# Patient Record
Sex: Female | Born: 1955 | Hispanic: Yes | Marital: Married | Smoking: Never smoker
Health system: Southern US, Community
[De-identification: ages and names within clinical notes are randomized; demographics above are authoritative.]

## PROBLEM LIST (undated history)

## (undated) DIAGNOSIS — E119 Type 2 diabetes mellitus without complications: Secondary | ICD-10-CM

## (undated) DIAGNOSIS — E7800 Pure hypercholesterolemia, unspecified: Secondary | ICD-10-CM

## (undated) DIAGNOSIS — G510 Bell's palsy: Secondary | ICD-10-CM

## (undated) DIAGNOSIS — F419 Anxiety disorder, unspecified: Secondary | ICD-10-CM

## (undated) HISTORY — PX: DILATION AND CURETTAGE OF UTERUS: SHX78

## (undated) HISTORY — PX: APPENDECTOMY: SHX54

## (undated) HISTORY — PX: CHOLECYSTECTOMY: SHX55

---

## 2006-07-09 ENCOUNTER — Emergency Department: Payer: Self-pay | Admitting: Emergency Medicine

## 2006-07-12 ENCOUNTER — Ambulatory Visit: Payer: Self-pay | Admitting: Emergency Medicine

## 2007-01-05 ENCOUNTER — Emergency Department: Payer: Self-pay | Admitting: Emergency Medicine

## 2007-06-06 ENCOUNTER — Emergency Department: Payer: Self-pay | Admitting: Emergency Medicine

## 2011-11-12 ENCOUNTER — Encounter: Payer: Self-pay | Admitting: Nurse Practitioner

## 2011-11-23 ENCOUNTER — Encounter: Payer: Self-pay | Admitting: Nurse Practitioner

## 2011-12-23 ENCOUNTER — Encounter: Payer: Self-pay | Admitting: Nurse Practitioner

## 2012-01-23 ENCOUNTER — Encounter: Payer: Self-pay | Admitting: Nurse Practitioner

## 2013-11-09 DIAGNOSIS — E785 Hyperlipidemia, unspecified: Secondary | ICD-10-CM | POA: Insufficient documentation

## 2013-11-09 DIAGNOSIS — E1169 Type 2 diabetes mellitus with other specified complication: Secondary | ICD-10-CM | POA: Insufficient documentation

## 2013-11-09 DIAGNOSIS — IMO0002 Reserved for concepts with insufficient information to code with codable children: Secondary | ICD-10-CM | POA: Insufficient documentation

## 2013-11-09 DIAGNOSIS — E1140 Type 2 diabetes mellitus with diabetic neuropathy, unspecified: Secondary | ICD-10-CM | POA: Insufficient documentation

## 2013-11-12 ENCOUNTER — Emergency Department: Payer: Self-pay | Admitting: Emergency Medicine

## 2014-10-22 DIAGNOSIS — F325 Major depressive disorder, single episode, in full remission: Secondary | ICD-10-CM | POA: Insufficient documentation

## 2015-05-20 ENCOUNTER — Encounter: Payer: Commercial Indemnity | Attending: Surgery | Admitting: Surgery

## 2015-05-20 DIAGNOSIS — E1140 Type 2 diabetes mellitus with diabetic neuropathy, unspecified: Secondary | ICD-10-CM | POA: Insufficient documentation

## 2015-05-20 DIAGNOSIS — L97312 Non-pressure chronic ulcer of right ankle with fat layer exposed: Secondary | ICD-10-CM | POA: Insufficient documentation

## 2015-05-20 DIAGNOSIS — E11622 Type 2 diabetes mellitus with other skin ulcer: Secondary | ICD-10-CM | POA: Diagnosis not present

## 2015-05-20 DIAGNOSIS — F329 Major depressive disorder, single episode, unspecified: Secondary | ICD-10-CM | POA: Diagnosis not present

## 2015-05-20 DIAGNOSIS — M1990 Unspecified osteoarthritis, unspecified site: Secondary | ICD-10-CM | POA: Diagnosis not present

## 2015-05-20 DIAGNOSIS — I83013 Varicose veins of right lower extremity with ulcer of ankle: Secondary | ICD-10-CM | POA: Insufficient documentation

## 2015-05-20 DIAGNOSIS — E780 Pure hypercholesterolemia: Secondary | ICD-10-CM | POA: Diagnosis not present

## 2015-05-20 NOTE — Progress Notes (Addendum)
JNAI, SNELLGROVE ([id number]) Visit Report for 05/20/2015 Chief Complaint Document Details Patient Name: Tammy Brewer, Tammy T. Date of Service: 05/20/2015 8:15 AM Medical Record Number: [id number] Patient Account Number: [id number] Date of Birth/Sex: 12-14-1955 (59 y.o. Female) Treating RN: Curtis Sites Primary Care Physician: Einar Crow Other Clinician: Referring Physician: Einar Crow Treating Physician/Extender: Rudene Re in Treatment: 0 Information Obtained from: Patient Chief Complaint Patient presents for treatment of an open ulcer due to venous insufficiency. The patient who has diabetes mellitus has had an ulceration on the right ankle for about 3 months Electronic Signature(Tammy) Signed: 05/20/2015 9:15:39 AM By: Evlyn Kanner MD, FACS Entered By: Evlyn Kanner on 05/20/2015 09:15:39 Brull, Esmae T. ([id number]) -------------------------------------------------------------------------------- Debridement Details Patient Name: Tammy Broom T. Date of Service: 05/20/2015 8:15 AM Medical Record Number: [id number] Patient Account Number: [id number] Date of Birth/Sex: 13-Dec-1955 (59 y.o. Female) Treating RN: Curtis Sites Primary Care Physician: Einar Crow Other Clinician: Referring Physician: Einar Crow Treating Physician/Extender: Rudene Re in Treatment: 0 Debridement Performed for Wound #1 Right,Medial Malleolus Assessment: Performed By: Physician Tristan Schroeder., MD Debridement: Debridement Pre-procedure Yes Verification/Time Out Taken: Start Time: 09:05 Pain Control: Other : lidiocaine 4% Level: Skin/Subcutaneous Tissue Total Area Debrided (L x 2.5 (cm) x 1.2 (cm) = 3 (cm) W): Tissue and other Viable, Non-Viable, Exudate, Fibrin/Slough, Subcutaneous material debrided: Instrument: Curette Bleeding: Minimum Hemostasis Achieved: Pressure End Time: 09:10 Procedural Pain: 0 Post Procedural Pain: 0 Response to Treatment:  Procedure was tolerated well Post Debridement Measurements of Total Wound Length: (cm) 2.5 Width: (cm) 1.2 Depth: (cm) 0.2 Volume: (cm) 0.471 Post Procedure Diagnosis Same as Pre-procedure Electronic Signature(Tammy) Signed: 05/20/2015 9:15:09 AM By: Evlyn Kanner MD, FACS Signed: 05/20/2015 5:00:42 PM By: Curtis Sites Entered By: Evlyn Kanner on 05/20/2015 09:15:09 Tammy Brewer, Tammy T. ([id number]) -------------------------------------------------------------------------------- HPI Details Patient Name: Tammy Broom T. Date of Service: 05/20/2015 8:15 AM Medical Record Number: [id number] Patient Account Number: [id number] Date of Birth/Sex: 1956-05-28 (59 y.o. Female) Treating RN: Curtis Sites Primary Care Physician: Einar Crow Other Clinician: Referring Physician: Einar Crow Treating Physician/Extender: Rudene Re in Treatment: 0 History of Present Illness Location: ulcer on the right ankle Quality: Patient reports experiencing burning to affected area(Tammy). Severity: Patient states wound are getting worse. Duration: Patient has had the wound for > 3 months prior to seeking treatment at the wound center Timing: Pain in wound is Intermittent (comes and goes Context: The wound appeared gradually over time Modifying Factors: Patient'Tammy employment requires standing for long periods of time Associated Signs and Symptoms: Patient reports presence of swelling HPI Description: 59 year old patient who comes from Dr. Alva Garnet office with the chief complaints of having a foot ulcer with redness for about 2 weeks. The patient has a past medical history of diabetes mellitus type 2 which is uncontrolled with neuropathy, hypercholesterolemia, osteoarthritis, major depression and has had a past history of appendectomy, cholecystectomy and a DandC. She has never been a smoker and her last hemoglobin A1c done in February 2016 was 8.2. As per our office note she  has recently seen podiatry and also has some vascular workup done recently and saw Dr. Gilda Crease on 04/18/2015. clinically they found varicose veins of the lower extremity with both ulcers of the ankle and inflammation. There was an active ulcer on the right lower extremity. Lower extremity venous duplex exam was recommended and the patient was asked to wear compression stockings. Venous duplex studies are still pending. Electronic Signature(Tammy) Signed: 05/20/2015 9:16:27 AM By: Evlyn Kanner MD, FACS Previous  Signature: 05/20/2015 8:53:36 AM Version By: Evlyn Kanner MD, FACS Previous Signature: 05/20/2015 8:26:56 AM Version By: Evlyn Kanner MD, FACS Entered By: Evlyn Kanner on 05/20/2015 09:16:27 Tammy Brewer, Tammy Brewer ([id number]) -------------------------------------------------------------------------------- Physical Exam Details Patient Name: Tammy Brewer, Tammy T. Date of Service: 05/20/2015 8:15 AM Medical Record Number: [id number] Patient Account Number: [id number] Date of Birth/Sex: 1955/10/31 (59 y.o. Female) Treating RN: Curtis Sites Primary Care Physician: Einar Crow Other Clinician: Referring Physician: Einar Crow Treating Physician/Extender: Rudene Re in Treatment: 0 Constitutional . Pulse regular. Respirations normal and unlabored. Afebrile. . Eyes Nonicteric. Reactive to light. Ears, Nose, Mouth, and Throat Lips, teeth, and gums WNL.Marland Kitchen Moist mucosa without lesions . Neck supple and nontender. No palpable supraclavicular or cervical adenopathy. Normal sized without goiter. Respiratory WNL. No retractions.. Breath sounds WNL, No rubs, rales, rhonchi, or wheeze.. Chest Breasts symmetical and no nipple discharge.. Breast tissue WNL, no masses, lumps, or tenderness.. Gastrointestinal (GI) Abdomen without masses or tenderness.. No liver or spleen enlargement or tenderness.. Lymphatic No adneopathy. No adenopathy. No adenopathy. Musculoskeletal Adexa without  tenderness or enlargement.. Digits and nails w/o clubbing, cyanosis, infection, petechiae, ischemia, or inflammatory conditions.. Integumentary (Hair, Skin) No suspicious lesions. No crepitus or fluctuance. No peri-wound warmth or erythema. No masses.Marland Kitchen Psychiatric Judgement and insight Intact.. No evidence of depression, anxiety, or agitation.. Notes She has got varicose veins which are visible on the right lower extremity and on the right ankle medially she has got an area of ulceration which has some satellite lesions and there is subcutaneous tissue at the depth with slough which nature up to begin with a curette. Electronic Signature(Tammy) Signed: 05/20/2015 9:17:25 AM By: Evlyn Kanner MD, FACS Entered By: Evlyn Kanner on 05/20/2015 09:17:25 Tammy Brewer, Tammy Brewer ([id number]) -------------------------------------------------------------------------------- Physician Orders Details Patient Name: Tammy Broom T. Date of Service: 05/20/2015 8:15 AM Medical Record Number: [id number] Patient Account Number: [id number] Date of Birth/Sex: [birth year] (59 y.o. Female) Treating RN: Huel Coventry Primary Care Physician: Einar Crow Other Clinician: Referring Physician: Einar Crow Treating Physician/Extender: Rudene Re in Treatment: 0 Verbal / Phone Orders: Yes Clinician: Huel Coventry Read Back and Verified: Yes Diagnosis Coding Wound Cleansing Wound #1 Right,Medial Malleolus o Clean wound with Normal Saline. Anesthetic Wound #1 Right,Medial Malleolus o Topical Lidocaine 4% cream applied to wound bed prior to debridement Primary Wound Dressing Wound #1 Right,Medial Malleolus o Aquacel Ag Secondary Dressing Wound #1 Right,Medial Malleolus o ABD pad Dressing Change Frequency Wound #1 Right,Medial Malleolus o Change dressing every week o Other: - Thursday Follow-up Appointments Wound #1 Right,Medial Malleolus o Return Appointment in 1 week. o Nurse Visit  as needed Edema Control Wound #1 Right,Medial Malleolus o Unna Boot to Right Lower Extremity o Support Garment 20-30 mm/Hg pressure to: - Patient to acquire compression garment Additional Orders / Instructions Wound #1 Right,Medial Malleolus o Increase protein intake. EMIRETH, COCKERHAM ([id number]) Services and Therapies o Venous Duplex Doppler - Patient to follow-up with [facility] oooo Electronic Signature(Tammy) Signed: 05/20/2015 4:29:43 PM By: Evlyn Kanner MD, FACS Signed: 05/21/2015 5:26:34 PM By: Elliot Gurney RN, [name], [name], [name] Entered By: Elliot Gurney, RN, [name], Kim on 05/20/2015 09:16:27 Tammy Brewer, Tammy Brewer ([id number]) -------------------------------------------------------------------------------- Problem List Details Patient Name: Tammy Brewer, Tammy T. Date of Service: 05/20/2015 8:15 AM Medical Record Number: [id number] Patient Account Number: [id number] Date of Birth/Sex: Jul 11, 1956 (59 y.o. 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 ([id number]) -------------------------------------------------------------------------------- Progress Note Details Patient Name: Tammy Brewer, Tammy T. Date of Service: 05/20/2015 8:15 AM Medical Record Number: [id number] Patient Account Number:  [id number] Date of Birth/Sex: [birth year] (59 y.o. Female) Treating RN: Curtis Sites Primary Care Physician: Einar Crow Other Clinician: Referring Physician: Einar Crow Treating Physician/Extender: Rudene Re in Treatment: 0 Subjective Chief Complaint Information obtained from Patient Patient presents for treatment of an open ulcer due to venous insufficiency. The patient who has diabetes mellitus has had an ulceration on the right ankle for about 3 months History of Present Illness (HPI) The following HPI elements were documented for the patient'Tammy wound: Location: ulcer on the right ankle Quality: Patient reports experiencing burning to affected area(Tammy). Severity: Patient states wound are getting worse. Duration: Patient has had the wound for > 3 months prior to seeking treatment at the wound center Timing: Pain in wound is Intermittent (comes and goes Context: The wound appeared gradually over time Modifying Factors: Patient'Tammy employment requires standing for long periods of time Associated Signs and Symptoms: Patient reports presence of swelling 59 year old patient who comes from Dr. Gaynell Face Anderson'Tammy office with the chief complaints of having a foot ulcer with redness for about 2 weeks. The patient has a past medical history of diabetes mellitus type 2 which is uncontrolled with neuropathy, hypercholesterolemia, osteoarthritis, major depression and has had a past history of appendectomy, cholecystectomy and a DandC. She has never been a smoker and her last hemoglobin A1c done in February 2016 was 8.2. As per our office note she has recently seen podiatry and also has some vascular workup done recently and saw Dr. Gilda Crease on 04/18/2015. clinically they found varicose veins of the lower extremity with both ulcers of the ankle and inflammation. There was an active ulcer on the right lower extremity. Lower extremity venous duplex exam was recommended and the  patient was asked to wear compression stockings. Venous duplex studies are still pending. Wound History Patient presents with 1 open wound that has been present for approximately 3 months. Patient has been treating wound in the following manner: dry dressing. Laboratory tests have been performed in the last month. Patient reportedly has not tested positive for an antibiotic resistant organism. Patient reportedly has not tested positive for osteomyelitis. Patient reportedly has not had testing performed to evaluate circulation in the legs. Patient History Information obtained from Patient. KELBY, LOTSPEICH ([id number]) Allergies No Known Allergies Family History No family history of Cancer, Diabetes, Heart Disease, Hereditary Spherocytosis, Hypertension, Kidney Disease, Lung Disease, Seizures, Stroke, Thyroid Problems, Tuberculosis. Social History Never smoker, Marital Status - Married, Alcohol Use - Never, Drug Use - No History, Caffeine Use - Never. Medical History Endocrine Patient has history of Type II Diabetes Musculoskeletal Patient has history of Osteoarthritis Neurologic Patient has history of Neuropathy Oncologic Denies history of Received Chemotherapy, Received Radiation Patient is treated with Oral Agents. Medical And Surgical History Notes Cardiovascular hypercholesterolemia Review of Systems (ROS) Constitutional Symptoms (General Health) The patient has no complaints or symptoms. Eyes The patient has no complaints or symptoms. Ear/Nose/Mouth/Throat The patient has no  complaints or symptoms. Hematologic/Lymphatic The patient has no complaints or symptoms. Respiratory The patient has no complaints or symptoms. Cardiovascular The patient has no complaints or symptoms. Gastrointestinal The patient has no complaints or symptoms. Genitourinary The patient has no complaints or symptoms. Immunological The patient has no complaints or symptoms. Integumentary  (Skin) The patient has no complaints or symptoms. Neurologic The patient has no complaints or symptoms. Oncologic Tammy Brewer, BALON T. ([id number]) The patient has no complaints or symptoms. Psychiatric The patient has no complaints or symptoms. Medications tramadol 50 mg tablet oral 1 1 tablet oral every six hours as needed for pain metformin 500 mg tablet oral 1 1 tablet oral twice daily Objective Constitutional Pulse regular. Respirations normal and unlabored. Afebrile. Vitals Time Taken: 8:20 AM, Height: 61 in, Source: Stated, Weight: 160 lbs, Source: Stated, BMI: 30.2, Temperature: 98.2 F, Pulse: 74 bpm, Respiratory Rate: 18 breaths/min, Blood Pressure: 128/75 mmHg. Eyes Nonicteric. Reactive to light. Ears, Nose, Mouth, and Throat Lips, teeth, and gums WNL.Marland Kitchen Moist mucosa without lesions . Neck supple and nontender. No palpable supraclavicular or cervical adenopathy. Normal sized without goiter. Respiratory WNL. No retractions.. Breath sounds WNL, No rubs, rales, rhonchi, or wheeze.. Chest Breasts symmetical and no nipple discharge.. Breast tissue WNL, no masses, lumps, or tenderness.. Gastrointestinal (GI) Abdomen without masses or tenderness.. No liver or spleen enlargement or tenderness.. Lymphatic No adneopathy. No adenopathy. No adenopathy. Musculoskeletal Adexa without tenderness or enlargement.. Digits and nails w/o clubbing, cyanosis, infection, petechiae, ischemia, or inflammatory conditions.Marland Kitchen SHENIAH, SUPAK ([id number]) Psychiatric Judgement and insight Intact.. No evidence of depression, anxiety, or agitation.. General Notes: She has got varicose veins which are visible on the right lower extremity and on the right ankle medially she has got an area of ulceration which has some satellite lesions and there is subcutaneous tissue at the depth with slough which nature up to begin with a curette. Integumentary (Hair, Skin) No suspicious lesions. No crepitus or  fluctuance. No peri-wound warmth or erythema. No masses.. Wound #1 status is Open. Original cause of wound was Pimple. The wound is located on the Right,Medial Malleolus. The wound measures 2.5cm length x 1.2cm width x 0.2cm depth; 2.356cm^2 area and 0.471cm^3 volume. The wound is limited to skin breakdown. There is no tunneling or undermining noted. There is a medium amount of serous drainage noted. The wound margin is flat and intact. There is no granulation within the wound bed. There is a large (67-100%) amount of necrotic tissue within the wound bed including Eschar and Adherent Slough. The periwound skin appearance exhibited: Moist. The periwound skin appearance did not exhibit: Callus, Crepitus, Excoriation, Fluctuance, Friable, Induration, Localized Edema, Rash, Scarring, Dry/Scaly, Maceration, Atrophie Blanche, Cyanosis, Ecchymosis, Hemosiderin Staining, Mottled, Pallor, Rubor, Erythema. Periwound temperature was noted as No Abnormality. 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 She has had a vascular opinion but not caught her venous duplex done yet. I have recommended elevation of the limb, getting a vascular test done, proper control of her diabetes mellitus, local wound care and compression. She will have an wound was wrapped with primary dressing of silver alginate and all these details have been  discussed with her via an interpreter. She will come to see me in regular basis and will be back here next week. Procedures LIANDRA, MENDIA T. ([id number]) Wound #1 Wound #1 is a Diabetic Wound/Ulcer of the  Lower Extremity located on the Right,Medial Malleolus . There was a Skin/Subcutaneous Tissue Debridement (40981-19147) debridement with total area of 3 sq cm performed by Shaquasha Gerstel, Ignacia Felling., MD. with the following instrument(Tammy): Curette to remove Viable and Non-Viable tissue/material including Exudate, Fibrin/Slough, and Subcutaneous after achieving pain control using Other (lidiocaine 4%). A time out was conducted prior to the start of the procedure. A Minimum amount of bleeding was controlled with Pressure. The procedure was tolerated well with a pain level of 0 throughout and a pain level of 0 following the procedure. Post Debridement Measurements: 2.5cm length x 1.2cm width x 0.2cm depth; 0.471cm^3 volume. Post procedure Diagnosis Wound #1: Same as Pre-Procedure Plan Wound Cleansing: Wound #1 Right,Medial Malleolus: Clean wound with Normal Saline. Anesthetic: Wound #1 Right,Medial Malleolus: Topical Lidocaine 4% cream applied to wound bed prior to debridement Primary Wound Dressing: Wound #1 Right,Medial Malleolus: Aquacel Ag Secondary Dressing: Wound #1 Right,Medial Malleolus: ABD pad Dressing Change Frequency: Wound #1 Right,Medial Malleolus: Change dressing every week Other: - Thursday Follow-up Appointments: Wound #1 Right,Medial Malleolus: Return Appointment in 1 week. Nurse Visit as needed Edema Control: Wound #1 Right,Medial Malleolus: Unna Boot to Right Lower Extremity Support Garment 20-30 mm/Hg pressure to: - Patient to acquire compression garment Additional Orders / Instructions: Wound #1 Right,Medial Malleolus: Increase protein intake. Services and Therapies ordered were: Venous Duplex Doppler - Patient to follow-up with [facility] Tammy Brewer, Tammy Brewer ([id number]) This 59 year old patient has had varicose veins for the last 35 years and has signs and stigmata of venous hypertension with eczema and ulceration especially on the right lower extremity. She has had a  vascular opinion but not caught her venous duplex done yet. I have recommended elevation of the limb, getting a vascular test done, proper control of her diabetes mellitus, local wound care and compression. She will have an wound was wrapped with primary dressing of silver alginate and all these details have been discussed with her via an interpreter. She will come to see me in regular basis and will be back here next week. Electronic Signature(Tammy) Signed: 05/20/2015 9:19:47 AM By: Evlyn Kanner MD, FACS Entered By: Evlyn Kanner on 05/20/2015 09:19:47 Fujikawa, Tammy Brewer ([id number]) -------------------------------------------------------------------------------- ROS/PFSH Details Patient Name: Tammy Broom T. Date of Service: 05/20/2015 8:15 AM Medical Record Number: [id number] Patient Account Number: [id number] Date of Birth/Sex: [birth year] (59 y.o. Female) Treating RN: Curtis Sites Primary Care Physician: Einar Crow Other Clinician: Referring Physician: Einar Crow Treating Physician/Extender: Rudene Re in Treatment: 0 Information Obtained From Patient Wound History Do you currently have one or more open woundso Yes How many open wounds do you currently haveo 1 Approximately how long have you had your woundso 3 months How have you been treating your wound(Tammy) until nowo dry dressing Has your wound(Tammy) ever healed and then re-openedo No Have you had any lab work done in the past montho Yes Who ordered the lab work doneo PCP Have you tested positive for an antibiotic resistant organism (MRSA, VRE)o No Have you tested positive for osteomyelitis (bone infection)o No Have you had any tests for circulation on your legso No Constitutional Symptoms (General Health) Complaints and Symptoms: No Complaints or Symptoms Eyes Complaints and Symptoms: No Complaints or Symptoms Ear/Nose/Mouth/Throat Complaints and Symptoms: No Complaints or  Symptoms Hematologic/Lymphatic Complaints  and Symptoms: No Complaints or Symptoms Respiratory Complaints and Symptoms: No Complaints or Symptoms Cardiovascular Complaints and Symptoms: No Complaints or Symptoms DYANARA, COZZA. ([id number]) Medical History: Past Medical History Notes: hypercholesterolemia Gastrointestinal Complaints and Symptoms: No Complaints or Symptoms Endocrine Medical History: Positive for: Type II Diabetes Time with diabetes: 10 years Treated with: Oral agents Genitourinary Complaints and Symptoms: No Complaints or Symptoms Immunological Complaints and Symptoms: No Complaints or Symptoms Integumentary (Skin) Complaints and Symptoms: No Complaints or Symptoms Musculoskeletal Medical History: Positive for: Osteoarthritis Neurologic Complaints and Symptoms: No Complaints or Symptoms Medical History: Positive for: Neuropathy Oncologic Complaints and Symptoms: No Complaints or Symptoms Medical HistoryTAILA, BASINSKI ([id number]) Negative for: Received Chemotherapy; Received Radiation Psychiatric Complaints and Symptoms: No Complaints or Symptoms Family and Social History Cancer: No; Diabetes: No; Heart Disease: No; Hereditary Spherocytosis: No; Hypertension: No; Kidney Disease: No; Lung Disease: No; Seizures: No; Stroke: No; Thyroid Problems: No; Tuberculosis: No; Never smoker; Marital Status - Married; Alcohol Use: Never; Drug Use: No History; Caffeine Use: Never; Financial Concerns: No; Food, Clothing or Shelter Needs: No; Support System Lacking: No; Transportation Concerns: No; Advanced Directives: No; Patient does not want information on Advanced Directives Physician Affirmation I have reviewed and agree with the above information. Electronic Signature(Tammy) Signed: 05/20/2015 8:53:50 AM By: Evlyn Kanner MD, FACS Signed: 05/20/2015 5:00:42 PM By: Curtis Sites Entered By: Evlyn Kanner on 05/20/2015 08:53:50 Laskin, Arnesia T.  ([id number]) -------------------------------------------------------------------------------- SuperBill Details Patient Name: Tammy Broom T. Date of Service: 05/20/2015 Medical Record Number: [id number] Patient Account Number: [id number] Date of Birth/Sex: Jan 10, 1956 (59 y.o. 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 Facility Procedures CPT4 Code Description: [id number] - WOUND CARE VISIT-LEV 3 EST PT Modifier: Quantity: 1 CPT4 Code Description: [id number] 11042 - DEB SUBQ TISSUE 20 SQ CM/< ICD-10 Description Diagnosis E11.622 Type 2 diabetes mellitus with other skin ulcer I83.013 Varicose veins of right lower extremity with ulcer L97.312 Non-pressure chronic ulcer of right  ankle with fat Modifier: of ankle layer expose Quantity: 1 d Physician Procedures CPT4 Code Description: 7253664 99204 - WC PHYS LEVEL 4 - NEW PT ICD-10 Description Diagnosis E11.622 Type 2 diabetes mellitus with other skin ulcer 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 ([id number]) Modifier: of ankle layer expose Quantity: 1 d Electronic Signature(Tammy) Signed: 05/20/2015 9:20:05 AM By: Evlyn Kanner MD,  FACS Entered By: Evlyn Kanner on 05/20/2015 09:20:05

## 2015-05-21 NOTE — Progress Notes (Signed)
CELE, MOTE ([id number]) Visit Report for 05/20/2015 Abuse/Suicide Risk Screen Details Patient Name: [name] Date of Service: 05/20/2015 8:15 AM Medical Record Number: [id number] Patient Account Number: [id number] Date of Birth/Sex: 1956-06-19 (59 y.o. Female) Treating RN: Tammy Brewer Primary Care Physician: Tammy Brewer Other Clinician: Referring Physician: Einar Brewer Treating Physician/Extender: Tammy Brewer: 0 Abuse/Suicide Risk Screen Items Answer ABUSE/SUICIDE RISK SCREEN: Has anyone close to you tried to hurt or harm you recentlyo No Do you feel uncomfortable with anyone in your familyo No Has anyone forced you do things that you didnot want to doo No Do you have any thoughts of harming yourselfo No Patient displays signs or symptoms of abuse and/or neglect. No Electronic Signature(s) Signed: 05/20/2015 5:00:42 PM By: Tammy Brewer Entered By: Tammy Brewer on 05/20/2015 08:30:25 [name] ([id number]) -------------------------------------------------------------------------------- Activities of Daily Living Details Patient Name: [name] Date of Service: 05/20/2015 8:15 AM Medical Record Number: [id number] Patient Account Number: [id number] Date of Birth/Sex: 1956-03-09 (59 y.o. Female) Treating RN: Tammy Brewer Primary Care Physician: Tammy Brewer Other Clinician: Referring Physician: Einar Brewer Treating Physician/Extender: Tammy Brewer: 0 Activities of Daily Living Items Answer Activities of Daily Living (Please select one for each item) Drive Automobile Completely Able Take Medications Completely Able Use Telephone Completely Able Care for Appearance Completely Able Use Toilet Completely Able Bath / Shower Completely Able Dress Self Completely Able Feed Self Completely Able Walk Completely Able Get In / Out Bed Completely Able Housework Completely Able Prepare  Meals Completely Able Handle Money Completely Able Shop for Self Completely Able Electronic Signature(s) Signed: 05/20/2015 5:00:42 PM By: Tammy Brewer Entered By: Tammy Brewer on 05/20/2015 08:30:47 [name] ([id number]) -------------------------------------------------------------------------------- Education Assessment Details Patient Name: [name] Date of Service: 05/20/2015 8:15 AM Medical Record Number: [id number] Patient Account Number: [id number] Date of Birth/Sex: 1956-07-16 (59 y.o. Female) Treating RN: Tammy Brewer Primary Care Physician: Tammy Brewer Other Clinician: Referring Physician: Einar Brewer Treating Physician/Extender: Tammy Brewer: 0 Primary Learner Assessed: Patient Learning Preferences/Education Level/Primary Language Learning Preference: Explanation, Demonstration Highest Education Level: High School Preferred Language: Spanish Cognitive Barrier Assessment/Beliefs Language Barrier: Yes Translator Needed: Yes Contract Interpreting Service Memory Deficit: No Emotional Barrier: No Cultural/Religious Beliefs Affecting Medical No Care: Physical Barrier Assessment Impaired Vision: No Impaired Hearing: No Decreased Hand dexterity: No Knowledge/Comprehension Assessment Knowledge Level: Medium Comprehension Level: Medium Ability to understand written Medium instructions: Ability to understand verbal Medium instructions: Motivation Assessment Anxiety Level: Calm Cooperation: Cooperative Education Importance: Acknowledges Need Interest in Health Problems: Asks Questions Perception: Coherent Willingness to Engage in Self- Medium Management Activities: Readiness to Engage in Self- Medium Management Activities: Electronic Signature(s) Tammy Brewer ([id number]) Signed: 05/20/2015 5:00:42 PM By: Tammy Brewer Entered By: Tammy Brewer on 05/20/2015 08:31:30 Tammy Brewer  ([id number]) -------------------------------------------------------------------------------- Fall Risk Assessment Details Patient Name: [name] Date of Service: 05/20/2015 8:15 AM Medical Record Number: [id number] Patient Account Number: [id number] Date of Birth/Sex: 02-05-1956 (59 y.o. Female) Treating RN: Tammy Brewer Primary Care Physician: Tammy Brewer Other Clinician: Referring Physician: Einar Brewer Treating Physician/Extender: Tammy Brewer: 0 Fall Risk Assessment Items FALL RISK ASSESSMENT: History of falling - immediate or within 3 months 0 No Secondary diagnosis 0 No Ambulatory aid None/bed rest/wheelchair/nurse 0 Yes Crutches/cane/walker 0 No Furniture 0 No IV Access/Saline Lock 0 No Gait/Training Normal/bed rest/immobile 0 Yes Weak 0 No Impaired 0 No Mental Status Oriented to own ability 0 Yes Electronic Signature(s)  Signed: 05/20/2015 5:00:42 PM By: Tammy Brewer Entered By: Tammy Brewer on 05/20/2015 08:31:40 Tammy Brewer, Tammy Brewer Kitchen ([id number]) -------------------------------------------------------------------------------- Foot Assessment Details Patient Name: [name] Date of Service: 05/20/2015 8:15 AM Medical Record Number: [id number] Patient Account Number: [id number] Date of Birth/Sex: 31-Aug-1955 (59 y.o. Female) Treating RN: Tammy Brewer Primary Care Physician: Tammy Brewer Other Clinician: Referring Physician: Einar Brewer Treating Physician/Extender: Tammy Brewer: 0 Foot Assessment Items Site Locations + = Sensation present, - = Sensation absent, C = Callus, U = Ulcer R = Redness, W = Warmth, M = Maceration, PU = Pre-ulcerative lesion F = Fissure, S = Swelling, D = Dryness Assessment Right: Left: Other Deformity: No No Prior Foot Ulcer: No No Prior Amputation: No No Charcot Joint: No No Ambulatory Status: Ambulatory Without Help Gait: Steady Electronic  Signature(s) Signed: 05/20/2015 5:00:42 PM By: Tammy Brewer Entered By: Tammy Brewer on 05/20/2015 08:54:36 [name] ([id number]) -------------------------------------------------------------------------------- Nutrition Risk Assessment Details Patient Name: [name] Date of Service: 05/20/2015 8:15 AM Medical Record Number: [id number] Patient Account Number: [id number] Date of Birth/Sex: [birth year] (59 y.o. Female) Treating RN: Tammy Brewer Primary Care Physician: Tammy Brewer Other Clinician: Referring Physician: Einar Brewer Treating Physician/Extender: Tammy Brewer: 0 Height (in): 61 Weight (lbs): 160 Body Mass Index (BMI): 30.2 Nutrition Risk Assessment Items NUTRITION RISK SCREEN: I have an illness or condition that made me change the kind and/or 0 No amount of food I eat I eat fewer than two meals per day 0 No I eat few fruits and vegetables, or milk products 0 No I have three or more drinks of beer, liquor or wine almost every day 0 No I have tooth or mouth problems that make it hard for me to eat 0 No I don'Brewer always have enough money to buy the food I need 0 No I eat alone most of the time 0 No I take three or more different prescribed or over-the-counter drugs a 1 Yes day Without wanting to, I have lost or gained 10 pounds in the last six 0 No months I am not always physically able to shop, cook and/or feed myself 0 No Nutrition Protocols Good Risk Protocol 0 No interventions needed Moderate Risk Protocol Electronic Signature(s) Signed: 05/20/2015 5:00:42 PM By: Tammy Brewer Entered By: Tammy Brewer on 05/20/2015 08:31:49

## 2015-05-22 NOTE — Progress Notes (Signed)
Tammy, Brewer ([id number]) Visit Report for 05/20/2015 Allergy List Details Patient Name: Tammy Brewer, Tammy T. Date of Service: 05/20/2015 8:15 AM Medical Record Number: [id number] Patient Account Number: [id number] Date of Birth/Sex: 1955-09-02 (59 y.o. Female) Treating RN: Curtis Sites Primary Care Physician: Einar Crow Other Clinician: Referring Physician: Einar Crow Treating Physician/Extender: Rudene Re in Treatment: 0 Allergies Active Allergies No Known Allergies Allergy Notes Electronic Signature(s) Signed: 05/20/2015 5:00:42 PM By: Curtis Sites Entered By: Curtis Sites on 05/20/2015 08:38:33 Ngo, Neeley TMarland Kitchen ([id number]) -------------------------------------------------------------------------------- Arrival Information Details Patient Name: Tammy Broom T. Date of Service: 05/20/2015 8:15 AM Medical Record Number: [id number] Patient Account Number: [id number] Date of Birth/Sex: [birth year] (59 y.o. Female) Treating RN: Curtis Sites Primary Care Physician: Einar Crow Other Clinician: Referring Physician: Einar Crow Treating Physician/Extender: Rudene Re in Treatment: 0 Visit Information Patient Arrived: Ambulatory Arrival Time: 08:19 Accompanied By: self Transfer Assistance: None Patient Identification Verified: Yes Secondary Verification Process Yes Completed: Patient Has Alerts: Yes Patient Alerts: DMII Electronic Signature(s) Signed: 05/20/2015 5:00:42 PM By: Curtis Sites Entered By: Curtis Sites on 05/20/2015 08:20:31 Ruggles, Ianna T. ([id number]) -------------------------------------------------------------------------------- Clinic Level of Care Assessment Details Patient Name: Tammy Broom T. Date of Service: 05/20/2015 8:15 AM Medical Record Number: [id number] Patient Account Number: [id number] Date of Birth/Sex: [birth year] (59 y.o. Female) Treating RN: Huel Coventry Primary Care Physician: Einar Crow Other Clinician: Referring Physician: Einar Crow Treating Physician/Extender: Rudene Re in Treatment: 0 Clinic Level of Care Assessment Items TOOL 1 Quantity Score []  - Use when EandM and Procedure is performed on INITIAL visit 0 ASSESSMENTS - Nursing Assessment / Reassessment X - General Physical Exam (combine w/ comprehensive assessment (listed just 1 20 below) when performed on new pt. evals) X - Comprehensive Assessment (HX, ROS, Risk Assessments, Wounds Hx, etc.) 1 25 ASSESSMENTS - Wound and Skin Assessment / Reassessment []  - Dermatologic / Skin Assessment (not related to wound area) 0 ASSESSMENTS - Ostomy and/or Continence Assessment and Care []  - Incontinence Assessment and Management 0 []  - Ostomy Care Assessment and Management (repouching, etc.) 0 PROCESS - Coordination of Care X - Simple Patient / Family Education for ongoing care 1 15 []  - Complex (extensive) Patient / Family Education for ongoing care 0 X - Staff obtains Chiropractor, Records, Test Results / Process Orders 1 10 []  - Staff telephones HHA, Nursing Homes / Clarify orders / etc 0 []  - Routine Transfer to another Facility (non-emergent condition) 0 []  - Routine Hospital Admission (non-emergent condition) 0 X - New Admissions / Manufacturing engineer / Ordering NPWT, Apligraf, etc. 1 15 []  - Emergency Hospital Admission (emergent condition) 0 PROCESS - Special Needs []  - Pediatric / Minor Patient Management 0 []  - Isolation Patient Management 0 Eatherly, Bailey T. ([id number]) []  - Hearing / Language / Visual special needs 0 []  - Assessment of Community assistance (transportation, D/C planning, etc.) 0 []  - Additional assistance / Altered mentation 0 []  - Support Surface(s) Assessment (bed, cushion, seat, etc.) 0 INTERVENTIONS - Miscellaneous []  - External ear exam 0 []  - Patient Transfer (multiple staff / Nurse, adult / Similar devices) 0 []  - Simple Staple / Suture removal (25 or  less) 0 []  - Complex Staple / Suture removal (26 or more) 0 []  - Hypo/Hyperglycemic Management (do not check if billed separately) 0 X - Ankle / Brachial Index (ABI) - do not check if billed separately 1 15 Has the patient been seen at the hospital within the last three years: Yes Total Score: 100  Level Of Care: New/Established - Level 3 Electronic Signature(s) Signed: 05/21/2015 5:26:34 PM By: Elliot Gurney, RN, [name], [name], [name] Entered By: Elliot Gurney, RN, [name], Kim on 05/20/2015 09:17:38 Utt, Grandville Silos ([id number]) -------------------------------------------------------------------------------- Encounter Discharge Information Details Patient Name: Tammy, Brewer T. Date of Service: 05/20/2015 8:15 AM Medical Record Number: [id number] Patient Account Number: [id number] Date of Birth/Sex: 09-13-1955 (59 y.o. Female) Treating RN: Curtis Sites Primary Care Physician: Einar Crow Other Clinician: Referring Physician: Einar Crow Treating Physician/Extender: Rudene Re in Treatment: 0 Encounter Discharge Information Items Discharge Pain Level: 0 Discharge Condition: Stable Ambulatory Status: Ambulatory Discharge Destination: Home Transportation: Private Auto Accompanied By: self Schedule Follow-up Appointment: Yes Medication Reconciliation completed and provided to Patient/Care No Aritzel Krusemark: Provided on Clinical Summary of Care: 05/20/2015 Form Type Recipient Paper Patient MG Electronic Signature(s) Signed: 05/20/2015 9:28:40 AM By: Gwenlyn Perking Entered By: Gwenlyn Perking on 05/20/2015 09:28:40 Gaw, Ashleynicole T. ([id number]) -------------------------------------------------------------------------------- Lower Extremity Assessment Details Patient Name: [name], [name] Date of Service: 05/20/2015 8:15 AM Medical Record Number: [id number] Patient Account Number: [id number] Date of Birth/Sex: 20-Mar-1956 (59 y.o. Female) Treating RN: Curtis Sites Primary Care Physician:  Einar Crow Other Clinician: Referring Physician: Einar Crow Treating Physician/Extender: Rudene Re in Treatment: 0 Edema Assessment Assessed: [Left: No] [Right: No] E[Left: dema] [Right: :] Calf Left: Right: Point of Measurement: 30 cm From Medial Instep 36.2 cm 35.7 cm Ankle Left: Right: Point of Measurement: 10 cm From Medial Instep 20.1 cm 19.6 cm Vascular Assessment Pulses: Posterior Tibial Palpable: [Left:No] [Right:No] Doppler: [Left:Monophasic] [Right:Monophasic] Dorsalis Pedis Palpable: [Left:Yes] [Right:Yes] Doppler: [Left:Multiphasic] [Right:Multiphasic] Extremity colors, hair growth, and conditions: Extremity Color: [Left:Normal] [Right:Hyperpigmented] Hair Growth on Extremity: [Left:Yes] [Right:Yes] Temperature of Extremity: [Left:Warm] [Right:Warm] Capillary Refill: [Left:< 3 seconds] [Right:< 3 seconds] Blood Pressure: Brachial: [Right:122] Dorsalis Pedis: 106 [Left:Dorsalis Pedis: 132] Ankle: Posterior Tibial: 118 [Left:Posterior Tibial: 128 0.97] [Right:1.08] Toe Nail Assessment Left: Right: Thick: Yes Yes Discolored: Yes Yes Deformed: No No Improper Length and Hygiene: No No BRITTNYE, JOSEPHS ([id number]) Electronic Signature(s) Signed: 05/20/2015 5:00:42 PM By: Curtis Sites Entered By: Curtis Sites on 05/20/2015 08:53:01 Albrecht, Tahjae T. ([id number]) -------------------------------------------------------------------------------- Multi Wound Chart Details Patient Name: Tammy Broom T. Date of Service: 05/20/2015 8:15 AM Medical Record Number: [id number] Patient Account Number: [id number] Date of Birth/Sex: Dec 28, 1955 (59 y.o. Female) Treating RN: Huel Coventry Primary Care Physician: Einar Crow Other Clinician: Referring Physician: Einar Crow Treating Physician/Extender: Rudene Re in Treatment: 0 Vital Signs Height(in): 61 Pulse(bpm): 74 Weight(lbs): 160 Blood Pressure 128/75 (mmHg): Body  Mass Index(BMI): 30 Temperature(F): 98.2 Respiratory Rate 18 (breaths/min): Photos: [1:No Photos] [N/A:N/A] Wound Location: [1:Right Malleolus - Medial N/A] Wounding Event: [1:Pimple] [N/A:N/A] Primary Etiology: [1:Diabetic Wound/Ulcer of N/A the Lower Extremity] Comorbid History: [1:Type II Diabetes, Osteoarthritis, Neuropathy] [N/A:N/A] Date Acquired: [1:02/18/2015] [N/A:N/A] Weeks of Treatment: [1:0] [N/A:N/A] Wound Status: [1:Open] [N/A:N/A] Measurements L x W x D 0.7x0.6x0.2 [N/A:N/A] (cm) Area (cm) : [1:0.33] [N/A:N/A] Volume (cm) : [1:0.066] [N/A:N/A] % Reduction in Area: [1:0.00%] [N/A:N/A] % Reduction in Volume: 0.00% [N/A:N/A] Classification: [1:Grade 1] [N/A:N/A] Exudate Amount: [1:Medium] [N/A:N/A] Exudate Type: [1:Serous] [N/A:N/A] Exudate Color: [1:amber] [N/A:N/A] Wound Margin: [1:Flat and Intact] [N/A:N/A] Granulation Amount: [1:None Present (0%)] [N/A:N/A] Necrotic Amount: [1:Large (67-100%)] [N/A:N/A] Necrotic Tissue: [1:Eschar, Adherent Slough N/A] Exposed Structures: [1:Fascia: No Fat: No Tendon: No Muscle: No Joint: No Bone: No] [N/A:N/A] Limited to Skin Breakdown Epithelialization: None N/A N/A Periwound Skin Texture: Edema: No N/A N/A Excoriation: No Induration: No Callus: No Crepitus: No Fluctuance: No Friable: No Rash: No Scarring:  No Periwound Skin Moist: Yes N/A N/A Moisture: Maceration: No Dry/Scaly: No Periwound Skin Color: Atrophie Blanche: No N/A N/A Cyanosis: No Ecchymosis: No Erythema: No Hemosiderin Staining: No Mottled: No Pallor: No Rubor: No Temperature: No Abnormality N/A N/A Tenderness on No N/A N/A Palpation: Wound Preparation: Ulcer Cleansing: N/A N/A Rinsed/Irrigated with Saline Topical Anesthetic Applied: Other: lidocaine 4% Treatment Notes Electronic Signature(s) Signed: 05/21/2015 5:26:34 PM By: Elliot Gurney, RN, [name], [name], [name] Entered By: Elliot Gurney, RN, [name], Kim on 05/20/2015 09:06:08 Dot Been  ([id number]) -------------------------------------------------------------------------------- Multi-Disciplinary Care Plan Details Patient Name: INDEA, DEARMAN T. Date of Service: 05/20/2015 8:15 AM Medical Record Number: [id number] Patient Account Number: [id number] Date of Birth/Sex: [birth year] (59 y.o. Female) Treating RN: Huel Coventry Primary Care Physician: Einar Crow Other Clinician: Referring Physician: Einar Crow Treating Physician/Extender: Rudene Re in Treatment: 0 Active Inactive Orientation to the Wound Care Program Nursing Diagnoses: Knowledge deficit related to the wound healing center program Goals: Patient/caregiver will verbalize understanding of the Wound Healing Center Program Date Initiated: 05/20/2015 Goal Status: Active Interventions: Provide education on orientation to the wound center Notes: Wound/Skin Impairment Nursing Diagnoses: Impaired tissue integrity Goals: Patient/caregiver will verbalize understanding of skin care regimen Date Initiated: 05/20/2015 Goal Status: Active Ulcer/skin breakdown will heal within 14 weeks Date Initiated: 05/20/2015 Goal Status: Active Interventions: Assess patient/caregiver ability to perform ulcer/skin care regimen upon admission and as needed Provide education on ulcer and skin care Treatment Activities: Topical wound management initiated : 05/20/2015 Notes: CHERISE, FEDDER ([id number]) Electronic Signature(s) Signed: 05/21/2015 5:26:34 PM By: Elliot Gurney, RN, [name], [name], [name] Entered By: Elliot Gurney, RN, [name], Kim on 05/20/2015 09:05:54 Mcquinn, Grandville Silos ([id number]) -------------------------------------------------------------------------------- Patient/Caregiver Education Details Patient Name: Tammy Broom T. Date of Service: 05/20/2015 8:15 AM Medical Record Number: [id number] Patient Account Number: [id number] Date of Birth/Gender: 04/15/1956 (58 y.o. Female) Treating RN: Curtis Sites Primary Care  Physician: Einar Crow Other Clinician: Referring Physician: Einar Crow Treating Physician/Extender: Rudene Re in Treatment: 0 Education Assessment Education Provided To: Patient Education Topics Provided Venous: Handouts: Controlling Swelling with Multilayered Compression Wraps Methods: Printed Responses: State content correctly Wound/Skin Impairment: Handouts: Other: wound healing and debridement Methods: Demonstration, Explain/Verbal Responses: State content correctly Electronic Signature(s) Signed: 05/20/2015 5:00:42 PM By: Curtis Sites Entered By: Curtis Sites on 05/20/2015 09:28:44 Lodge, Tondalaya T. ([id number]) -------------------------------------------------------------------------------- Wound Assessment Details Patient Name: Mcmurphy, Charnell T. Date of Service: 05/20/2015 8:15 AM Medical Record Number: [id number] Patient Account Number: [id number] Date of Birth/Sex: [birth year] (59 y.o. Female) Treating RN: Huel Coventry Primary Care Physician: Einar Crow Other Clinician: Referring Physician: Einar Crow Treating Physician/Extender: Rudene Re in Treatment: 0 Wound Status Wound Number: 1 Primary Diabetic Wound/Ulcer of the Lower Etiology: Extremity Wound Location: Right, Medial Malleolus Wound Status: Open Wounding Event: Pimple Comorbid Type II Diabetes, Osteoarthritis, Date Acquired: 02/18/2015 History: Neuropathy Weeks Of Treatment: 0 Clustered Wound: No Photos Photo Uploaded By: Curtis Sites on 05/20/2015 09:42:49 Wound Measurements Length: (cm) 2.5 Width: (cm) 1.2 Depth: (cm) 0.2 Area: (cm) 2.356 Volume: (cm) 0.471 % Reduction in Area: -613.9% % Reduction in Volume: -613.6% Epithelialization: None Tunneling: No Undermining: No Wound Description Classification: Grade 1 Wound Margin: Flat and Intact Exudate Amount: Medium Exudate Type: Serous Exudate Color: amber Foul Odor After Cleansing:  No Wound Bed Granulation Amount: None Present (0%) Exposed Structure Necrotic Amount: Large (67-100%) Fascia Exposed: No Necrotic Quality: Eschar, Adherent Slough Fat Layer Exposed: No Tendon Exposed: No Macera, Karalina T. ([id number]) Muscle Exposed: No Joint Exposed: No Bone Exposed: No Limited  to Skin Breakdown Periwound Skin Texture Texture Color No Abnormalities Noted: No No Abnormalities Noted: No Callus: No Atrophie Blanche: No Crepitus: No Cyanosis: No Excoriation: No Ecchymosis: No Fluctuance: No Erythema: No Friable: No Hemosiderin Staining: No Induration: No Mottled: No Localized Edema: No Pallor: No Rash: No Rubor: No Scarring: No Temperature / Pain Moisture Temperature: No Abnormality No Abnormalities Noted: No Dry / Scaly: No Maceration: No Moist: Yes Wound Preparation Ulcer Cleansing: Rinsed/Irrigated with Saline Topical Anesthetic Applied: Other: lidocaine 4%, Electronic Signature(s) Signed: 05/21/2015 5:26:34 PM By: Elliot Gurney, RN, [name], [name], [name] Entered By: Elliot Gurney, RN, [name], Kim on 05/20/2015 16:10:96 Dot Been ([id number]) -------------------------------------------------------------------------------- Vitals Details Patient Name: Tammy Broom T. Date of Service: 05/20/2015 8:15 AM Medical Record Number: [id number] Patient Account Number: [id number] Date of Birth/Sex: 01-27-1956 (59 y.o. Female) Treating RN: Curtis Sites Primary Care Physician: Einar Crow Other Clinician: Referring Physician: Einar Crow Treating Physician/Extender: Rudene Re in Treatment: 0 Vital Signs Time Taken: 08:20 Temperature (F): 98.2 Height (in): 61 Pulse (bpm): 74 Source: Stated Respiratory Rate (breaths/min): 18 Weight (lbs): 160 Blood Pressure (mmHg): 128/75 Source: Stated Reference Range: 80 - 120 mg / dl Body Mass Index (BMI): 30.2 Electronic Signature(s) Signed: 05/20/2015 5:00:42 PM By: Curtis Sites Entered By: Curtis Sites on 05/20/2015 08:23:17

## 2015-05-23 DIAGNOSIS — L97312 Non-pressure chronic ulcer of right ankle with fat layer exposed: Secondary | ICD-10-CM | POA: Diagnosis not present

## 2015-05-23 NOTE — Progress Notes (Signed)
SEANA, UNDERWOOD ([id number]) Visit Report for 05/23/2015 Arrival Information Details Patient Name: BARBERA, PERRITT. Date of Service: 05/23/2015 9:30 AM Medical Record Patient Account Number: [id number] Number: Afful, RN, [name], Treating RN: 12/07/1955 (58 y.o. [place] Sink Date of Birth/Sex: Female) Other Clinician: Primary Care Physician: Einar Crow Treating Evlyn Kanner Referring Physician: Einar Crow Physician/Extender: Tania Ade in Treatment: 0 Visit Information History Since Last Visit Any new allergies or adverse reactions: No Patient Arrived: Ambulatory Had a fall or experienced change in No Arrival Time: 09:33 activities of daily living that may affect Accompanied By: interpreter risk of falls: Transfer Assistance: None Signs or symptoms of abuse/neglect since last No Patient Identification Verified: Yes visito Secondary Verification Process Yes Has Dressing in Place as Prescribed: Yes Completed: Has Compression in Place as Prescribed: Yes Patient Has Alerts: Yes Pain Present Now: No Patient Alerts: DMII Electronic Signature(s) Signed: 05/23/2015 9:34:18 AM By: Elpidio Eric [name], RN Entered By: Elpidio Eric on 05/23/2015 09:34:18 Flinn, Clare TMarland Kitchen ([id number]) -------------------------------------------------------------------------------- Encounter Discharge Information Details Patient Name: [name] Date of Service: 05/23/2015 9:30 AM Medical Record Patient Account Number: [id number] Number: Afful, RN, [name], Treating RN: [birth year] (59 y.o. [place] Sink Date of Birth/Sex: Female) Other Clinician: Primary Care Physician: Einar Crow Treating Evlyn Kanner Referring Physician: Einar Crow Physician/Extender: Tania Ade in Treatment: 0 Encounter Discharge Information Items Discharge Pain Level: 0 Discharge Condition: Stable Ambulatory Status: Ambulatory Discharge Destination: Home Private Transportation: Auto Accompanied By:  interpreter Schedule Follow-up Appointment: No Medication Reconciliation completed and No provided to Patient/Care Rotha Cassels: Clinical Summary of Care: Electronic Signature(s) Signed: 05/23/2015 9:44:16 AM By: Elpidio Eric [name], RN Entered By: Elpidio Eric on 05/23/2015 09:44:16 Glassco, Grandville Silos ([id number]) -------------------------------------------------------------------------------- Patient/Caregiver Education Details Patient Name: [name] Date of Service: 05/23/2015 9:30 AM Medical Record Patient Account Number: [id number] Number: Afful, RN, [name], Treating RN: [birth year] (59 y.o. [place] Sink Date of Birth/Gender: Female) Other Clinician: Primary Care Physician: Einar Crow Treating Evlyn Kanner Referring Physician: Einar Crow Physician/Extender: Tania Ade in Treatment: 0 Education Assessment Education Provided To: Patient Education Topics Provided [facility]: Methods: Explain/Verbal Responses: State content correctly Wound/Skin Impairment: Methods: Explain/Verbal Responses: State content correctly Electronic Signature(s) Signed: 05/23/2015 9:43:51 AM By: Elpidio Eric [name], RN Entered By: Elpidio Eric on 05/23/2015 09:43:51

## 2015-05-27 ENCOUNTER — Encounter: Payer: Commercial Indemnity | Attending: Surgery | Admitting: Surgery

## 2015-05-27 DIAGNOSIS — L97312 Non-pressure chronic ulcer of right ankle with fat layer exposed: Secondary | ICD-10-CM | POA: Insufficient documentation

## 2015-05-27 DIAGNOSIS — E11622 Type 2 diabetes mellitus with other skin ulcer: Secondary | ICD-10-CM | POA: Diagnosis not present

## 2015-05-27 DIAGNOSIS — F329 Major depressive disorder, single episode, unspecified: Secondary | ICD-10-CM | POA: Diagnosis not present

## 2015-05-27 DIAGNOSIS — I83013 Varicose veins of right lower extremity with ulcer of ankle: Secondary | ICD-10-CM | POA: Insufficient documentation

## 2015-05-27 DIAGNOSIS — E1140 Type 2 diabetes mellitus with diabetic neuropathy, unspecified: Secondary | ICD-10-CM | POA: Diagnosis not present

## 2015-05-27 DIAGNOSIS — M1990 Unspecified osteoarthritis, unspecified site: Secondary | ICD-10-CM | POA: Insufficient documentation

## 2015-05-29 NOTE — Progress Notes (Signed)
SAVVY, PEETERS ([id number]) Visit Report for 05/27/2015 Chief Complaint Document Details Patient Name: Tammy Brewer, Tammy Brewer 05/27/2015 10:00 Date of Service: AM Medical Record [id number] Number: Patient Account Number: [id number] May 15, 1956 (59 y.o. Treating RN: Huel Coventry Date of Birth/Sex: Female) Other Clinician: Primary Care Physician: Einar Crow Treating Evlyn Kanner Referring Physician: Einar Crow Physician/Extender: Tania Ade in Treatment: 1 Information Obtained from: Patient Chief Complaint Patient presents for treatment of an open ulcer due to venous insufficiency. The patient who has diabetes mellitus has had an ulceration on the right ankle for about 3 months Electronic Signature(s) Signed: 05/27/2015 10:41:03 AM By: Evlyn Kanner MD, FACS Entered By: Evlyn Kanner on 05/27/2015 10:41:03 Dot Been ([id number]) -------------------------------------------------------------------------------- Debridement Details Patient Name: [name], [name] 05/27/2015 10:00 Date of Service: AM Medical Record [id number] Number: Patient Account Number: [id number] 1955-12-30 (59 y.o. Treating RN: Huel Coventry Date of Birth/Sex: Female) Other Clinician: Primary Care Physician: Einar Crow Treating Evlyn Kanner Referring Physician: Einar Crow Physician/Extender: Tania Ade in Treatment: 1 Debridement Performed for Wound #1 Right,Medial Malleolus Assessment: Performed By: Physician Tristan Schroeder., MD Debridement: Debridement Pre-procedure Yes Verification/Time Out Taken: Start Time: 10:29 Pain Control: Other : lidocaine 4% Level: Skin/Subcutaneous Tissue Total Area Debrided (L x 2.4 (cm) x 1.5 (cm) = 3.6 (cm) W): Tissue and other Viable, Non-Viable, Exudate, Fibrin/Slough, Subcutaneous material debrided: Instrument: Forceps Bleeding: Minimum Hemostasis Achieved: Pressure End Time: 10:30 Procedural Pain: 1 Post Procedural Pain: 1 Response to  Treatment: Procedure was tolerated well Post Debridement Measurements of Total Wound Length: (cm) 2.4 Width: (cm) 1.5 Depth: (cm) 0.1 Volume: (cm) 0.283 Post Procedure Diagnosis Same as Pre-procedure Electronic Signature(s) Signed: 05/27/2015 10:40:56 AM By: Evlyn Kanner MD, FACS Signed: 05/28/2015 5:33:23 PM By: Elliot Gurney RN, [name], [name], [name] Entered By: Evlyn Kanner on 05/27/2015 10:40:55 SYRIANA, CROSLIN ([id number]) -------------------------------------------------------------------------------- HPI Details Patient Name: Tammy Brewer 05/27/2015 10:00 Date of Service: AM Medical Record [id number] Number: Patient Account Number: [id number] September 24, 1955 (59 y.o. Treating RN: Huel Coventry Date of Birth/Sex: Female) Other Clinician: Primary Care Physician: Einar Crow Treating Evlyn Kanner Referring Physician: Einar Crow Physician/Extender: Tania Ade in Treatment: 1 History of Present Illness Location: ulcer on the right ankle Quality: Patient reports experiencing burning to affected area(s). Severity: Patient states wound are getting worse. Duration: Patient has had the wound for > 3 months prior to seeking treatment at the wound center Timing: Pain in wound is Intermittent (comes and goes Context: The wound appeared gradually over time Modifying Factors: Patient's employment requires standing for long periods of time Associated Signs and Symptoms: Patient reports presence of swelling HPI Description: 59 year old patient who comes from Dr. Alva Garnet office with the chief complaints of having a foot ulcer with redness for about 2 weeks. The patient has a past medical history of diabetes mellitus type 2 which is uncontrolled with neuropathy, hypercholesterolemia, osteoarthritis, major depression and has had a past history of appendectomy, cholecystectomy and a DandC. She has never been a smoker and her last hemoglobin A1c done in February 2016 was 8.2. As per  our office note she has recently seen podiatry and also has some vascular workup done recently and saw Dr. Gilda Crease on 04/18/2015. Clinically they found varicose veins of the lower extremity with both ulcers of the ankle and inflammation. There was an active ulcer on the right lower extremity. Lower extremity venous duplex exam was recommended and the patient was asked to wear compression stockings. Venous duplex studies are still pending. 05/27/2015 -- she has still not done her  venous duplex studies and understand she will be traveling out of town this week and does not want to wear a compression wrap and will get herself some compression stockings. Electronic Signature(s) Signed: 05/27/2015 10:41:56 AM By: Evlyn Kanner MD, FACS Entered By: Evlyn Kanner on 05/27/2015 10:41:55 ZIZA, HASTINGS ([id number]) -------------------------------------------------------------------------------- Physical Exam Details Patient Name: Tammy Brewer 05/27/2015 10:00 Date of Service: AM Medical Record [id number] Number: Patient Account Number: [id number] 06-05-1956 (59 y.o. Treating RN: Huel Coventry Date of Birth/Sex: Female) Other Clinician: Primary Care Physician: Einar Crow Treating Evlyn Kanner Referring Physician: Einar Crow Physician/Extender: Tania Ade in Treatment: 1 Constitutional . Pulse regular. Respirations normal and unlabored. Afebrile. . Eyes Nonicteric. Reactive to light. Ears, Nose, Mouth, and Throat Lips, teeth, and gums WNL.Marland Kitchen Moist mucosa without lesions . Neck supple and nontender. No palpable supraclavicular or cervical adenopathy. Normal sized without goiter. Respiratory WNL. No retractions.. Cardiovascular Pedal Pulses WNL. No clubbing, cyanosis or edema. Genitourinary (GU) No hydrocele, spermatocele, tenderness of the cord, or testicular mass.Marland Kitchen Penis without lesions.Renetta Chalk without lesions. No cystocele, or rectocele. Pelvic support intact, no  discharge. Marland Kitchen Urethra without masses, tenderness or scarring.Marland Kitchen Lymphatic No adneopathy. No adenopathy. No adenopathy. Musculoskeletal Adexa without tenderness or enlargement.. Digits and nails w/o clubbing, cyanosis, infection, petechiae, ischemia, or inflammatory conditions.. Integumentary (Hair, Skin) No suspicious lesions. No crepitus or fluctuance. No peri-wound warmth or erythema. No masses.Marland Kitchen Psychiatric Judgement and insight Intact.. No evidence of depression, anxiety, or agitation.. Notes the edema is well controlled and the medial ankle wounds are still open and covered with eschar and slough and was sharply debrided with saline gauze and forceps. Electronic Signature(s) Signed: 05/27/2015 10:42:30 AM By: Evlyn Kanner MD, FACS Entered By: Evlyn Kanner on 05/27/2015 10:42:29 BERIT, RACZKOWSKI ([id number], HUETHER ([id number]) -------------------------------------------------------------------------------- Physician Orders Details Patient Name: DAPHINE, LOCH 05/27/2015 10:00 Date of Service: AM Medical Record [id number] Number: Patient Account Number: [id number] 1956/01/23 (59 y.o. Treating RN: Huel Coventry Date of Birth/Sex: Female) Other Clinician: Primary Care Physician: Einar Crow Treating Evlyn Kanner Referring Physician: Einar Crow Physician/Extender: Tania Ade in Treatment: 1 Verbal / Phone Orders: Yes Clinician: Huel Coventry Read Back and Verified: Yes Diagnosis Coding Wound Cleansing Wound #1 Right,Medial Malleolus o Clean wound with Normal Saline. o Cleanse wound with mild soap and water Anesthetic Wound #1 Right,Medial Malleolus o Topical Lidocaine 4% cream applied to wound bed prior to debridement Primary Wound Dressing Wound #1 Right,Medial Malleolus o Aquacel Ag Secondary Dressing Wound #1 Right,Medial Malleolus o Boardered Foam Dressing Dressing Change Frequency Wound #1 Right,Medial Malleolus o Change dressing  every other day. Follow-up Appointments Wound #1 Right,Medial Malleolus o Return Appointment in 1 week. o Nurse Visit as needed Edema Control o Patient to wear own compression stockings o Support Garment 20-30 mm/Hg pressure to: - Patient to acquire compression garment Additional Orders / Instructions Wound #1 Right,Medial Malleolus o Increase protein intake. KHAYLA, KOPPENHAVER ([id number]) Electronic Signature(s) Signed: 05/27/2015 4:36:30 PM By: Evlyn Kanner MD, FACS Signed: 05/28/2015 5:33:23 PM By: Elliot Gurney RN, [name], [name], [name] Entered By: Elliot Gurney, RN, [name], Kim on 05/27/2015 10:34:20 TASHANNA, DOLIN ([id number]) -------------------------------------------------------------------------------- Problem List Details Patient Name: YANCY, HASCALL 05/27/2015 10:00 Date of Service: AM Medical Record [id number] Number: Patient Account Number: [id number] 1955/12/14 (59 y.o. 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 05/27/2015 10:00 Date of Service: AM Medical Record [id number] Number: Patient Account Number: [id number] [birth year] (59 y.o. Treating RN: Huel Coventry Date of  Birth/Sex: Female) Other Clinician: Primary Care Physician: Einar Crow Treating Evlyn Kanner Referring Physician: Einar Crow Physician/Extender: Tania Ade in Treatment: 1 Subjective Chief Complaint Information obtained from Patient Patient presents for treatment of an open ulcer due to venous insufficiency. The patient who has diabetes mellitus has had an ulceration on the right ankle for about 3 months History of Present Illness (HPI) The following HPI elements were documented for the patient's wound: Location: ulcer on the right ankle Quality: Patient reports experiencing burning to affected area(s). Severity: Patient states wound are getting worse. Duration: Patient has had the wound for > 3 months prior to seeking treatment at the wound center Timing: Pain in wound is Intermittent (comes and goes Context: The wound appeared gradually over time Modifying Factors: Patient's employment requires standing for long periods of time Associated Signs and Symptoms: Patient reports presence of swelling 59 year old patient who comes from Dr. Gaynell Face Anderson's office with the chief complaints of having a foot ulcer with redness for about 2 weeks. The patient has a past medical history of diabetes mellitus type 2 which is uncontrolled with neuropathy, hypercholesterolemia, osteoarthritis, major depression and has had a past history of appendectomy, cholecystectomy and a DandC. She has never been a smoker and her last hemoglobin A1c done in February 2016 was 8.2. As per our office note she has recently seen podiatry and also has some vascular workup done recently and saw Dr. Gilda Crease on 04/18/2015. Clinically they found varicose veins of the lower extremity with both ulcers of the ankle and inflammation. There was an active ulcer on the right lower extremity. Lower extremity venous duplex exam was recommended and the patient was asked to wear compression stockings. Venous duplex  studies are still pending. 05/27/2015 -- she has still not done her venous duplex studies and understand she will be traveling out of town this week and does not want to wear a compression wrap and will get herself some compression stockings. Objective Borman, Shizue T. ([id number]) Constitutional Pulse regular. Respirations normal and unlabored. Afebrile. Vitals Time Taken: 10:05 AM, Height: 61 in, Weight: 160 lbs, BMI: 30.2, Temperature: 98.0 F, Pulse: 70 bpm, Respiratory Rate: 20 breaths/min, Blood Pressure: 111/70 mmHg. Eyes Nonicteric. Reactive to light. Ears, Nose, Mouth, and Throat Lips, teeth, and gums WNL.Marland Kitchen Moist mucosa without lesions . Neck supple and nontender. No palpable supraclavicular or cervical adenopathy. Normal sized without goiter. Respiratory WNL. No retractions.. Cardiovascular Pedal Pulses WNL. No clubbing, cyanosis or edema. Genitourinary (GU) No hydrocele, spermatocele, tenderness of the cord, or testicular mass.Marland Kitchen Penis without lesions.Renetta Chalk without lesions. No cystocele, or rectocele. Pelvic support intact, no discharge. Marland Kitchen Urethra without masses, tenderness or scarring.Marland Kitchen Lymphatic No adneopathy. No adenopathy. No adenopathy. Musculoskeletal Adexa without tenderness or enlargement.. Digits and nails w/o clubbing, cyanosis, infection, petechiae, ischemia, or inflammatory conditions.Marland Kitchen Psychiatric Judgement and insight Intact.. No evidence of depression, anxiety, or agitation.. General Notes: the edema is well controlled and the medial ankle wounds are still open  and covered with eschar and slough and was sharply debrided with saline gauze and forceps. Integumentary (Hair, Skin) No suspicious lesions. No crepitus or fluctuance. No peri-wound warmth or erythema. No masses.. Wound #1 status is Open. Original cause of wound was Pimple. The wound is located on the Right,Medial Malleolus. The wound measures 2.4cm length x 1.5cm width x 0.1cm depth;  2.827cm^2 area and 0.283cm^3 volume. The wound is limited to skin breakdown. There is no tunneling or undermining noted. There is a small amount of serous drainage noted. The wound margin is flat and intact. There is no granulation within the wound bed. There is a large (67-100%) amount of necrotic tissue within the wound bed including Eschar and Adherent Slough. The periwound skin appearance exhibited: Dry/Scaly, Moist. Hieronymus, Kelcee T. ([id number]) The periwound skin appearance did not exhibit: Callus, Crepitus, Excoriation, Fluctuance, Friable, Induration, Localized Edema, Rash, Scarring, Maceration, Atrophie Blanche, Cyanosis, Ecchymosis, Hemosiderin Staining, Mottled, Pallor, Rubor, Erythema. Periwound temperature was noted as No Abnormality. 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 I have discussed the risks and benefits of this via the interpreter and she fully understands that her decision will be honored, but she may prolong her treatment because of noncompliance. She will come back to visit as after her travel Procedures Wound #1 Wound #1 is a Diabetic Wound/Ulcer of the Lower Extremity located on the Right,Medial Malleolus . There was a Skin/Subcutaneous Tissue Debridement ([id number]) debridement with total area of 3.6 sq cm performed by Nasiya Pascual, Ignacia Felling., MD. with the following instrument(s): Forceps to remove Viable and Non- Viable tissue/material including Exudate, Fibrin/Slough, and Subcutaneous after achieving pain control using Other (lidocaine 4%). A time out was conducted prior to the start of the procedure. A  Minimum amount of bleeding was controlled with Pressure. The procedure was tolerated well with a pain level of 1 throughout and a pain level of 1 following the procedure. Post Debridement Measurements: 2.4cm length x 1.5cm width x 0.1cm depth; 0.283cm^3 volume. Post procedure Diagnosis Wound #1: Same as Pre-Procedure Plan AARTI, MANKOWSKI T. ([id number]) Wound Cleansing: Wound #1 Right,Medial Malleolus: Clean wound with Normal Saline. Cleanse wound with mild soap and water Anesthetic: Wound #1 Right,Medial Malleolus: Topical Lidocaine 4% cream applied to wound bed prior to debridement Primary Wound Dressing: Wound #1 Right,Medial Malleolus: Aquacel Ag Secondary Dressing: Wound #1 Right,Medial Malleolus: Boardered Foam Dressing Dressing Change Frequency: Wound #1 Right,Medial Malleolus: Change dressing every other day. Follow-up Appointments: Wound #1 Right,Medial Malleolus: Return Appointment in 1 week. Nurse Visit as needed Edema Control: Patient to wear own compression stockings Support Garment 20-30 mm/Hg pressure to: - Patient to acquire compression garment Additional Orders / Instructions: Wound #1 Right,Medial Malleolus: Increase protein intake. The patient has not been compliant about getting a venous duplex studies done and now she does not want to wear the compression wrap thi week because of her travel. I have discussed the risks and benefits of this via the interpreter and she fully understands that her decision will be honored, but she may prolong her treatment because of noncompliance. She will come back to visit as after her travel Electronic Signature(s) Signed: 05/27/2015 10:43:59 AM  By: Evlyn Kanner MD, FACS Entered By: Evlyn Kanner on 05/27/2015 10:43:59 Reeg, Dexter TMarland Kitchen ([id number]) -------------------------------------------------------------------------------- SuperBill Details Patient Name: [name] Date of Service: 05/27/2015 Medical Record  Number: [id number] Patient Account Number: [id number] Date of Birth/Sex: Aug 07, 1956 (59 y.o. 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 Facility Procedures CPT4 Code Description: [id number] 11042 - DEB SUBQ TISSUE 20 SQ CM/< ICD-10 Description Diagnosis E11.622 Type 2 diabetes mellitus with other skin ulcer I83.013 Varicose veins of right lower extremity with ulcer L97.312 Non-pressure chronic ulcer of right  ankle with fat Modifier: of ankle layer expose Quantity: 1 d Physician Procedures CPT4 Code Description: [id number] 11042 - WC PHYS SUBQ TISS 20 SQ CM ICD-10 Description Diagnosis E11.622 Type 2 diabetes mellitus with other skin ulcer I83.013 Varicose veins of right lower extremity with ulcer L97.312 Non-pressure chronic ulcer of right  ankle with fat Modifier: of ankle layer expose Quantity: 1 d Electronic Signature(s) Signed: 05/27/2015 10:44:12 AM By: Evlyn Kanner MD, FACS Entered By: Evlyn Kanner on 05/27/2015 10:44:12

## 2015-05-29 NOTE — Progress Notes (Signed)
Tammy Brewer, Tammy Brewer ([id number]) Visit Report for 05/27/2015 Arrival Information Details Patient Name: Tammy Brewer, [name] Date of Service: 05/27/2015 10:00 AM Medical Record Number: [id number] Patient Account Number: [id number] Date of Birth/Sex: [birth year] (59 y.o. Female) Treating RN: Renee Harder Primary Care Physician: Einar Crow Other Clinician: Referring Physician: Einar Crow Treating Physician/Extender: Rudene Re in Treatment: 1 Visit Information History Since Last Visit Added or deleted any medications: No Patient Arrived: Ambulatory Any new allergies or adverse reactions: No Arrival Time: 10:04 Had a fall or experienced change in No Accompanied By: interpreter activities of daily living that may affect Transfer Assistance: None risk of falls: Patient Has Alerts: Yes Signs or symptoms of abuse/neglect since last No Patient Alerts: DMII visito Hospitalized since last visit: No Has Dressing in Place as Prescribed: Yes Has Compression in Place as Prescribed: Yes Pain Present Now: No Electronic Signature(s) Signed: 05/27/2015 4:32:26 PM By: Renee Harder RN Entered By: Renee Harder on 05/27/2015 10:05:58 Tammy Brewer, Tammy T. ([id number]) -------------------------------------------------------------------------------- Encounter Discharge Information Details Patient Name: Tammy Broom T. Date of Service: 05/27/2015 10:00 AM Medical Record Number: [id number] Patient Account Number: [id number] Date of Birth/Sex: 1955-09-04 (59 y.o. Female) Treating RN: Huel Coventry Primary Care Physician: Einar Crow Other Clinician: Referring Physician: Einar Crow Treating Physician/Extender: Rudene Re in Treatment: 1 Encounter Discharge Information Items Discharge Condition: Stable Ambulatory Status: Ambulatory Discharge Destination: Home Transportation: Private Auto Accompanied By: interpreter Schedule Follow-up Appointment: Yes Medication  Reconciliation completed and provided to Patient/Care No Dmarius Reeder: Provided on Clinical Summary of Care: 05/27/2015 Form Type Recipient Paper Patient MG Electronic Signature(s) Signed: 05/27/2015 4:32:26 PM By: Renee Harder RN Previous Signature: 05/27/2015 10:42:26 AM Version By: Gwenlyn Perking Entered By: Renee Harder on 05/27/2015 10:48:24 Tammy Brewer, Tammy T. ([id number]) -------------------------------------------------------------------------------- Lower Extremity Assessment Details Patient Name: [name], [name] Date of Service: 05/27/2015 10:00 AM Medical Record Number: [id number] Patient Account Number: [id number] Date of Birth/Sex: 1956-03-30 (59 y.o. Female) Treating RN: Renee Harder Primary Care Physician: Einar Crow Other Clinician: Referring Physician: Einar Crow Treating Physician/Extender: Rudene Re in Treatment: 1 Edema Assessment Assessed: [Left: Yes] [Right: Yes] Edema: [Left: Yes] [Right: Yes] Calf Left: Right: Point of Measurement: 30 cm From Medial Instep 36.4 cm 35.6 cm Ankle Left: Right: Point of Measurement: 10 cm From Medial Instep 20.6 cm 19.4 cm Vascular Assessment Claudication: Claudication Assessment [Left:None] [Right:None] Pulses: Posterior Tibial Palpable: [Left:Yes] [Right:Yes] Dorsalis Pedis Palpable: [Left:Yes] [Right:Yes] Extremity colors, hair growth, and conditions: Extremity Color: [Left:Hyperpigmented] [Right:Normal] Hair Growth on Extremity: [Left:Yes] [Right:Yes] Temperature of Extremity: [Left:Warm] [Right:Warm] Capillary Refill: [Left:< 3 seconds] [Right:< 3 seconds] Dependent Rubor: [Left:No] [Right:No] Blanched when Elevated: [Left:No] [Right:No] Lipodermatosclerosis: [Left:No] [Right:No] Toe Nail Assessment Left: Right: Thick: Yes Yes Discolored: No No Deformed: No No Improper Length and Hygiene: No No Tammy Brewer ([id number]) Electronic Signature(s) Signed: 05/27/2015 4:32:26 PM By: Renee Harder RN Entered By: Renee Harder on 05/27/2015 10:16:16 Tammy Brewer, Tammy T. ([id number]) -------------------------------------------------------------------------------- Multi Wound Chart Details Patient Name: Tammy Broom T. Date of Service: 05/27/2015 10:00 AM Medical Record Number: [id number] Patient Account Number: [id number] Date of Birth/Sex: 07/22/1956 (59 y.o. Female) Treating RN: Huel Coventry Primary Care Physician: Einar Crow Other Clinician: Referring Physician: Einar Crow Treating Physician/Extender: Rudene Re in Treatment: 1 Vital Signs Height(in): 61 Pulse(bpm): 70 Weight(lbs): 160 Blood Pressure 111/70 (mmHg): Body Mass Index(BMI): 30 Temperature(F): 98.0 Respiratory Rate 20 (breaths/min): Photos: [1:No Photos] [N/A:N/A] Wound Location: [1:Right Malleolus - Medial N/A] Wounding Event: [1:Pimple] [N/A:N/A] Primary Etiology: [1:Diabetic Wound/Ulcer of N/A the Lower Extremity]  Secondary Etiology: [1:Venous Leg Ulcer] [N/A:N/A] Comorbid History: [1:Type II Diabetes, Osteoarthritis, Neuropathy] [N/A:N/A] Date Acquired: [1:02/18/2015] [N/A:N/A] Weeks of Treatment: [1:1] [N/A:N/A] Wound Status: [1:Open] [N/A:N/A] Measurements L x W x D 2.4x1.5x0.1 [N/A:N/A] (cm) Area (cm) : [1:2.827] [N/A:N/A] Volume (cm) : [1:0.283] [N/A:N/A] % Reduction in Area: [1:-20.00%] [N/A:N/A] % Reduction in Volume: 39.90% [N/A:N/A] Classification: [1:Grade 1] [N/A:N/A] Exudate Amount: [1:Small] [N/A:N/A] Exudate Type: [1:Serous] [N/A:N/A] Exudate Color: [1:amber] [N/A:N/A] Wound Margin: [1:Flat and Intact] [N/A:N/A] Granulation Amount: [1:None Present (0%)] [N/A:N/A] Necrotic Amount: [1:Large (67-100%)] [N/A:N/A] Necrotic Tissue: [1:Eschar, Adherent Slough N/A] Exposed Structures: [1:Fascia: No Fat: No Tendon: No Muscle: No Joint: No] [N/A:N/A] Bone: No Limited to Skin Breakdown Epithelialization: None N/A N/A Periwound Skin Texture: Edema: No N/A  N/A Excoriation: No Induration: No Callus: No Crepitus: No Fluctuance: No Friable: No Rash: No Scarring: No Periwound Skin Moist: Yes N/A N/A Moisture: Dry/Scaly: Yes Maceration: No Periwound Skin Color: Atrophie Blanche: No N/A N/A Cyanosis: No Ecchymosis: No Erythema: No Hemosiderin Staining: No Mottled: No Pallor: No Rubor: No Temperature: No Abnormality N/A N/A Tenderness on No N/A N/A Palpation: Wound Preparation: Ulcer Cleansing: N/A N/A Rinsed/Irrigated with Saline Topical Anesthetic Applied: Other: lidocaine 4% Treatment Notes Electronic Signature(s) Signed: 05/28/2015 5:33:23 PM By: Elliot Gurney, RN, [name], [name], [name] Entered By: Elliot Gurney, RN, [name], Kim on 05/27/2015 10:26:21 Tammy Brewer ([id number]) -------------------------------------------------------------------------------- Multi-Disciplinary Care Plan Details Patient Name: Tammy Brewer, Tammy T. Date of Service: 05/27/2015 10:00 AM Medical Record Number: [id number] Patient Account Number: [id number] Date of Birth/Sex: [birth year] (59 y.o. Female) Treating RN: Huel Coventry Primary Care Physician: Einar Crow Other Clinician: Referring Physician: Einar Crow Treating Physician/Extender: Rudene Re in Treatment: 1 Active Inactive Orientation to the Wound Care Program Nursing Diagnoses: Knowledge deficit related to the wound healing center program Goals: Patient/caregiver will verbalize understanding of the Wound Healing Center Program Date Initiated: 05/20/2015 Goal Status: Active Interventions: Provide education on orientation to the wound center Notes: Wound/Skin Impairment Nursing Diagnoses: Impaired tissue integrity Goals: Patient/caregiver will verbalize understanding of skin care regimen Date Initiated: 05/20/2015 Goal Status: Active Ulcer/skin breakdown will heal within 14 weeks Date Initiated: 05/20/2015 Goal Status: Active Interventions: Assess patient/caregiver ability to  perform ulcer/skin care regimen upon admission and as needed Provide education on ulcer and skin care Treatment Activities: Topical wound management initiated : 05/27/2015 Notes: ENISA, RUNYAN ([id number]) Electronic Signature(s) Signed: 05/28/2015 5:33:23 PM By: Elliot Gurney, RN, [name], [name], [name] Entered By: Elliot Gurney, RN, [name], Kim on 05/27/2015 10:26:12 Tammy Brewer, Tammy Brewer ([id number]) -------------------------------------------------------------------------------- Pain Assessment Details Patient Name: Tammy Broom T. Date of Service: 05/27/2015 10:00 AM Medical Record Number: [id number] Patient Account Number: [id number] Date of Birth/Sex: 05-02-1956 (59 y.o. Female) Treating RN: Renee Harder Primary Care Physician: Einar Crow Other Clinician: Referring Physician: Einar Crow Treating Physician/Extender: Rudene Re in Treatment: 1 Active Problems Location of Pain Severity and Description of Pain Patient Has Paino No Site Locations Pain Management and Medication Current Pain Management: Electronic Signature(s) Signed: 05/27/2015 4:32:26 PM By: Renee Harder RN Entered By: Renee Harder on 05/27/2015 10:06:06 Tammy Brewer, Tammy Brewer ([id number]) -------------------------------------------------------------------------------- Patient/Caregiver Education Details Patient Name: Tammy Broom T. Date of Service: 05/27/2015 10:00 AM Medical Record Number: [id number] Patient Account Number: [id number] Date of Birth/Gender: Jan 07, 1956 (59 y.o. Female) Treating RN: Renee Harder Primary Care Physician: Einar Crow Other Clinician: Referring Physician: Einar Crow Treating Physician/Extender: Rudene Re in Treatment: 1 Education Assessment Education Provided To: Patient Education Topics Provided Basic Hygiene: Methods: Explain/Verbal Responses: State content correctly Elevated Blood Sugar/ Impact on  Healing: Methods: Explain/Verbal Responses: State  content correctly Venous: Methods: Explain/Verbal Responses: State content correctly Wound Debridement: Methods: Explain/Verbal Responses: State content correctly Wound/Skin Impairment: Methods: Explain/Verbal Responses: State content correctly Electronic Signature(s) Signed: 05/27/2015 4:32:26 PM By: Renee Harder RN Entered By: Renee Harder on 05/27/2015 10:48:01 Tammy Brewer, Tammy T. ([id number]) -------------------------------------------------------------------------------- Wound Assessment Details Patient Name: Tammy Brewer, Tammy T. Date of Service: 05/27/2015 10:00 AM Medical Record Number: [id number] Patient Account Number: [id number] Date of Birth/Sex: 08-03-1956 (59 y.o. Female) Treating RN: Renee Harder Primary Care Physician: Einar Crow Other Clinician: Referring Physician: Einar Crow Treating Physician/Extender: Rudene Re in Treatment: 1 Wound Status Wound Number: 1 Primary Etiology: Diabetic Wound/Ulcer of the Lower Extremity Wound Location: Right Malleolus - Medial Secondary Venous Leg Ulcer Wounding Event: Pimple Etiology: Date Acquired: 02/18/2015 Wound Status: Open Weeks Of Treatment: 1 Comorbid Type II Diabetes, Osteoarthritis, Clustered Wound: No History: Neuropathy Photos Photo Uploaded By: Renee Harder on 05/27/2015 16:41:23 Wound Measurements Length: (cm) 2.4 Width: (cm) 1.5 Depth: (cm) 0.1 Area: (cm) 2.827 Volume: (cm) 0.283 % Reduction in Area: -20% % Reduction in Volume: 39.9% Epithelialization: None Tunneling: No Undermining: No Wound Description Classification: Grade 1 Wound Margin: Flat and Intact Exudate Amount: Small Exudate Type: Serous Tammy Brewer, Meztli T. ([id number]) Foul Odor After Cleansing: No Exudate Color: amber Wound Bed Granulation Amount: None Present (0%) Exposed Structure Necrotic Amount: Large (67-100%) Fascia Exposed: No Necrotic Quality: Eschar, Adherent Slough Fat Layer Exposed: No Tendon  Exposed: No Muscle Exposed: No Joint Exposed: No Bone Exposed: No Limited to Skin Breakdown Periwound Skin Texture Texture Color No Abnormalities Noted: No No Abnormalities Noted: No Callus: No Atrophie Blanche: No Crepitus: No Cyanosis: No Excoriation: No Ecchymosis: No Fluctuance: No Erythema: No Friable: No Hemosiderin Staining: No Induration: No Mottled: No Localized Edema: No Pallor: No Rash: No Rubor: No Scarring: No Temperature / Pain Moisture Temperature: No Abnormality No Abnormalities Noted: No Dry / Scaly: Yes Maceration: No Moist: Yes Wound Preparation Ulcer Cleansing: Rinsed/Irrigated with Saline Topical Anesthetic Applied: Other: lidocaine 4%, Treatment Notes Wound #1 (Right, Medial Malleolus) 1. Cleansed with: Clean wound with Normal Saline May Shower, gently pat wound dry prior to applying new dressing. 2. Anesthetic Topical Lidocaine 4% cream to wound bed prior to debridement 4. Dressing Applied: Aquacel Ag 5. Secondary Dressing Applied Bordered Foam Dressing 7. Secured with LALANA, WACHTER TMarland Kitchen ([id number]) Other (specify in notes) Notes Pt is to purchase and apply compression stockings Electronic Signature(s) Signed: 05/27/2015 4:32:26 PM By: Renee Harder RN Entered By: Renee Harder on 05/27/2015 10:21:17 Grabski, Sae T. ([id number]) -------------------------------------------------------------------------------- Vitals Details Patient Name: Tammy Broom T. Date of Service: 05/27/2015 10:00 AM Medical Record Number: [id number] Patient Account Number: [id number] Date of Birth/Sex: 02/01/1956 (59 y.o. Female) Treating RN: Renee Harder Primary Care Physician: Einar Crow Other Clinician: Referring Physician: Einar Crow Treating Physician/Extender: Rudene Re in Treatment: 1 Vital Signs Time Taken: 10:05 Temperature (F): 98.0 Height (in): 61 Pulse (bpm): 70 Weight (lbs): 160 Respiratory Rate (breaths/min):  20 Body Mass Index (BMI): 30.2 Blood Pressure (mmHg): 111/70 Reference Range: 80 - 120 mg / dl Electronic Signature(s) Signed: 05/27/2015 4:32:26 PM By: Renee Harder RN Entered By: Renee Harder on 05/27/2015 10:12:25

## 2015-06-03 ENCOUNTER — Ambulatory Visit: Payer: Self-pay | Admitting: General Surgery

## 2015-06-10 ENCOUNTER — Encounter: Payer: Commercial Indemnity | Admitting: Surgery

## 2015-06-10 DIAGNOSIS — L97312 Non-pressure chronic ulcer of right ankle with fat layer exposed: Secondary | ICD-10-CM | POA: Diagnosis not present

## 2015-06-11 NOTE — Progress Notes (Signed)
KAILANI, BRASS ([id number]) Visit Report for 06/10/2015 Chief Complaint Document Details Patient Name: Tammy Brewer, Tammy Brewer 06/10/2015 9:30 Date of Service: AM Medical Record [id number] Number: Patient Account Number: [id number] 07/21/1956 (59 y.o. Treating RN: Curtis Sites Date of Birth/Sex: Female) Other Clinician: Primary Care Physician: Einar Crow Treating Evlyn Kanner Referring Physician: Einar Crow Physician/Extender: Tania Ade in Treatment: 3 Information Obtained from: Patient Chief Complaint Patient presents for treatment of an open ulcer due to venous insufficiency. The patient who has diabetes mellitus has had an ulceration on the right ankle for about 3 months Electronic Signature(s) Signed: 06/10/2015 10:01:22 AM By: Evlyn Kanner MD, FACS Entered By: Evlyn Kanner on 06/10/2015 10:01:22 FELIPE, CABELL ([id number]) -------------------------------------------------------------------------------- HPI Details Patient Name: Tammy, Brewer 06/10/2015 9:30 Date of Service: AM Medical Record [id number] Number: Patient Account Number: [id number] 1956/01/30 (59 y.o. Treating RN: Curtis Sites Date of Birth/Sex: Female) Other Clinician: Primary Care Physician: Einar Crow Treating Evlyn Kanner Referring Physician: Einar Crow Physician/Extender: Tania Ade in Treatment: 3 History of Present Illness Location: ulcer on the right ankle Quality: Patient reports experiencing burning to affected area(s). Severity: Patient states wound are getting worse. Duration: Patient has had the wound for > 3 months prior to seeking treatment at the wound center Timing: Pain in wound is Intermittent (comes and goes Context: The wound appeared gradually over time Modifying Factors: Patient's employment requires standing for long periods of time Associated Signs and Symptoms: Patient reports presence of swelling HPI Description: 59 year old patient who comes from  Dr. Alva Garnet office with the chief complaints of having a foot ulcer with redness for about 2 weeks. The patient has a past medical history of diabetes mellitus type 2 which is uncontrolled with neuropathy, hypercholesterolemia, osteoarthritis, major depression and has had a past history of appendectomy, cholecystectomy and a DandC. She has never been a smoker and her last hemoglobin A1c done in February 2016 was 8.2. As per our office note she has recently seen podiatry and also has some vascular workup done recently and saw Dr. Gilda Crease on 04/18/2015. Clinically they found varicose veins of the lower extremity with both ulcers of the ankle and inflammation. There was an active ulcer on the right lower extremity. Lower extremity venous duplex exam was recommended and the patient was asked to wear compression stockings. Venous duplex studies are still pending. 05/27/2015 -- she has still not done her venous duplex studies and understand she will be traveling out of town this week and does not want to wear a compression wrap and will get herself some compression stockings. 06/10/2015 -- she is back from her travels and was wearing a compression stocking as per her history. She has not yet got her vascular workup and intervention done. Electronic Signature(s) Signed: 06/10/2015 10:01:48 AM By: Evlyn Kanner MD, FACS Entered By: Evlyn Kanner on 06/10/2015 10:01:48 MORAIMA, BURD ([id number]) -------------------------------------------------------------------------------- Physical Exam Details Patient Name: Tammy, Brewer 06/10/2015 9:30 Date of Service: AM Medical Record [id number] Number: Patient Account Number: [id number] 04/05/1956 (59 y.o. Treating RN: Curtis Sites Date of Birth/Sex: Female) Other Clinician: Primary Care Physician: Einar Crow Treating Evlyn Kanner Referring Physician: Einar Crow Physician/Extender: Tania Ade in Treatment:  3 Constitutional . Pulse regular. Respirations normal and unlabored. Afebrile. . Eyes Nonicteric. Reactive to light. Ears, Nose, Mouth, and Throat Lips, teeth, and gums WNL.Marland Kitchen Moist mucosa without lesions . Neck supple and nontender. No palpable supraclavicular or cervical adenopathy. Normal sized without goiter. Respiratory WNL. No retractions.. Cardiovascular Pedal Pulses WNL. No clubbing, cyanosis or edema.  Lymphatic No adneopathy. No adenopathy. No adenopathy. Musculoskeletal Adexa without tenderness or enlargement.. Digits and nails w/o clubbing, cyanosis, infection, petechiae, ischemia, or inflammatory conditions.. Integumentary (Hair, Skin) No suspicious lesions. No crepitus or fluctuance. No peri-wound warmth or erythema. No masses.Marland Kitchen Psychiatric Judgement and insight Intact.. No evidence of depression, anxiety, or agitation.. Notes the patient's ulceration is smaller but has some debris which was washed out with saline and gauze. Her edema is better but still persistent. Electronic Signature(s) Signed: 06/10/2015 10:02:24 AM By: Evlyn Kanner MD, FACS Entered By: Evlyn Kanner on 06/10/2015 10:02:24 ZAELYN, NOACK ([id number]) -------------------------------------------------------------------------------- Physician Orders Details Patient Name: Tammy, Brewer 06/10/2015 9:30 Date of Service: AM Medical Record [id number] Number: Patient Account Number: [id number] 13-Nov-1955 (59 y.o. Treating RN: Curtis Sites Date of Birth/Sex: Female) Other Clinician: Primary Care Physician: Einar Crow Treating Evlyn Kanner Referring Physician: Einar Crow Physician/Extender: Tania Ade in Treatment: 3 Verbal / Phone Orders: Yes Clinician: Curtis Sites Read Back and Verified: Yes Diagnosis Coding Wound Cleansing Wound #1 Right,Medial Malleolus o Clean wound with Normal Saline. o Cleanse wound with mild soap and water Anesthetic Wound #1 Right,Medial  Malleolus o Topical Lidocaine 4% cream applied to wound bed prior to debridement Primary Wound Dressing Wound #1 Right,Medial Malleolus o Aquacel Ag Secondary Dressing Wound #1 Right,Medial Malleolus o ABD pad Dressing Change Frequency Wound #1 Right,Medial Malleolus o Change dressing every week Follow-up Appointments Wound #1 Right,Medial Malleolus o Return Appointment in 1 week. o Nurse Visit as needed Edema Control o Unna Boot to Right Lower Extremity Additional Orders / Instructions Wound #1 Right,Medial Malleolus o Increase protein intake. JOURY, ALLCORN ([id number]) Electronic Signature(s) Signed: 06/10/2015 4:43:13 PM By: Evlyn Kanner MD, FACS Signed: 06/10/2015 5:48:25 PM By: Curtis Sites Entered By: Curtis Sites on 06/10/2015 09:59:45 HAYDEE, JABBOUR ([id number]) -------------------------------------------------------------------------------- Problem List Details Patient Name: KAILEENA, OBI 06/10/2015 9:30 Date of Service: AM Medical Record [id number] Number: Patient Account Number: [id number] 13-Jan-1956 (59 y.o. 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 06/10/2015 9:30 Date of Service: AM Medical Record [id number] Number: Patient Account Number: [id number] [birth year] (59 y.o. Treating RN: Curtis Sites Date of Birth/Sex: Female) Other Clinician: Primary Care Physician: Einar Crow Treating Evlyn Kanner Referring Physician: Einar Crow Physician/Extender: Tania Ade in Treatment: 3 Subjective Chief Complaint Information obtained from Patient Patient presents for treatment of an open ulcer due to venous insufficiency. The patient who has diabetes mellitus has had an ulceration on the right ankle for about 3 months History of Present Illness (HPI) The following HPI elements were documented for the patient's wound: Location: ulcer on the right ankle Quality: Patient reports experiencing burning to affected area(s). Severity: Patient states wound are getting worse. Duration: Patient has had the wound for > 3 months prior to seeking treatment at the wound center Timing: Pain in wound is Intermittent (comes and goes Context: The wound appeared gradually over time Modifying Factors: Patient's employment requires  standing for long periods of time Associated Signs and Symptoms: Patient reports presence of swelling 59 year old patient who comes from Dr. Gaynell FaceMarshall Anderson's office with the chief complaints of having a foot ulcer with redness for about 2 weeks. The patient has a past medical history of diabetes mellitus type 2 which is uncontrolled with neuropathy, hypercholesterolemia, osteoarthritis, major depression and has had a past history of appendectomy, cholecystectomy and a DandC. She has never been a smoker and her last hemoglobin A1c done in February 2016 was 8.2. As per our office note she has recently seen podiatry and also has some vascular workup done recently and saw Dr. Gilda CreaseSchnier on 04/18/2015. Clinically they found varicose veins of the lower extremity with both ulcers of the  ankle and inflammation. There was an active ulcer on the right lower extremity. Lower extremity venous duplex exam was recommended and the patient was asked to wear compression stockings. Venous duplex studies are still pending. 05/27/2015 -- she has still not done her venous duplex studies and understand she will be traveling out of town this week and does not want to wear a compression wrap and will get herself some compression stockings. 06/10/2015 -- she is back from her travels and was wearing a compression stocking as per her history. She has not yet got her vascular workup and intervention done. [name], Kyley T. ([id number]) Objective Constitutional Pulse regular. Respirations normal and unlabored. Afebrile. Vitals Time Taken: 9:42 AM, Height: 61 in, Weight: 160 lbs, BMI: 30.2, Pulse: 80 bpm, Respiratory Rate: 18 breaths/min, Blood Pressure: 107/58 mmHg. Eyes Nonicteric. Reactive to light. Ears, Nose, Mouth, and Throat Lips, teeth, and gums WNL.Marland Kitchen. Moist mucosa without lesions . Neck supple and nontender. No palpable supraclavicular or cervical adenopathy. Normal sized without goiter. Respiratory WNL. No retractions.. Cardiovascular Pedal Pulses WNL. No clubbing, cyanosis or edema. Lymphatic No adneopathy. No adenopathy. No adenopathy. Musculoskeletal Adexa without tenderness or enlargement.. Digits and nails w/o clubbing, cyanosis, infection, petechiae, ischemia, or inflammatory conditions.Marland Kitchen. Psychiatric Judgement and insight Intact.. No evidence of depression, anxiety, or agitation.. General Notes: the patient's ulceration is smaller but has some debris which was washed out with saline and gauze. Her edema is better but still persistent. Integumentary (Hair, Skin) No suspicious lesions. No crepitus or fluctuance. No peri-wound warmth or erythema. No masses.. Wound #1 status is Open. Original cause of wound was Pimple. The wound is located on the Right,Medial Malleolus. The  wound measures 0.3cm length x 0.4cm width x 0.1cm depth; 0.094cm^2 area and 0.009cm^3 volume. The wound is limited to skin breakdown. There is no tunneling or undermining noted. There is a small amount of serous drainage noted. The wound margin is flat and intact. There is large (67- 100%) pink granulation within the wound bed. There is no necrotic tissue within the wound bed. The periwound skin appearance exhibited: Dry/Scaly, Moist. The periwound skin appearance did not exhibit: Callus, Crepitus, Excoriation, Fluctuance, Friable, Induration, Localized Edema, Rash, Scarring, Maceration, Atrophie Blanche, Cyanosis, Ecchymosis, Hemosiderin Staining, Mottled, Pallor, Rubor, Boulais, Creta T. ([id number]) Erythema. Periwound temperature was noted as No Abnormality. 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 she should wear a dressing with silver alginate and Unna's boots. 2. She should get in touch with the vascular office and get  her venous ultrasound and opinion for intervention as soon as possible 3. She should continue her daily activities but when she gets a chance she should elevate her legs as much as possible. 4. After discussing this she says she is going to be compliant. Plan Wound Cleansing: Wound #1 Right,Medial Malleolus: Clean wound with Normal Saline. Cleanse wound with mild soap and water Anesthetic: Wound #1 Right,Medial Malleolus: Topical Lidocaine 4% cream applied to wound bed prior to debridement Primary Wound Dressing: Wound #1 Right,Medial Malleolus: Aquacel Ag Secondary Dressing: Wound #1 Right,Medial Malleolus: ABD pad Dressing Change Frequency: Wound #1 Right,Medial Malleolus: Change dressing every  week Follow-up Appointments: Wound #1 Right,Medial Malleolus: Return Appointment in 1 week. Nurse Visit as needed TENELLE, ANDREASON ([id number]) Edema Control: Henriette Combs to Right Lower Extremity Additional Orders / Instructions: Wound #1 Right,Medial Malleolus: Increase protein intake. With the help of an interpreter I have advised her the following: 1. she should wear a dressing with silver alginate and Unna's boots. 2. She should get in touch with the vascular office and get her venous ultrasound and opinion for intervention as soon as possible 3. She should continue her daily activities but when she gets a chance she should elevate her legs as much as possible. 4. After discussing this she says she is going to be compliant. Electronic Signature(s) Signed: 06/10/2015 10:03:46 AM By: Evlyn Kanner MD, FACS Entered By: Evlyn Kanner on 06/10/2015 10:03:46 Balke, Oriya TMarland Kitchen ([id number]) -------------------------------------------------------------------------------- SuperBill Details Patient Name: [name] Date of Service: 06/10/2015 Medical Record Number: [id number] Patient Account Number: [id number] Date of Birth/Sex: 11-Oct-1955 (59 y.o. 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 Facility Procedures CPT4: Description Modifier Quantity Code [id number] (Facility Use Only) [phone] - APPLY MULTLAY COMPRS LWR RT 1 LEG Physician Procedures CPT4 Code Description: [id number] - WC PHYS LEVEL 3 - EST PT ICD-10 Description Diagnosis E11.622 Type 2 diabetes mellitus with other skin ulcer  I83.013 Varicose veins of right lower extremity with ulcer L97.312 Non-pressure chronic ulcer of right  ankle with fat Modifier: of ankle layer expose Quantity: 1 d Electronic Signature(s) Signed: 06/10/2015 10:03:59 AM By: Evlyn Kanner MD, FACS Entered By: Evlyn Kanner on 06/10/2015 10:03:59

## 2015-06-11 NOTE — Progress Notes (Signed)
Tammy Brewer, [name] ([id number]) Visit Report for 06/10/2015 Arrival Information Details Patient Name: Tammy Brewer, [name] Date of Service: 06/10/2015 9:30 AM Medical Record Number: [id number] Patient Account Number: [id number] Date of Birth/Sex: June 14, 1956 (59 y.o. Female) Treating RN: Curtis Sitesorthy, Joanna Primary Care Physician: Einar CrowAnderson, Marshall Other Clinician: Referring Physician: Einar CrowAnderson, Marshall Treating Physician/Extender: Elayne SnarePARKER, PETER Weeks in Treatment: 3 Visit Information History Since Last Visit Added or deleted any medications: No Patient Arrived: Ambulatory Any new allergies or adverse reactions: No Arrival Time: 09:39 Had a fall or experienced change in No Accompanied By: translator activities of daily living that may affect Transfer Assistance: None risk of falls: Patient Identification Verified: Yes Signs or symptoms of abuse/neglect since last No Secondary Verification Process Yes visito Completed: Hospitalized since last visit: No Patient Has Alerts: Yes Pain Present Now: No Patient Alerts: DMII Electronic Signature(s) Signed: 06/10/2015 5:48:25 PM By: Curtis Sitesorthy, Joanna Entered By: Curtis Sitesorthy, Joanna on 06/10/2015 09:42:51 [name], [name] ([id number]) -------------------------------------------------------------------------------- Encounter Discharge Information Details Patient Name: Tammy Brewer, [name] Date of Service: 06/10/2015 9:30 AM Medical Record Number: [id number] Patient Account Number: [id number] Date of Birth/Sex: June 14, 1956 (59 y.o. Female) Treating RN: Curtis Sitesorthy, Joanna Primary Care Physician: Einar CrowAnderson, Marshall Other Clinician: Referring Physician: Einar CrowAnderson, Marshall Treating Physician/Extender: Rudene ReBritto, Errol Weeks in Treatment: 3 Encounter Discharge Information Items Discharge Pain Level: 0 Discharge Condition: Stable Ambulatory Status: Ambulatory Discharge Destination: Home Transportation: Private Auto Accompanied By: translator Schedule Follow-up  Appointment: Yes Medication Reconciliation completed and provided to Patient/Care No Torrell Krutz: Provided on Clinical Summary of Care: 06/10/2015 Form Type Recipient Paper Patient MG Electronic Signature(s) Signed: 06/10/2015 10:16:19 AM By: Gwenlyn PerkingMoore, Shelia Entered By: Gwenlyn PerkingMoore, Shelia on 06/10/2015 10:16:19 [name], [name] ([id number]) -------------------------------------------------------------------------------- Lower Extremity Assessment Details Patient Name: Tammy Brewer, [name] Date of Service: 06/10/2015 9:30 AM Medical Record Number: [id number] Patient Account Number: [id number] Date of Birth/Sex: June 14, 1956 (59 y.o. Female) Treating RN: Curtis Sitesorthy, Joanna Primary Care Physician: Einar CrowAnderson, Marshall Other Clinician: Referring Physician: Einar CrowAnderson, Marshall Treating Physician/Extender: Rudene ReBritto, Errol Weeks in Treatment: 3 Edema Assessment Assessed: [Left: No] [Right: No] Edema: [Left: N] [Right: o] Calf Left: Right: Point of Measurement: 30 cm From Medial Instep cm 36.1 cm Ankle Left: Right: Point of Measurement: 10 cm From Medial Instep cm 19.5 cm Vascular Assessment Pulses: Posterior Tibial Dorsalis Pedis Palpable: [Right:Yes] Extremity colors, hair growth, and conditions: Extremity Color: [Right:Hyperpigmented] Hair Growth on Extremity: [Right:No] Temperature of Extremity: [Right:Warm] Capillary Refill: [Right:< 3 seconds] Electronic Signature(s) Signed: 06/10/2015 5:48:25 PM By: Curtis Sitesorthy, Joanna Entered By: Curtis Sitesorthy, Joanna on 06/10/2015 09:46:04 Tammy Brewer, [name] ([id number]) -------------------------------------------------------------------------------- Multi Wound Chart Details Patient Name: Tammy Brewer, [name] Date of Service: 06/10/2015 9:30 AM Medical Record Number: [id number] Patient Account Number: [id number] Date of Birth/Sex: June 14, 1956 (59 y.o. 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] [N/A:N/A] Comorbid History: [1:Type II Diabetes, Osteoarthritis, Neuropathy] [N/A:N/A] Date Acquired: [1:02/18/2015] [N/A:N/A] Weeks of Treatment: [1:3] [N/A:N/A] Wound Status: [1:Open] [N/A:N/A] Measurements L x W x D 0.3x0.4x0.1 [N/A:N/A] (cm) Area (cm) : [1:0.094] [N/A:N/A] Volume (cm) : [1:0.009] [N/A:N/A] % Reduction in Area: [1:96.00%] [N/A:N/A] % Reduction in Volume: 98.10% [N/A:N/A] Classification: [1:Grade 1] [N/A:N/A] Exudate Amount: [1:Small] [N/A:N/A] Exudate  Type: [1:Serous] [N/A:N/A] Exudate Color: [1:amber] [N/A:N/A] Wound Margin: [1:Flat and Intact] [N/A:N/A] Granulation Amount: [1:Large (67-100%)] [N/A:N/A] Granulation Quality: [1:Pink] [N/A:N/A] Necrotic Amount: [1:None Present (0%)] [N/A:N/A] Exposed Structures: [1:Fascia: No Fat: No Tendon: No Muscle: No Joint: No] [N/A:N/A] Bone: No Limited to Skin Breakdown Epithelialization: Small (1-33%) N/A N/A Periwound Skin Texture: Edema: No N/A N/A Excoriation: No Induration: No Callus: No Crepitus: No Fluctuance: No Friable: No Rash: No Scarring: No Periwound Skin Moist: Yes N/A N/A Moisture: Dry/Scaly: Yes Maceration: No Periwound Skin Color: Atrophie Blanche: No N/A N/A Cyanosis: No Ecchymosis: No Erythema: No Hemosiderin Staining: No Mottled: No Pallor: No Rubor: No Temperature: No Abnormality N/A N/A Tenderness on No N/A N/A Palpation: Wound Preparation: Ulcer Cleansing: N/A N/A Rinsed/Irrigated  with Saline Topical Anesthetic Applied: Other: lidocaine 4% Treatment Notes Electronic Signature(s) Signed: 06/10/2015 5:48:25 PM By: Curtis Sites Entered By: Curtis Sites on 06/10/2015 09:47:36 Tammy Brewer, Tammy Brewer Kitchen ([id number]) -------------------------------------------------------------------------------- Multi-Disciplinary Care Plan Details Patient Name: Tammy Brewer Brewer. Date of Service: 06/10/2015 9:30 AM Medical Record Number: [id number] Patient Account Number: [id number] Date of Birth/Sex: 07/20/1956 (59 y.o. Female) Treating RN: Curtis Sites Primary Care Physician: Einar Crow Other Clinician: Referring Physician: Einar Crow Treating Physician/Extender: Rudene Re in Treatment: 3 Active Inactive Orientation to the Wound Care Program Nursing Diagnoses: Knowledge deficit related to the wound healing center program Goals: Patient/caregiver will verbalize understanding of the Wound Healing Center Program Date Initiated: 05/20/2015 Goal Status: Active Interventions: Provide education on orientation to the wound center Notes: Wound/Skin Impairment Nursing Diagnoses: Impaired tissue integrity Goals: Patient/caregiver will verbalize understanding of skin care regimen Date Initiated: 05/20/2015 Goal Status: Active Ulcer/skin breakdown will heal within 14 weeks Date Initiated: 05/20/2015 Goal Status: Active Interventions: Assess patient/caregiver ability to perform ulcer/skin care regimen upon admission and as needed Provide education on ulcer and skin care Treatment Activities: Topical wound management initiated : 06/10/2015 Notes: Tammy Brewer, Tammy Brewer ([id number]) Electronic Signature(s) Signed: 06/10/2015 5:48:25 PM By: Curtis Sites Entered By: Curtis Sites on 06/10/2015 09:46:58 Robley, [name] ([id number]) -------------------------------------------------------------------------------- Patient/Caregiver Education Details Patient Name:  Tammy Brewer Brewer. Date of Service: 06/10/2015 9:30 AM Medical Record Number: [id number] Patient Account Number: [id number] Date of Birth/Gender: Dec 28, 1955 (59 y.o. Female) Treating RN: Curtis Sites Primary Care Physician: Einar Crow Other Clinician: Referring Physician: Einar Crow Treating Physician/Extender: Rudene Re in Treatment: 3 Education Assessment Education Provided To: Patient Education Topics Provided Venous: Handouts: Controlling Swelling with Multilayered Compression Wraps Methods: Explain/Verbal Responses: State content correctly Electronic Signature(s) Signed: 06/10/2015 5:48:25 PM By: Curtis Sites Entered By: Curtis Sites on 06/10/2015 10:13:18 Uptain, [name] ([id number]) -------------------------------------------------------------------------------- Wound Assessment Details Patient Name: Moyano, Temprence Brewer. Date of Service: 06/10/2015 9:30 AM Medical Record Number: [id number] Patient Account Number: [id number] Date of Birth/Sex: [birth year] (59 y.o. Female) Treating RN: Curtis Sites Primary Care Physician: Einar Crow Other Clinician: Referring Physician: Einar Crow Treating Physician/Extender: Rudene Re in Treatment: 3 Wound Status Wound Number: 1 Primary Etiology: Diabetic Wound/Ulcer of the Lower Extremity Wound Location: Right Malleolus - Medial Secondary Venous Leg Ulcer Wounding Event: Pimple Etiology: Date Acquired: 02/18/2015 Wound Status: Open Weeks Of Treatment: 3 Comorbid Type II Diabetes, Osteoarthritis, Clustered Wound: No History: Neuropathy Photos Photo Uploaded By: Curtis Sites on 06/10/2015 12:38:33 Wound Measurements Length: (cm) 0.3 Width: (cm) 0.4 Depth: (cm) 0.1 Area: (cm) 0.094 Volume: (cm) 0.009 % Reduction in Area: 96% % Reduction in Volume: 98.1% Epithelialization: Small (1-33%) Tunneling: No Undermining: No Wound Description Classification: Grade 1 Wound  Margin: Flat and Intact Exudate Amount:  Small Exudate Type: Serous Exudate Color: amber Foul Odor After Cleansing: No Wound Bed Granulation Amount: Large (67-100%) Exposed Structure Granulation Quality: Pink Fascia Exposed: No Necrotic Amount: None Present (0%) Fat Layer Exposed: No Tendon Exposed: No Neidlinger, Amerah Brewer. ([id number]) Muscle Exposed: No Joint Exposed: No Bone Exposed: No Limited to Skin Breakdown Periwound Skin Texture Texture Color No Abnormalities Noted: No No Abnormalities Noted: No Callus: No Atrophie Blanche: No Crepitus: No Cyanosis: No Excoriation: No Ecchymosis: No Fluctuance: No Erythema: No Friable: No Hemosiderin Staining: No Induration: No Mottled: No Localized Edema: No Pallor: No Rash: No Rubor: No Scarring: No Temperature / Pain Moisture Temperature: No Abnormality No Abnormalities Noted: No Dry / Scaly: Yes Maceration: No Moist: Yes Wound Preparation Ulcer Cleansing: Rinsed/Irrigated with Saline Topical Anesthetic Applied: Other: lidocaine 4%, Treatment Notes Wound #1 (Right, Medial Malleolus) 1. Cleansed with: Clean wound with Normal Saline 2. Anesthetic Topical Lidocaine 4% cream to wound bed prior to debridement 4. Dressing Applied: Aquacel Ag 5. Secondary Dressing Applied ABD Pad 7. Secured with Henriette Combs to Right Lower Extremity Electronic Signature(s) Signed: 06/10/2015 5:48:25 PM By: Curtis Sites Entered By: Curtis Sites on 06/10/2015 09:46:47 Fraleigh, Kamalani Brewer. ([id number]) -------------------------------------------------------------------------------- Vitals Details Patient Name: Tammy Brewer Brewer. Date of Service: 06/10/2015 9:30 AM Medical Record Number: [id number] Patient Account Number: [id number] Date of Birth/Sex: 05-02-1956 (59 y.o. Female) Treating RN: Curtis Sites Primary Care Physician: Einar Crow Other Clinician: Referring Physician: Einar Crow Treating Physician/Extender: Rudene Re in Treatment: 3 Vital Signs Time Taken: 09:42 Pulse (bpm): 80 Height (in): 61 Respiratory Rate (breaths/min): 18 Weight (lbs): 160 Blood Pressure (mmHg): 107/58 Body Mass Index (BMI): 30.2 Reference Range: 80 - 120 mg / dl Electronic Signature(s) Signed: 06/10/2015 5:48:25 PM By: Curtis Sites Entered By: Curtis Sites on 06/10/2015 09:43:14

## 2015-06-17 ENCOUNTER — Encounter: Payer: Commercial Indemnity | Admitting: Surgery

## 2015-06-17 DIAGNOSIS — L97312 Non-pressure chronic ulcer of right ankle with fat layer exposed: Secondary | ICD-10-CM | POA: Diagnosis not present

## 2015-06-18 NOTE — Progress Notes (Addendum)
Tammy Brewer, [name] ([id number]) Visit Report for 06/17/2015 Arrival Information Details Patient Name: Tammy Brewer, [name] Date of Service: 06/17/2015 8:45 AM Medical Record Number: [id number] Patient Account Number: [id number] Date of Birth/Sex: [birth year] (59 y.o. Female) Treating RN: Curtis Sitesorthy, Joanna Primary Care Physician: Einar CrowAnderson, Marshall Other Clinician: Referring Physician: Einar CrowAnderson, Marshall Treating Physician/Extender: Rudene ReBritto, Errol Weeks in Treatment: 4 Visit Information History Since Last Visit Added or deleted any medications: No Patient Arrived: Ambulatory Any new allergies or adverse reactions: No Arrival Time: 09:01 Had a fall or experienced change in No Accompanied By: translator activities of daily living that may affect Transfer Assistance: None risk of falls: Patient Identification Verified: Yes Signs or symptoms of abuse/neglect since last No Secondary Verification Process Yes visito Completed: Hospitalized since last visit: No Patient Has Alerts: Yes Pain Present Now: No Patient Alerts: DMII Electronic Signature(s) Signed: 06/17/2015 5:24:55 PM By: Curtis Sitesorthy, Joanna Entered By: Curtis Sitesorthy, Joanna on 06/17/2015 09:02:20 Tammy Brewer, [name] ([id number]) -------------------------------------------------------------------------------- Clinic Level of Care Assessment Details Patient Name: [name] Date of Service: 06/17/2015 8:45 AM Medical Record Number: [id number] Patient Account Number: [id number] Date of Birth/Sex: [birth year] (59 y.o. Female) Treating RN: Curtis Sitesorthy, Joanna Primary Care Physician: Einar CrowAnderson, Marshall Other Clinician: Referring Physician: Einar CrowAnderson, Marshall Treating Physician/Extender: Rudene ReBritto, Errol Weeks in Treatment: 4 Clinic Level of Care Assessment Items TOOL 4 Quantity Score []  - Use when only an EandM is performed on FOLLOW-UP visit 0 ASSESSMENTS - Nursing Assessment / Reassessment X - Reassessment of Co-morbidities (includes updates in  patient status) 1 10 X - Reassessment of Adherence to Treatment Plan 1 5 ASSESSMENTS - Wound and Skin Assessment / Reassessment X - Simple Wound Assessment / Reassessment - one wound 1 5 []  - Complex Wound Assessment / Reassessment - multiple wounds 0 []  - Dermatologic / Skin Assessment (not related to wound area) 0 ASSESSMENTS - Focused Assessment X - Circumferential Edema Measurements - multi extremities 1 5 []  - Nutritional Assessment / Counseling / Intervention 0 X - Lower Extremity Assessment (monofilament, tuning fork, pulses) 1 5 []  - Peripheral Arterial Disease Assessment (using hand held doppler) 0 ASSESSMENTS - Ostomy and/or Continence Assessment and Care []  - Incontinence Assessment and Management 0 []  - Ostomy Care Assessment and Management (repouching, etc.) 0 PROCESS - Coordination of Care X - Simple Patient / Family Education for ongoing care 1 15 []  - Complex (extensive) Patient / Family Education for ongoing care 0 []  - Staff obtains ChiropractorConsents, Records, Test Results / Process Orders 0 []  - Staff telephones HHA, Nursing Homes / Clarify orders / etc 0 []  - Routine Transfer to another Facility (non-emergent condition) 0 Tammy Brewer, [name] ([id number]) []  - Routine Hospital Admission (non-emergent condition) 0 []  - New Admissions / Manufacturing engine[employer]surance Authorizations / Ordering NPWT, Apligraf, etc. 0 []  - Emergency Hospital Admission (emergent condition) 0 X - Simple Discharge Coordination 1 10 []  - Complex (extensive) Discharge Coordination 0 PROCESS - Special Needs []  - Pediatric / Minor Patient Management 0 []  - Isolation Patient Management 0 []  - Hearing / Language / Visual special needs 0 []  - Assessment of Community assistance (transportation, D/C planning, etc.) 0 []  - Additional assistance / Altered mentation 0 []  - Support Surface(s) Assessment (bed, cushion, seat, etc.) 0 INTERVENTIONS - Wound Cleansing / Measurement X - Simple Wound Cleansing - one wound 1 5 []  - Complex  Wound Cleansing - multiple wounds 0 X - Wound Imaging (photographs - any number of wounds) 1 5 []  - Wound Tracing (instead of photographs) 0 X -  Simple Wound Measurement - one wound 1 5  - Complex Wound Measurement - multiple wounds 0 INTERVENTIONS - Wound Dressings X - Small Wound Dressing one or multiple wounds 1 10  - Medium Wound Dressing one or multiple wounds 0  - Large Wound Dressing one or multiple wounds 0  - Application of Medications - topical 0  - Application of Medications - injection 0 INTERVENTIONS - Miscellaneous  - External ear exam 0 Tammy Brewer, [name] ([id number])  - Specimen Collection (cultures, biopsies, blood, body fluids, etc.) 0  - Specimen(s) / Culture(s) sent or taken to Lab for analysis 0  - Patient Transfer (multiple staff / Michiel Sites Lift / Similar devices) 0  - Simple Staple / Suture removal (25 or less) 0  - Complex Staple / Suture removal (26 or more) 0  - Hypo / Hyperglycemic Management (close monitor of Blood Glucose) 0  - Ankle / Brachial Index (ABI) - do not check if billed separately 0 X - Vital Signs 1 5 Has the patient been seen at the hospital within the last three years: Yes Total Score: 85 Level Of Care: New/Established - Level 3 Electronic Signature(s) Signed: 06/17/2015 5:24:55 PM By: Curtis Sites Entered By: Curtis Sites on 06/17/2015 09:19:11 [name] ([id number]) -------------------------------------------------------------------------------- Encounter Discharge Information Details Patient Name: [name] Date of Service: 06/17/2015 8:45 AM Medical Record Number: [id number] Patient Account Number: [id number] Date of Birth/Sex: 05-05-1956 (59 y.o. Female) Treating RN: Curtis Sites Primary Care Physician: Einar Crow Other Clinician: Referring Physician: Einar Crow Treating Physician/Extender: Rudene Re in Treatment: 4 Encounter Discharge Information Items Discharge  Pain Level: 0 Discharge Condition: Stable Ambulatory Status: Ambulatory Discharge Destination: Home Transportation: Private Auto Accompanied By: translator Schedule Follow-up Appointment: Yes Medication Reconciliation completed and provided to Patient/Care No Paxson Harrower: Provided on Clinical Summary of Care: 06/17/2015 Form Type Recipient Paper Patient MG Electronic Signature(s) Signed: 06/17/2015 9:34:17 AM By: Gwenlyn Perking Entered By: Gwenlyn Perking on 06/17/2015 09:34:16 Tammy Brewer, [name] ([id number]) -------------------------------------------------------------------------------- Lower Extremity Assessment Details Patient Name: Tammy Brewer, [name] Date of Service: 06/17/2015 8:45 AM Medical Record Number: [id number] Patient Account Number: [id number] Date of Birth/Sex: 06-27-1956 (59 y.o. Female) Treating RN: Curtis Sites Primary Care Physician: Einar Crow Other Clinician: Referring Physician: Einar Crow Treating Physician/Extender: Rudene Re in Treatment: 4 Edema Assessment Assessed: [Left: No] [Right: No] Edema: [Left: N] [Right: o] Calf Left: Right: Point of Measurement: 30 cm From Medial Instep cm 36.2 cm Ankle Left: Right: Point of Measurement: 10 cm From Medial Instep cm 19.4 cm Vascular Assessment Pulses: Posterior Tibial Dorsalis Pedis Palpable: [Right:Yes] Extremity colors, hair growth, and conditions: Extremity Color: [Right:Hyperpigmented] Hair Growth on Extremity: [Right:Yes] Temperature of Extremity: [Right:Warm] Capillary Refill: [Right:< 3 seconds] Toe Nail Assessment Left: Right: Thick: No Discolored: No Deformed: No Improper Length and Hygiene: No Electronic Signature(s) Signed: 06/17/2015 5:24:55 PM By: Curtis Sites Entered By: Curtis Sites on 06/17/2015 09:08:44 Tammy Brewer, [name] ([id number]) -------------------------------------------------------------------------------- Multi Wound Chart Details Patient Name:  Tammy Brewer, [name] Date of Service: 06/17/2015 8:45 AM Medical Record Number: [id number] Patient Account Number: [id number] Date of Birth/Sex: Mar 13, 1956 (59 y.o. 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] [N/A:N/A] Comorbid History: [1:Type II Diabetes, Osteoarthritis, Neuropathy] [N/A:N/A] Date Acquired: [1:02/18/2015] [N/A:N/A] Weeks of Treatment: [1:4] [N/A:N/A] Wound Status: [1:Open] [N/A:N/A] Measurements L x W x D 0.1x0.1x0.1 [N/A:N/A] (cm) Area (cm) : [1:0.008] [N/A:N/A] Volume (cm) : [1:0.001] [N/A:N/A] % Reduction in Area: [1:99.70%] [N/A:N/A] % Reduction in Volume: 99.80% [N/A:N/A] Classification: [1:Grade 1] [N/A:N/A] Exudate Amount: [1:Small] [N/A:N/A] Exudate Type: [1:Serous] [N/A:N/A] Exudate Color: [1:amber] [N/A:N/A] Wound Margin: [1:Flat and Intact] [N/A:N/A] Granulation Amount: [1:Large (67-100%)] [N/A:N/A] Granulation Quality: [1:Pink] [N/A:N/A] Necrotic Amount: [1:None Present (0%)] [N/A:N/A] Exposed Structures: [1:Fascia: No Fat: No Tendon: No Muscle: No Joint: No] [N/A:N/A] Bone: No Limited to Skin Breakdown Epithelialization: Medium (34-66%) N/A N/A Periwound Skin Texture: Edema: No N/A N/A Excoriation: No Induration: No Callus: No Crepitus: No Fluctuance: No Friable: No Rash: No Scarring: No Periwound Skin Moist: Yes N/A N/A Moisture: Dry/Scaly: Yes Maceration: No Periwound Skin  Color: Atrophie Blanche: No N/A N/A Cyanosis: No Ecchymosis: No Erythema: No Hemosiderin Staining: No Mottled: No Pallor: No Rubor: No Temperature: No Abnormality N/A N/A Tenderness on No N/A N/A Palpation: Wound Preparation: Ulcer Cleansing: N/A N/A Rinsed/Irrigated with Saline Topical Anesthetic Applied: Other: lidocaine 4% Treatment Notes Electronic Signature(s) Signed: 06/17/2015 5:24:55 PM By: Curtis Sites Entered By: Curtis Sites on 06/17/2015 09:09:01 Tammy Brewer, Tammy Brewer ([id number]) -------------------------------------------------------------------------------- Multi-Disciplinary Care Plan Details Patient Name: [name] Date of Service: 06/17/2015 8:45 AM Medical Record Number: [id number] Patient Account Number: [id number] Date of Birth/Sex: 19-Dec-1955 (59 y.o. Female) Treating RN: Curtis Sites Primary Care Physician: Einar Crow Other Clinician: Referring Physician: Einar Crow Treating Physician/Extender: Rudene Re in Treatment: 4 Active Inactive Electronic Signature(s) Signed: 08/15/2015 6:22:50 PM By: Elliot Gurney RN, [name], [name], [name] Signed: 09/05/2015 5:07:02 PM By: Curtis Sites Previous Signature: 06/17/2015 5:24:55 PM Version By: Curtis Sites Entered By: Elliot Gurney RN, [name], Kim on 08/01/2015 08:50:42 Tammy Brewer, Tammy Brewer ([id number]) -------------------------------------------------------------------------------- Patient/Caregiver Education Details Patient Name: Tammy Brewer, [name] Date of Service: 06/17/2015 8:45 AM Medical Record Number: [id number] Patient Account Number: [id number] Date of Birth/Gender: 1956/05/02 (59 y.o. Female) Treating RN: Curtis Sites Primary Care Physician: Einar Crow Other Clinician: Referring Physician: Einar Crow Treating Physician/Extender: Rudene Re in Treatment: 4 Education Assessment Education Provided To: Patient Education Topics Provided Venous: Handouts: Other:  keep apt with [facility] Methods: Explain/Verbal Responses: State content correctly Electronic Signature(s) Signed: 06/17/2015 5:24:55 PM By: Curtis Sites Entered By: Curtis Sites on 06/17/2015 09:16:16 Tammy Brewer, [name] ([id number]) -------------------------------------------------------------------------------- Wound Assessment Details Patient Name: Molstad, Jannelle Brewer. Date of Service: 06/17/2015 8:45 AM Medical Record Number: [id number] Patient Account Number: [id number] Date of Birth/Sex: [birth year] (59 y.o. Female) Treating RN: Curtis Sites Primary Care Physician: Einar Crow Other Clinician: Referring Physician: Einar Crow Treating Physician/Extender: Rudene Re in Treatment: 4 Wound Status Wound Number: 1 Primary Etiology: Diabetic Wound/Ulcer of the Lower Extremity Wound Location: Right Malleolus - Medial Secondary Venous Leg Ulcer Wounding Event: Pimple Etiology: Date Acquired: 02/18/2015 Wound Status: Open Weeks Of Treatment: 4 Comorbid Type II Diabetes, Osteoarthritis, Clustered Wound: No History: Neuropathy Photos Photo Uploaded By: Curtis Sites on 06/17/2015 10:14:54 Wound Measurements Length: (cm) 0.1 Width: (cm) 0.1 Depth: (cm) 0.1 Area: (cm) 0.008 Volume: (cm) 0.001 % Reduction in Area: 99.7% % Reduction in Volume: 99.8% Epithelialization: Medium (34-66%) Tunneling: No Undermining: No Wound Description Classification: Grade 1 Wound Margin: Flat and Intact Exudate Amount: Small Exudate Type: Serous Exudate Color: amber Foul Odor After Cleansing: No Wound Bed Granulation Amount: Large (67-100%) Exposed Structure Granulation Quality:  Pink Fascia Exposed: No Necrotic Amount: None Present (0%) Fat Layer Exposed: No Tendon Exposed: No [place], Mitali Brewer. ([id number]) Muscle Exposed: No Joint Exposed: No Bone Exposed: No Limited to Skin Breakdown Periwound Skin Texture Texture Color No Abnormalities Noted: No No  Abnormalities Noted: No Callus: No Atrophie Blanche: No Crepitus: No Cyanosis: No Excoriation: No Ecchymosis: No Fluctuance: No Erythema: No Friable: No Hemosiderin Staining: No Induration: No Mottled: No Localized Edema: No Pallor: No Rash: No Rubor: No Scarring: No Temperature / Pain Moisture Temperature: No Abnormality No Abnormalities Noted: No Dry / Scaly: Yes Maceration: No Moist: Yes Wound Preparation Ulcer Cleansing: Rinsed/Irrigated with Saline Topical Anesthetic Applied: Other: lidocaine 4%, Electronic Signature(s) Signed: 06/17/2015 5:24:55 PM By: Curtis Sites Entered By: Curtis Sites on 06/17/2015 09:07:20 Rajewski, Yuri Brewer. ([id number]) -------------------------------------------------------------------------------- Vitals Details Patient Name: [name] Date of Service: 06/17/2015 8:45 AM Medical Record Number: [id number] Patient Account Number: [id number] Date of Birth/Sex: 1955/12/27 (59 y.o. Female) Treating RN: Curtis Sites Primary Care Physician: Einar Crow Other Clinician: Referring Physician: Einar Crow Treating Physician/Extender: Rudene Re in Treatment: 4 Vital Signs Time Taken: 09:02 Temperature (F): 98.3 Height (in): 61 Pulse (bpm): 75 Weight (lbs): 160 Respiratory Rate (breaths/min): 16 Body Mass Index (BMI): 30.2 Blood Pressure (mmHg): 127/69 Reference Range: 80 - 120 mg / dl Electronic Signature(s) Signed: 06/17/2015 5:24:55 PM By: Curtis Sites Entered By: Curtis Sites on 06/17/2015 09:04:27

## 2015-06-18 NOTE — Progress Notes (Signed)
Tammy Brewer, Tammy Brewer ([id number]) Visit Report for 06/17/2015 Chief Complaint Document Details Patient Name: Tammy Brewer, Tammy Brewer 06/17/2015 8:45 Date of Service: AM Medical Record [id number] Number: Patient Account Number: [id number] 1956/04/05 (59 y.o. Treating RN: Curtis Sites Date of Birth/Sex: Female) Other Clinician: Primary Care Physician: Einar Crow Treating Evlyn Kanner Referring Physician: Einar Crow Physician/Extender: Tania Ade in Treatment: 4 Information Obtained from: Patient Chief Complaint Patient presents for treatment of an open ulcer due to venous insufficiency. The patient who has diabetes mellitus has had an ulceration on the right ankle for about 3 months Electronic Signature(s) Signed: 06/17/2015 9:23:05 AM By: Evlyn Kanner MD, FACS Entered By: Evlyn Kanner on 06/17/2015 09:23:04 Tammy Brewer, Tammy Brewer ([id number]) -------------------------------------------------------------------------------- HPI Details Patient Name: Tammy Brewer, Tammy Brewer 06/17/2015 8:45 Date of Service: AM Medical Record [id number] Number: Patient Account Number: [id number] 1956-03-15 (59 y.o. Treating RN: Curtis Sites Date of Birth/Sex: Female) Other Clinician: Primary Care Physician: Einar Crow Treating Evlyn Kanner Referring Physician: Einar Crow Physician/Extender: Tania Ade in Treatment: 4 History of Present Illness Location: ulcer on the right ankle Quality: Patient reports experiencing burning to affected area(s). Severity: Patient states wound are getting worse. Duration: Patient has had the wound for > 3 months prior to seeking treatment at the wound center Timing: Pain in wound is Intermittent (comes and goes Context: The wound appeared gradually over time Modifying Factors: Patient's employment requires standing for long periods of time Associated Signs and Symptoms: Patient reports presence of swelling HPI Description: 59 year old patient who comes from  Dr. Alva Garnet office with the chief complaints of having a foot ulcer with redness for about 2 weeks. The patient has a past medical history of diabetes mellitus type 2 which is uncontrolled with neuropathy, hypercholesterolemia, osteoarthritis, major depression and has had a past history of appendectomy, cholecystectomy and a DandC. She has never been a smoker and her last hemoglobin A1c done in February 2016 was 8.2. As per our office note she has recently seen podiatry and also has some vascular workup done recently and saw Dr. Gilda Crease on 04/18/2015. Clinically they found varicose veins of the lower extremity with both ulcers of the ankle and inflammation. There was an active ulcer on the right lower extremity. Lower extremity venous duplex exam was recommended and the patient was asked to wear compression stockings. Venous duplex studies are still pending. 05/27/2015 -- she has still not done her venous duplex studies and understand she will be traveling out of town this week and does not want to wear a compression wrap and will get herself some compression stockings. 06/10/2015 -- she is back from her travels and was wearing a compression stocking as per her history. She has not yet got her vascular workup and intervention done. 06/17/2015 -- we have always seen this patient with a Spanish-speaking interpreter but she still does not understand that the venous duplex studies have to be done or she chooses not to follow our advice. She did not keep the wound was prepped for more than 3 days and says she has been wearing compression stockings but today she comes with a Tubigrip. As far as I'm concerned this patient is not being compliant. Electronic Signature(s) Signed: 06/17/2015 9:24:24 AM By: Evlyn Kanner MD, FACS Entered By: Evlyn Kanner on 06/17/2015 09:24:24 SHANNETTE, TABARES  ([id number]) -------------------------------------------------------------------------------- Physical Exam Details Patient Name: Tammy Brewer, Tammy Brewer 06/17/2015 8:45 Date of Service: AM Medical Record [id number] Number: Patient Account Number: [id number] [birth year] (59 y.o. Treating RN: Curtis Sites Date of Birth/Sex: Female) Other Clinician:  Primary Care Physician: Einar Crow Treating Evlyn Kanner Referring Physician: Einar Crow Physician/Extender: Tania Ade in Treatment: 4 Constitutional . Pulse regular. Respirations normal and unlabored. Afebrile. . Eyes Nonicteric. Reactive to light. Ears, Nose, Mouth, and Throat Lips, teeth, and gums WNL.Marland Kitchen Moist mucosa without lesions . Neck supple and nontender. No palpable supraclavicular or cervical adenopathy. Normal sized without goiter. Respiratory WNL. No retractions.. Cardiovascular Pedal Pulses WNL. No clubbing, cyanosis or edema. Lymphatic No adneopathy. No adenopathy. No adenopathy. Musculoskeletal Adexa without tenderness or enlargement.. Digits and nails w/o clubbing, cyanosis, infection, petechiae, ischemia, or inflammatory conditions.. Integumentary (Hair, Skin) No suspicious lesions. No crepitus or fluctuance. No peri-wound warmth or erythema. No masses.Marland Kitchen Psychiatric Judgement and insight Intact.. No evidence of depression, anxiety, or agitation.. Notes the wound is smaller and cleaner and the edema is minimal. Electronic Signature(s) Signed: 06/17/2015 9:24:56 AM By: Evlyn Kanner MD, FACS Entered By: Evlyn Kanner on 06/17/2015 09:24:56 Tammy Brewer, Tammy Brewer ([id number]) -------------------------------------------------------------------------------- Physician Orders Details Patient Name: Tammy Brewer, Tammy Brewer 06/17/2015 8:45 Date of Service: AM Medical Record [id number] Number: Patient Account Number: [id number] Oct 16, 1955 (59 y.o. Treating RN: Curtis Sites Date of Birth/Sex: Female) Other  Clinician: Primary Care Physician: Einar Crow Treating Evlyn Kanner Referring Physician: Einar Crow Physician/Extender: Tania Ade in Treatment: 4 Verbal / Phone Orders: Yes Clinician: Curtis Sites Read Back and Verified: Yes Diagnosis Coding Wound Cleansing Wound #1 Right,Medial Malleolus o Clean wound with Normal Saline. o Cleanse wound with mild soap and water Anesthetic Wound #1 Right,Medial Malleolus o Topical Lidocaine 4% cream applied to wound bed prior to debridement Primary Wound Dressing Wound #1 Right,Medial Malleolus o Aquacel Ag Secondary Dressing Wound #1 Right,Medial Malleolus o Gauze and Kerlix/Conform Dressing Change Frequency Wound #1 Right,Medial Malleolus o Change dressing every day. Follow-up Appointments Wound #1 Right,Medial Malleolus o Return Appointment in 1 week. o Nurse Visit as needed Edema Control o Patient to wear own compression stockings Additional Orders / Instructions Wound #1 Right,Medial Malleolus o Increase protein intake. Tammy Brewer, Tammy Brewer ([id number]) Electronic Signature(s) Signed: 06/17/2015 2:14:21 PM By: Evlyn Kanner MD, FACS Signed: 06/17/2015 5:24:55 PM By: Curtis Sites Entered By: Curtis Sites on 06/17/2015 09:18:30 Tammy Brewer, Tammy Brewer ([id number]) -------------------------------------------------------------------------------- Problem List Details Patient Name: Tammy Brewer, Tammy Brewer 06/17/2015 8:45 Date of Service: AM Medical Record [id number] Number: Patient Account Number: [id number] 1956/04/22 (59 y.o. Treating RN: Curtis Sites Date of Birth/Sex: Female) Other Clinician: Primary Care Physician: Einar Crow Treating Evlyn Kanner Referring Physician: Einar Crow Physician/Extender: Tania Ade in Treatment: 4 Active Problems ICD-10 Encounter Code Description Active Date Diagnosis E11.622 Type 2 diabetes mellitus with other skin ulcer 05/20/2015 Yes I83.013 Varicose veins  of right lower extremity with ulcer of ankle 05/20/2015 Yes L97.312 Non-pressure chronic ulcer of right ankle with fat layer 05/20/2015 Yes exposed Inactive Problems Resolved Problems Electronic Signature(s) Signed: 06/17/2015 9:22:56 AM By: Evlyn Kanner MD, FACS Entered By: Evlyn Kanner on 06/17/2015 09:22:56 Christley, Grandville Silos ([id number]) -------------------------------------------------------------------------------- Progress Note Details Patient Name: Tammy Brewer, Tammy Brewer 06/17/2015 8:45 Date of Service: AM Medical Record [id number] Number: Patient Account Number: [id number] [birth year] (59 y.o. Treating RN: Curtis Sites Date of Birth/Sex: Female) Other Clinician: Primary Care Physician: Einar Crow Treating Evlyn Kanner Referring Physician: Einar Crow Physician/Extender: Tania Ade in Treatment: 4 Subjective Chief Complaint Information obtained from Patient Patient presents for treatment of an open ulcer due to venous insufficiency. The patient who has diabetes mellitus has had an ulceration on the right ankle for about 3 months History of Present Illness (HPI) The following HPI elements were documented for the  patient's wound: Location: ulcer on the right ankle Quality: Patient reports experiencing burning to affected area(s). Severity: Patient states wound are getting worse. Duration: Patient has had the wound for > 3 months prior to seeking treatment at the wound center Timing: Pain in wound is Intermittent (comes and goes Context: The wound appeared gradually over time Modifying Factors: Patient's employment requires standing for long periods of time Associated Signs and Symptoms: Patient reports presence of swelling 59 year old patient who comes from Dr. Gaynell Face Anderson's office with the chief complaints of having a foot ulcer with redness for about 2 weeks. The patient has a past medical history of diabetes mellitus type 2 which is uncontrolled with  neuropathy, hypercholesterolemia, osteoarthritis, major depression and has had a past history of appendectomy, cholecystectomy and a DandC. She has never been a smoker and her last hemoglobin A1c done in February 2016 was 8.2. As per our office note she has recently seen podiatry and also has some vascular workup done recently and saw Dr. Gilda Crease on 04/18/2015. Clinically they found varicose veins of the lower extremity with both ulcers of the ankle and inflammation. There was an active ulcer on the right lower extremity. Lower extremity venous duplex exam was recommended and the patient was asked to wear compression stockings. Venous duplex studies are still pending. 05/27/2015 -- she has still not done her venous duplex studies and understand she will be traveling out of town this week and does not want to wear a compression wrap and will get herself some compression stockings. 06/10/2015 -- she is back from her travels and was wearing a compression stocking as per her history. She has not yet got her vascular workup and intervention done. 06/17/2015 -- we have always seen this patient with a Spanish-speaking interpreter but she still does not understand that the venous duplex studies have to be done or she chooses not to follow our advice. She did not keep the wound was prepped for more than 3 days and says she has been wearing compression stockings but today she comes with a Tubigrip. As far as I'm concerned this patient is not being compliant. Tammy Brewer, Tammy T. ([id number]) Objective Constitutional Pulse regular. Respirations normal and unlabored. Afebrile. Vitals Time Taken: 9:02 AM, Height: 61 in, Weight: 160 lbs, BMI: 30.2, Temperature: 98.3 F, Pulse: 75 bpm, Respiratory Rate: 16 breaths/min, Blood Pressure: 127/69 mmHg. Eyes Nonicteric. Reactive to light. Ears, Nose, Mouth, and Throat Lips, teeth, and gums WNL.Marland Kitchen Moist mucosa without lesions . Neck supple and nontender. No  palpable supraclavicular or cervical adenopathy. Normal sized without goiter. Respiratory WNL. No retractions.. Cardiovascular Pedal Pulses WNL. No clubbing, cyanosis or edema. Lymphatic No adneopathy. No adenopathy. No adenopathy. Musculoskeletal Adexa without tenderness or enlargement.. Digits and nails w/o clubbing, cyanosis, infection, petechiae, ischemia, or inflammatory conditions.Marland Kitchen Psychiatric Judgement and insight Intact.. No evidence of depression, anxiety, or agitation.. General Notes: the wound is smaller and cleaner and the edema is minimal. Integumentary (Hair, Skin) No suspicious lesions. No crepitus or fluctuance. No peri-wound warmth or erythema. No masses.. Wound #1 status is Open. Original cause of wound was Pimple. The wound is located on the Right,Medial Malleolus. The wound measures 0.1cm length x 0.1cm width x 0.1cm depth; 0.008cm^2 area and 0.001cm^3 volume. The wound is limited to skin breakdown. There is no tunneling or undermining noted. There is a small amount of serous drainage noted. The wound margin is flat and intact. There is large (67- Tammy Brewer, Kelce T. ([id number]) 100%) pink granulation within the wound  bed. There is no necrotic tissue within the wound bed. The periwound skin appearance exhibited: Dry/Scaly, Moist. The periwound skin appearance did not exhibit: Callus, Crepitus, Excoriation, Fluctuance, Friable, Induration, Localized Edema, Rash, Scarring, Maceration, Atrophie Blanche, Cyanosis, Ecchymosis, Hemosiderin Staining, Mottled, Pallor, Rubor, Erythema. Periwound temperature was noted as No Abnormality. Assessment Active Problems ICD-10 E11.622 - Type 2 diabetes mellitus with other skin ulcer I83.013 - Varicose veins of right lower extremity with ulcer of ankle L97.312 - Non-pressure chronic ulcer of right ankle with fat layer exposed Plan Wound Cleansing: Wound #1 Right,Medial Malleolus: Clean wound with Normal Saline. Cleanse wound with  mild soap and water Anesthetic: Wound #1 Right,Medial Malleolus: Topical Lidocaine 4% cream applied to wound bed prior to debridement Primary Wound Dressing: Wound #1 Right,Medial Malleolus: Aquacel Ag Secondary Dressing: Wound #1 Right,Medial Malleolus: Gauze and Kerlix/Conform Dressing Change Frequency: Wound #1 Right,Medial Malleolus: Change dressing every day. Follow-up Appointments: Wound #1 Right,Medial Malleolus: Return Appointment in 1 week. Nurse Visit as needed Edema Control: Patient to wear own compression stockings Additional Orders / Instructions: Wound #1 Right,Medial Malleolus: Increase protein intake. [name], Aleni T. ([id number]) With the help of an interpreter I have advised her the following: 1. she should wear a dressing with silver alginate and Unna's boots. Since she is not being compliant with this I have asked her to wear her compression stockings. Dtailed instructions regarding this have been given through an interpreter. 2. She should get in touch with the vascular office and get her venous ultrasound and opinion for intervention as soon as possible 3. She should continue her daily activities but when she gets a chance she should elevate her legs as much as possible. 4. After discussing this she says she is going to be compliant. Electronic Signature(s) Signed: 06/17/2015 9:25:49 AM By: Evlyn KannerBritto, Tanekia Ryans MD, FACS Previous Signature: 06/17/2015 9:25:39 AM Version By: Evlyn KannerBritto, Mishawn Hemann MD, FACS Entered By: Evlyn KannerBritto, Dyamon Sosinski on 06/17/2015 09:25:48 Hayashida, Dorrine T. ([id number]) -------------------------------------------------------------------------------- SuperBill Details Patient Name: [name], Breane T. Date of Service: 06/17/2015 Medical Record Number: [id number] Patient Account Number: [id number] Date of Birth/Sex: 03-Sep-1955 (59 y.o. Female) Treating RN: Curtis Sitesorthy, Joanna Primary Care Physician: Einar CrowAnderson, Marshall Other Clinician: Referring Physician: Einar CrowAnderson,  Marshall Treating Physician/Extender: Rudene ReBritto, Shaunette Gassner Weeks in Treatment: 4 Diagnosis Coding ICD-10 Codes Code Description E11.622 Type 2 diabetes mellitus with other skin ulcer I83.013 Varicose veins of right lower extremity with ulcer of ankle L97.312 Non-pressure chronic ulcer of right ankle with fat layer exposed Facility Procedures CPT4 Code: [id number] Description: 99213 - WOUND CARE VISIT-LEV 3 EST PT Modifier: Quantity: 1 Physician Procedures CPT4 Code Description: [id number] 99213 - WC PHYS LEVEL 3 - EST PT ICD-10 Description Diagnosis E11.622 Type 2 diabetes mellitus with other skin ulcer I83.013 Varicose veins of right lower extremity with ulcer L97.312 Non-pressure chronic ulcer of right  ankle with fat Modifier: of ankle layer expose Quantity: 1 d Electronic Signature(s) Signed: 06/17/2015 9:26:13 AM By: Evlyn KannerBritto, Nataliah Hatlestad MD, FACS Entered By: Evlyn KannerBritto, Giann Obara on 06/17/2015 09:26:13

## 2015-06-24 ENCOUNTER — Ambulatory Visit: Payer: Self-pay | Admitting: Surgery

## 2015-10-31 ENCOUNTER — Other Ambulatory Visit: Payer: Self-pay | Admitting: Obstetrics and Gynecology

## 2015-10-31 DIAGNOSIS — Z1231 Encounter for screening mammogram for malignant neoplasm of breast: Secondary | ICD-10-CM

## 2015-11-07 ENCOUNTER — Ambulatory Visit: Payer: Commercial Indemnity | Attending: Obstetrics and Gynecology

## 2016-01-14 ENCOUNTER — Other Ambulatory Visit: Payer: Self-pay | Admitting: Internal Medicine

## 2016-01-14 DIAGNOSIS — Z0000 Encounter for general adult medical examination without abnormal findings: Secondary | ICD-10-CM | POA: Insufficient documentation

## 2016-01-14 DIAGNOSIS — Z1231 Encounter for screening mammogram for malignant neoplasm of breast: Secondary | ICD-10-CM

## 2016-01-23 ENCOUNTER — Ambulatory Visit: Payer: Commercial Indemnity | Attending: Internal Medicine

## 2016-04-10 ENCOUNTER — Emergency Department: Payer: Managed Care, Other (non HMO)

## 2016-04-10 ENCOUNTER — Emergency Department
Admission: EM | Admit: 2016-04-10 | Discharge: 2016-04-10 | Disposition: A | Discharge status: Home / Self Care | Payer: Managed Care, Other (non HMO) | Attending: Emergency Medicine | Admitting: Emergency Medicine

## 2016-04-10 ENCOUNTER — Encounter: Payer: Self-pay | Admitting: Emergency Medicine

## 2016-04-10 DIAGNOSIS — H5713 Ocular pain, bilateral: Secondary | ICD-10-CM | POA: Diagnosis not present

## 2016-04-10 DIAGNOSIS — E119 Type 2 diabetes mellitus without complications: Secondary | ICD-10-CM | POA: Diagnosis not present

## 2016-04-10 DIAGNOSIS — R51 Headache: Secondary | ICD-10-CM

## 2016-04-10 DIAGNOSIS — G43109 Migraine with aura, not intractable, without status migrainosus: Secondary | ICD-10-CM

## 2016-04-10 DIAGNOSIS — R29810 Facial weakness: Secondary | ICD-10-CM

## 2016-04-10 DIAGNOSIS — G43909 Migraine, unspecified, not intractable, without status migrainosus: Secondary | ICD-10-CM | POA: Diagnosis not present

## 2016-04-10 DIAGNOSIS — R519 Headache, unspecified: Secondary | ICD-10-CM

## 2016-04-10 DIAGNOSIS — H539 Unspecified visual disturbance: Secondary | ICD-10-CM

## 2016-04-10 HISTORY — DX: Type 2 diabetes mellitus without complications: E11.9

## 2016-04-10 HISTORY — DX: Pure hypercholesterolemia, unspecified: E78.00

## 2016-04-10 LAB — URINALYSIS COMPLETE WITH MICROSCOPIC (ARMC ONLY)
Bilirubin Urine: NEGATIVE
Glucose, UA: >500 mg/dL — AB
Hgb urine dipstick: NEGATIVE
Ketones, ur: NEGATIVE mg/dL
Nitrite: NEGATIVE
Protein, ur: NEGATIVE mg/dL
Specific Gravity, Urine: 1.013 (ref 1.005–1.030)
pH: 7 (ref 5.0–8.0)

## 2016-04-10 LAB — CBC
HCT: 44.8 % (ref 35.0–47.0)
Hemoglobin: 15 g/dL (ref 12.0–16.0)
MCH: 31.2 pg (ref 26.0–34.0)
MCHC: 33.5 g/dL (ref 32.0–36.0)
MCV: 92.9 fL (ref 80.0–100.0)
Platelets: 224 10*3/uL (ref 150–440)
RBC: 4.82 MIL/uL (ref 3.80–5.20)
RDW: 12.7 % (ref 11.5–14.5)
WBC: 7.7 10*3/uL (ref 3.6–11.0)

## 2016-04-10 LAB — BASIC METABOLIC PANEL
Anion gap: 4 — ABNORMAL LOW (ref 5–15)
BUN: 11 mg/dL (ref 6–20)
CO2: 29 mmol/L (ref 22–32)
Calcium: 9.6 mg/dL (ref 8.9–10.3)
Chloride: 105 mmol/L (ref 101–111)
Creatinine, Ser: 0.55 mg/dL (ref 0.44–1.00)
GFR calc Af Amer: >60 mL/min (ref >60)
GFR calc non Af Amer: >60 mL/min (ref >60)
Glucose, Bld: 258 mg/dL — ABNORMAL HIGH (ref 65–99)
Potassium: 4.1 mmol/L (ref 3.5–5.1)
Sodium: 138 mmol/L (ref 135–145)

## 2016-04-10 LAB — SEDIMENTATION RATE: Sed Rate: 8 mm/hr (ref 0–30)

## 2016-04-10 MED ORDER — DIPHENHYDRAMINE HCL 50 MG/ML IJ SOLN
12.5000 mg | Freq: Once | INTRAMUSCULAR | Status: AC
  Administered 2016-04-10: 12.5 mg via INTRAVENOUS
  Filled 2016-04-10: qty 1

## 2016-04-10 MED ORDER — GADOBENATE DIMEGLUMINE 529 MG/ML IV SOLN
15.0000 mL | Freq: Once | INTRAVENOUS | Status: AC | PRN
  Administered 2016-04-10: 15 mL via INTRAVENOUS
  Filled 2016-04-10: qty 15

## 2016-04-10 MED ORDER — METOCLOPRAMIDE HCL 5 MG/ML IJ SOLN
10.0000 mg | INTRAMUSCULAR | Status: AC
  Administered 2016-04-10: 10 mg via INTRAVENOUS
  Filled 2016-04-10 (×2): qty 2

## 2016-04-10 MED ORDER — SODIUM CHLORIDE 0.9 % IV BOLUS (SEPSIS)
500.0000 mL | INTRAVENOUS | Status: AC
Start: 2016-04-10 — End: 2016-04-10
  Administered 2016-04-10: 500 mL via INTRAVENOUS

## 2016-04-10 MED ORDER — KETOROLAC TROMETHAMINE 30 MG/ML IJ SOLN
15.0000 mg | Freq: Once | INTRAMUSCULAR | Status: AC
  Administered 2016-04-10: 15 mg via INTRAVENOUS
  Filled 2016-04-10: qty 1

## 2016-04-10 MED ORDER — DEXAMETHASONE SODIUM PHOSPHATE 10 MG/ML IJ SOLN
10.0000 mg | Freq: Once | INTRAMUSCULAR | Status: AC
  Administered 2016-04-10: 10 mg via INTRAVENOUS
  Filled 2016-04-10: qty 1

## 2016-04-10 NOTE — Discharge Instructions (Signed)
You had an extensive workup today including a CT scan of your head and an MRI of your brain, all of which was reassuring.  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  Please follow-up with your regular doctor at the next available opportunity and return to the emergency department if he develop new or worsening symptoms that concern you.

## 2016-04-10 NOTE — ED Provider Notes (Signed)
[facility] Emergency Department Provider Note  ____________________________________________   First MD Initiated Contact with Patient 04/10/16 1534     (approximate)  I have reviewed the triage vital signs and the nursing notes.   HISTORY  Chief Complaint Facial Pain  Hospital Spanish interpreter was utilized for the history and physical  HPI Tammy Brewer is a 60 y.o. female who presents for 3 days of constant headache, facial pain, eye pain.  She reports that it started relatively acutely and is radiating from the back of her head all the way around to the front and behind her eyes.  It was more noticeable on the left but it is also present on the right.  It is a sharp and stabbing pain.  She feels like it is affecting her vision somewhat as well.  Her eyes of the watering constantly, worse on the left.  She is able to sleep when she awakens the pain starts again immediately.  Nothing in particular makes it better or worse although she does feel sensitive to light.  Starting today she feels like she is having difficulty keeping liquid in her mouth on the left side of her face and her family noticed that she seems to have some facial droop on the left.  She also cannot completely close her eye on the left side.She has never had headaches like this in the past.  She is not having any difficulties with ambulation and is having no numbness or weakness in any of her extremities.   Past Medical History:  Diagnosis Date  . Diabetes mellitus without complication (HCC)   . High cholesterol     There are no active problems to display for this patient.   History reviewed. No pertinent surgical history.  Prior to Admission medications   Not on File    Allergies Review of patient's allergies indicates no known allergies.  History reviewed. No pertinent family history.  Social History Social History  Substance Use Topics  . Smoking status: Never Smoker  .  Smokeless tobacco: Never Used  . Alcohol use No    Review of Systems Constitutional: No fever/chills Eyes: No visual changes. ENT: No sore throat. Cardiovascular: Denies chest pain. Respiratory: Denies shortness of breath. Gastrointestinal: No abdominal pain.  No nausea, no vomiting.  No diarrhea.  No constipation. Genitourinary: Negative for dysuria. Musculoskeletal: Negative for back pain. Skin: Negative for rash. Neurological: Negative for headaches, focal weakness or numbness.  10-point ROS otherwise negative.  ____________________________________________   PHYSICAL EXAM:  VITAL SIGNS: ED Triage Vitals [04/10/16 1257]  Enc Vitals Group     BP 130/86     Pulse Rate 93     Resp 20     Temp 98 F (36.7 C)     Temp src      SpO2 98 %     Weight 170 lb (77.1 kg)     Height      Head Circumference      Peak Flow      Pain Score 3     Pain Loc      Pain Edu?      Excl. in GC?     Constitutional: Alert and oriented. Well appearing and in no acute distress Though she does appear uncomfortable. Eyes: Conjunctivae are severely injected bilaterally.  Pupils are medium sized and sluggish, minimally reactive to light.  No nystagmus.  She has what appears to be normal purposeful extraocular movement is able to follow  my finger without difficulty, but during my interview at times her eyes appear to move independently of one another.  I could not reproduce this on physical exam.  Her left eye is also tearing constantly. Head: Atraumatic. Nose: No congestion/rhinnorhea. Mouth/Throat: Mucous membranes are moist.  Oropharynx non-erythematous. Neck: No stridor.  No meningeal signs/neck stiffness. Cardiovascular: Normal rate, regular rhythm. Good peripheral circulation. Grossly normal heart sounds.  Respiratory: Normal respiratory effort.  No retractions. Lungs CTAB. Gastrointestinal: Soft and nontender. No distention.  Musculoskeletal: No lower extremity tenderness nor edema. No  gross deformities of extremities. Neurologic:  Normal speech and language.  When she smiles widely she has a mild droop on the left.  Mild left-sided ptosis. Skin:  Skin is warm, dry and intact. No rash noted. Psychiatric: Mood and affect are normal. Speech and behavior are normal.  ____________________________________________   LABS (all labs ordered are listed, but only abnormal results are displayed)  Labs Reviewed  BASIC METABOLIC PANEL - Abnormal; Notable for the following:       Result Value   Glucose, Bld 258 (*)    Anion gap 4 (*)    All other components within normal limits  URINALYSIS COMPLETEWITH MICROSCOPIC ([facility] ONLY) - Abnormal; Notable for the following:    Color, Urine YELLOW (*)    APPearance HAZY (*)    Glucose, UA >500 (*)    Leukocytes, UA 3+ (*)    Bacteria, UA RARE (*)    Squamous Epithelial / LPF 6-30 (*)    All other components within normal limits  CBC  SEDIMENTATION RATE  CBG MONITORING, ED   ____________________________________________  EKG  ED ECG REPORT I, Caoilainn Sacks, the attending physician, personally viewed and interpreted this ECG.  Date: 04/10/2016 EKG Time: 13:12 Rate: 90 Rhythm: normal sinus rhythm QRS Axis: normal Intervals: normal ST/T Wave abnormalities: normal Conduction Disturbances: none Narrative Interpretation: unremarkable  ____________________________________________  RADIOLOGY   Ct Head Wo Contrast  Result Date: 04/10/2016 CLINICAL DATA:  Severe headache for the past 2 days. Right-sided facial pain. EXAM: CT HEAD WITHOUT CONTRAST TECHNIQUE: Contiguous axial images were obtained from the base of the skull through the vertex without intravenous contrast. COMPARISON:  06/06/2007; 01/05/2007 FINDINGS: Brain: Similar findings of mild atrophy with sulcal prominence and prominence of the bifrontal extra-axial spaces. The gray-white differentiation is maintained. No CT evidence of acute large territory infarct. No  intraparenchymal or extra-axial mass or hemorrhage. Unchanged size and configuration of the ventricles and the basilar cisterns. No midline shift. Vascular: No hyperdense vessel or unexpected calcification. Skull: No displaced calvarial fracture. Sinuses/Orbits: Limited visualization of the paranasal sinuses and mastoid air cells is normal. No air-fluid levels. Other: Regional soft tissues appear normal. IMPRESSION: Mild atrophy without acute intracranial process. Electronically Signed   By: Simonne Come M.D.   On: 04/10/2016 13:43   Mr Laqueta Jean WU Contrast  Result Date: 04/10/2016 CLINICAL DATA:  Severe headache for 2 days. RIGHT-sided facial pain. Nausea and blurred vision. EXAM: MRI HEAD WITHOUT AND WITH CONTRAST TECHNIQUE: Multiplanar, multiecho pulse sequences of the brain and surrounding structures were obtained without and with intravenous contrast. CONTRAST:  15mL MULTIHANCE GADOBENATE DIMEGLUMINE 529 MG/ML IV SOLN COMPARISON:  CT head 04/10/2016. FINDINGS: No evidence for acute infarction, hemorrhage, mass lesion, hydrocephalus, or extra-axial fluid. Normal for age cerebral volume.  No white matter disease. Pituitary, pineal, and cerebellar tonsils unremarkable. No upper cervical lesions. Flow voids are maintained throughout the carotid, basilar, and vertebral arteries. There are no areas of  chronic hemorrhage. Post infusion, no abnormal enhancement of the brain or meninges. Visualized calvarium, skull base, and upper cervical osseous structures unremarkable. Scalp and extracranial soft tissues, orbits, sinuses, and mastoids show no acute process. IMPRESSION: Negative exam. Electronically Signed   By: Elsie Stain M.D.   On: 04/10/2016 17:31    ____________________________________________   PROCEDURES  Procedure(s) performed:   Procedures   Critical Care performed: No ____________________________________________   INITIAL IMPRESSION / ASSESSMENT AND PLAN / ED COURSE  Pertinent labs &  imaging results that were available during my care of the patient were reviewed by me and considered in my medical decision making (see chart for details).  The patient's signs and symptoms and physical exam findings are complex.  She may be suffering from Bell's palsy but that does not explain the degree of pain and the bilateral nature of the eye pain and conjunctival injection.  I am concerned about the possibility of a neuritis although she does not seem to have the loss of visual acuity one would expect from optic neuritis.  Given the broad differential and potential morbidity of her possible diagnoses, I am going to proceed with a MR brain with and without contrast.  I am also adding on a sedimentation rate.   Clinical Course  Value Comment By Time  MR Brain W Wo Contrast The patient's MR brain with and without contrast is completely within normal limits according to the radiologist report.  I will treat her for a migraine headache, and given the slight left-sided facial droop and slight ptosis, I will likely treat her for Bell's palsy as well. Loleta Rose, MD 08/18 1743   I checked on the patient after treating her with migraine medication.  Her headache is COMPLETELY gone.  She no longer has any pain in the back of her head as well as no pain in her eyes.  Her conjunctiva remain injected but she no longer is complaining of eye pain.  She no longer is having excessive lacrimation.  She is able to completely close her left eye and she has only very minimal facial asymmetry upon smiling.  Her clinical presentation is not consistent with Bell's palsy and with a completely reassuring MRI I think it is most probable she was having a complicated migraine.  I see no evidence of cerebral vascular compromise/dissection leading to a sympathetic response.  The patient feels very comfortable with going home and following up as an outpatient, and I had a lengthy discussion via the interpreter with her and her  family and encouraged them to return immediately if she develops new or worsening symptoms. Loleta Rose, MD 08/18 1912    ____________________________________________  FINAL CLINICAL IMPRESSION(S) / ED DIAGNOSES  Final diagnoses:  Facial droop  Visual changes  Eye pain, bilateral  Headache  Complicated migraine     MEDICATIONS GIVEN DURING THIS VISIT:  Medications  gadobenate dimeglumine (MULTIHANCE) injection 15 mL (15 mLs Intravenous Contrast Given 04/10/16 1657)  ketorolac (TORADOL) 30 MG/ML injection 15 mg (15 mg Intravenous Given 04/10/16 1802)  metoCLOPramide (REGLAN) injection 10 mg (10 mg Intravenous Given 04/10/16 1759)  diphenhydrAMINE (BENADRYL) injection 12.5 mg (12.5 mg Intravenous Given 04/10/16 1803)  sodium chloride 0.9 % bolus 500 mL (500 mLs Intravenous New Bag/Given 04/10/16 1756)  dexamethasone (DECADRON) injection 10 mg (10 mg Intravenous Given 04/10/16 1800)     NEW OUTPATIENT MEDICATIONS STARTED DURING THIS VISIT:  New Prescriptions   No medications on file      Note:  This document was prepared using Dragon voice recognition software and may include unintentional dictation errors.    Loleta Roseory Alechia Lezama, MD 04/10/16 [id number]

## 2016-04-10 NOTE — ED Triage Notes (Signed)
Pt to ed with c/o severe headache x 2 days.  Pt reports today she had right side facial pain.  Pt also reports nausea but denies vomiting, reports right eye blurred vision x 3 days.  Hx of diabetes.  States she tried tylenol OTC with mild relief. Denies unilateral weakness in extremities, but does state weakness on right side of face and neck.  Although no facial drooping noted at this time.

## 2016-05-13 DIAGNOSIS — Z8669 Personal history of other diseases of the nervous system and sense organs: Secondary | ICD-10-CM | POA: Insufficient documentation

## 2016-10-22 ENCOUNTER — Other Ambulatory Visit: Payer: Self-pay | Admitting: Obstetrics and Gynecology

## 2016-10-22 DIAGNOSIS — Z1231 Encounter for screening mammogram for malignant neoplasm of breast: Secondary | ICD-10-CM

## 2016-11-25 ENCOUNTER — Ambulatory Visit: Payer: Commercial Indemnity | Attending: Obstetrics and Gynecology

## 2017-10-14 ENCOUNTER — Other Ambulatory Visit: Payer: Self-pay | Admitting: Orthopedic Surgery

## 2017-10-14 DIAGNOSIS — M1711 Unilateral primary osteoarthritis, right knee: Secondary | ICD-10-CM

## 2017-10-21 ENCOUNTER — Other Ambulatory Visit: Payer: Self-pay | Admitting: Obstetrics and Gynecology

## 2017-10-21 DIAGNOSIS — Z1231 Encounter for screening mammogram for malignant neoplasm of breast: Secondary | ICD-10-CM

## 2017-11-02 ENCOUNTER — Ambulatory Visit: Payer: Commercial Indemnity

## 2018-02-18 ENCOUNTER — Ambulatory Visit
Admission: RE | Admit: 2018-02-18 | Discharge: 2018-02-18 | Disposition: A | Discharge status: Home / Self Care | Payer: Managed Care, Other (non HMO) | Source: Ambulatory Visit | Attending: Orthopedic Surgery | Admitting: Orthopedic Surgery

## 2018-02-18 DIAGNOSIS — M1711 Unilateral primary osteoarthritis, right knee: Secondary | ICD-10-CM | POA: Diagnosis present

## 2018-03-15 ENCOUNTER — Inpatient Hospital Stay: Admission: RE | Admit: 2018-03-15 | Payer: Self-pay | Source: Ambulatory Visit

## 2018-03-17 ENCOUNTER — Other Ambulatory Visit: Payer: Self-pay

## 2018-03-17 ENCOUNTER — Encounter
Admission: RE | Admit: 2018-03-17 | Discharge: 2018-03-17 | Disposition: A | Discharge status: Home / Self Care | Payer: Managed Care, Other (non HMO) | Source: Ambulatory Visit | Attending: Orthopedic Surgery | Admitting: Orthopedic Surgery

## 2018-03-17 DIAGNOSIS — M1711 Unilateral primary osteoarthritis, right knee: Secondary | ICD-10-CM | POA: Insufficient documentation

## 2018-03-17 DIAGNOSIS — Z01812 Encounter for preprocedural laboratory examination: Secondary | ICD-10-CM | POA: Diagnosis not present

## 2018-03-17 DIAGNOSIS — Z0183 Encounter for blood typing: Secondary | ICD-10-CM | POA: Diagnosis not present

## 2018-03-17 HISTORY — DX: Bell's palsy: G51.0

## 2018-03-17 HISTORY — DX: Anxiety disorder, unspecified: F41.9

## 2018-03-17 LAB — BASIC METABOLIC PANEL
Anion gap: 7 (ref 5–15)
BUN: 20 mg/dL (ref 8–23)
CO2: 28 mmol/L (ref 22–32)
Calcium: 9.6 mg/dL (ref 8.9–10.3)
Chloride: 106 mmol/L (ref 98–111)
Creatinine, Ser: 0.54 mg/dL (ref 0.44–1.00)
GFR calc Af Amer: >60 mL/min (ref >60)
GFR calc non Af Amer: >60 mL/min (ref >60)
Glucose, Bld: 125 mg/dL — ABNORMAL HIGH (ref 70–99)
Potassium: 4.5 mmol/L (ref 3.5–5.1)
Sodium: 141 mmol/L (ref 135–145)

## 2018-03-17 LAB — TYPE AND SCREEN
ABO/RH(D): O POS
Antibody Screen: NEGATIVE

## 2018-03-17 LAB — URINALYSIS, ROUTINE W REFLEX MICROSCOPIC
Bacteria, UA: NONE SEEN
Bilirubin Urine: NEGATIVE
Glucose, UA: >=500 mg/dL — AB
Hgb urine dipstick: NEGATIVE
Ketones, ur: NEGATIVE mg/dL
Leukocytes, UA: NEGATIVE
Nitrite: NEGATIVE
Protein, ur: NEGATIVE mg/dL
Specific Gravity, Urine: 1.025 (ref 1.005–1.030)
pH: 6 (ref 5.0–8.0)

## 2018-03-17 LAB — CBC
HCT: 45.5 % (ref 35.0–47.0)
Hemoglobin: 15.3 g/dL (ref 12.0–16.0)
MCH: 31.1 pg (ref 26.0–34.0)
MCHC: 33.5 g/dL (ref 32.0–36.0)
MCV: 92.7 fL (ref 80.0–100.0)
Platelets: 283 10*3/uL (ref 150–440)
RBC: 4.91 MIL/uL (ref 3.80–5.20)
RDW: 13.5 % (ref 11.5–14.5)
WBC: 7.6 10*3/uL (ref 3.6–11.0)

## 2018-03-17 LAB — PROTIME-INR
INR: 0.94
Prothrombin Time: 12.5 seconds (ref 11.4–15.2)

## 2018-03-17 LAB — SEDIMENTATION RATE: Sed Rate: 5 mm/h (ref 0–30)

## 2018-03-17 LAB — SURGICAL PCR SCREEN
MRSA, PCR: NEGATIVE
Staphylococcus aureus: NEGATIVE

## 2018-03-17 LAB — APTT: aPTT: 28 s (ref 24–36)

## 2018-03-17 NOTE — Patient Instructions (Signed)
Your procedure is scheduled on: Tuesday 03/22/18 Su procedimiento est programado para: [facility] Day Surgery Desk Presntese a: To find out your arrival time please call [phone] between 1PM - 3PM on Monday 03/21/18. Para saber su hora de llegada por favor llame al [phone] entre la 1PM - 3PM el da:  Remember: Instructions that are not followed completely may result in serious medical risk, up to and including death, or upon the discretion of your surgeon and anesthesiologist your surgery may need to be rescheduled.  Recuerde: Las instrucciones que no se siguen completamente Armed forces logistics/support/administrative officerpueden resultar en un riesgo de salud grave, incluyendo hasta la [place] o a discrecin de su cirujano y Scientific laboratory techniciananestesilogo, su ciruga se puede posponer.   __X__ 1. Do not eat food or drink liquids after midnight. No gum chewing or hard candies.  No coma alimentos ni tome lquidos despus de la medianoche.  No mastique chicle ni caramelos  duros.     __X__ 2. No alcohol for 24 hours before or after surgery.    No tome alcohol durante las 24 horas antes ni despus de la [place].   ____ 3. Bring all medications with you on the day of surgery if instructed.    Lleve todos los medicamentos con usted el da de su ciruga si se le ha indicado as.   __X__ 4. Notify your doctor if there is any change in your medical condition (cold, fever,                             infections).    Informe a su mdico si hay algn cambio en su condicin mdica (resfriado, fiebre, infecciones).   Do not wear jewelry, make-up, hairpins, clips or nail polish.  No use joyas, maquillajes, pinzas/ganchos para el cabello ni esmalte de uas.  Do not wear lotions, powders, or perfumes. .  No use lociones, polvos o perfumes.  .    Do not shave 48 hours prior to surgery. Men may shave face and neck.  No se afeite 48 horas antes de la [place].  Los hombres pueden Commercial Metals Companyafeitarse la cara y el cuello.   Do not bring  valuables to the hospital.   No lleve objetos de valor al hospital.  [facility] not [facility] for any belongings or valuables.  [place] no se hace responsable de ningn tipo de pertenencias u objetos de Licensed conveyancervalor.               Contacts, dentures or bridgework may not be worn into surgery.  Los lentes de [place], las dentaduras postizas o puentes no se pueden usar en la [place].  Leave your suitcase in the car. After surgery it may be brought to your room.  Deje su maleta en el auto.  Despus de la ciruga podr traerla a su habitacin.  For patients admitted to the hospital, discharge time is determined by your treatment team.  Para los pacientes que sean ingresados al hospital, el tiempo en el cual se le dar de alta es determinado por su                equipo de Forcetratamiento.   Patients discharged the day of surgery will not be allowed to drive home. A los pacientes que se les da de alta el mismo da de la ciruga no se les permitir conducir a Higher education careers advisercasa.   Please read over the following fact sheets  that you were given: Por favor lea las siguientes hojas de informacin que le dieron:   CHG INSTRUCTIONS   __X__ Take these medicines the morning of surgery with A SIP OF WATER:          [employer] estas medicinas la maana de la ciruga con UN SORBO DE AGUA:  1. NONE  2.   3.   4.       5.  6.  ____ Fleet Enema (as directed)          Enema de Fleet (segn lo indicado)    __X__ Use CHG Soap as directed          Utilice el jabn de CHG segn lo indicado  ____ Use inhalers on the day of surgery          Use los inhaladores el da de la ciruga  __X__ Stop metformin 2 days prior to surgery          Deje de tomar el metformin 2 das antes de la ciruga    ____ Take 1/2 of usual insulin dose the night before surgery and none on the morning of surgery           Tome la mitad de la dosis habitual de insulina la noche antes de la [place] y no tome nada en la maana de la              ciruga  ____ Stop Coumadin/Plavix/aspirin on           Deje de tomar el Coumadin/Plavix/aspirina el da:  __X__ Stop Anti-inflammatories on STARTING TODAY (ADVIL IBUPROFEN MOTRIN ASPIRIN ALEVE) TYLENOL IS OK          Deje de tomar antiinflamatorios el da:   ____ Stop supplements until after surgery            Deje de tomar suplementos hasta despus de la ciruga  ____ Bring C-Pap to the hospital          Lleve el C-Pap al hospital

## 2018-03-18 LAB — URINE CULTURE: Culture: <10000 — AB

## 2018-03-18 NOTE — Pre-Procedure Instructions (Signed)
UA results sent to Dr. Menz for review. 

## 2018-03-21 MED ORDER — CEFAZOLIN SODIUM-DEXTROSE 2-4 GM/100ML-% IV SOLN
2.0000 g | Freq: Once | INTRAVENOUS | Status: AC
  Administered 2018-03-22: 2 g via INTRAVENOUS

## 2018-03-22 ENCOUNTER — Encounter: Payer: Self-pay | Admitting: *Deleted

## 2018-03-22 ENCOUNTER — Inpatient Hospital Stay
Admission: RE | Admit: 2018-03-22 | Discharge: 2018-03-25 | DRG: 470 | Disposition: A | Discharge status: Home Health | Payer: Managed Care, Other (non HMO) | Attending: Orthopedic Surgery | Admitting: Orthopedic Surgery

## 2018-03-22 ENCOUNTER — Inpatient Hospital Stay: Payer: Managed Care, Other (non HMO) | Admitting: Anesthesiology

## 2018-03-22 ENCOUNTER — Other Ambulatory Visit: Payer: Self-pay

## 2018-03-22 ENCOUNTER — Inpatient Hospital Stay: Payer: Managed Care, Other (non HMO)

## 2018-03-22 ENCOUNTER — Encounter
Admission: RE | Disposition: A | Discharge status: Home Health | Payer: Self-pay | Source: Home / Self Care | Attending: Orthopedic Surgery

## 2018-03-22 DIAGNOSIS — E876 Hypokalemia: Secondary | ICD-10-CM | POA: Diagnosis present

## 2018-03-22 DIAGNOSIS — J90 Pleural effusion, not elsewhere classified: Secondary | ICD-10-CM | POA: Diagnosis present

## 2018-03-22 DIAGNOSIS — Z8249 Family history of ischemic heart disease and other diseases of the circulatory system: Secondary | ICD-10-CM | POA: Diagnosis not present

## 2018-03-22 DIAGNOSIS — Z7984 Long term (current) use of oral hypoglycemic drugs: Secondary | ICD-10-CM

## 2018-03-22 DIAGNOSIS — E119 Type 2 diabetes mellitus without complications: Secondary | ICD-10-CM | POA: Diagnosis present

## 2018-03-22 DIAGNOSIS — G8918 Other acute postprocedural pain: Secondary | ICD-10-CM

## 2018-03-22 DIAGNOSIS — R509 Fever, unspecified: Secondary | ICD-10-CM

## 2018-03-22 DIAGNOSIS — G510 Bell's palsy: Secondary | ICD-10-CM | POA: Diagnosis present

## 2018-03-22 DIAGNOSIS — E7800 Pure hypercholesterolemia, unspecified: Secondary | ICD-10-CM | POA: Diagnosis present

## 2018-03-22 DIAGNOSIS — M1711 Unilateral primary osteoarthritis, right knee: Principal | ICD-10-CM | POA: Diagnosis present

## 2018-03-22 DIAGNOSIS — Z96651 Presence of right artificial knee joint: Secondary | ICD-10-CM

## 2018-03-22 HISTORY — PX: TOTAL KNEE ARTHROPLASTY: SHX125

## 2018-03-22 LAB — ABO/RH: ABO/RH(D): O POS

## 2018-03-22 LAB — GLUCOSE, CAPILLARY
Glucose-Capillary: 142 mg/dL — ABNORMAL HIGH (ref 70–99)
Glucose-Capillary: 140 mg/dL — ABNORMAL HIGH (ref 70–99)
Glucose-Capillary: 264 mg/dL — ABNORMAL HIGH (ref 70–99)

## 2018-03-22 SURGERY — ARTHROPLASTY, KNEE, TOTAL
Anesthesia: Spinal | Site: Knee | Laterality: Right | Wound class: Clean

## 2018-03-22 MED ORDER — SODIUM CHLORIDE 0.9 % IV SOLN
INTRAVENOUS | Status: DC
  Administered 2018-03-22 (×2): via INTRAVENOUS

## 2018-03-22 MED ORDER — MORPHINE SULFATE (PF) 10 MG/ML IV SOLN
INTRAVENOUS | Status: AC
  Filled 2018-03-22: qty 1

## 2018-03-22 MED ORDER — CEFAZOLIN SODIUM-DEXTROSE 1-4 GM/50ML-% IV SOLN
1.0000 g | Freq: Four times a day (QID) | INTRAVENOUS | Status: AC
  Administered 2018-03-22 – 2018-03-23 (×3): 1 g via INTRAVENOUS
  Filled 2018-03-22 (×3): qty 50

## 2018-03-22 MED ORDER — METOCLOPRAMIDE HCL 5 MG/ML IJ SOLN: 5.0000 mg | Freq: Three times a day (TID) | INTRAMUSCULAR | Status: DC | PRN

## 2018-03-22 MED ORDER — ONDANSETRON HCL 4 MG PO TABS
4.0000 mg | ORAL_TABLET | Freq: Four times a day (QID) | ORAL | Status: DC | PRN
  Administered 2018-03-25: 4 mg via ORAL
  Filled 2018-03-22: qty 1

## 2018-03-22 MED ORDER — OXYCODONE HCL 5 MG/5ML PO SOLN: 5.0000 mg | Freq: Once | ORAL | Status: DC | PRN

## 2018-03-22 MED ORDER — BUPIVACAINE-EPINEPHRINE (PF) 0.25% -1:200000 IJ SOLN
INTRAMUSCULAR | Status: DC | PRN
  Administered 2018-03-22: 30 mL

## 2018-03-22 MED ORDER — PROPOFOL 10 MG/ML IV BOLUS
INTRAVENOUS | Status: AC
  Filled 2018-03-22: qty 20

## 2018-03-22 MED ORDER — SODIUM CHLORIDE 0.9 % IJ SOLN
INTRAMUSCULAR | Status: AC
  Filled 2018-03-22: qty 100

## 2018-03-22 MED ORDER — ALUM & MAG HYDROXIDE-SIMETH 200-200-20 MG/5ML PO SUSP
30.0000 mL | ORAL | Status: DC | PRN
  Filled 2018-03-22: qty 30

## 2018-03-22 MED ORDER — GABAPENTIN 300 MG PO CAPS
300.0000 mg | ORAL_CAPSULE | Freq: Three times a day (TID) | ORAL | Status: DC
  Administered 2018-03-22 – 2018-03-25 (×9): 300 mg via ORAL
  Filled 2018-03-22 (×9): qty 1

## 2018-03-22 MED ORDER — METHOCARBAMOL 1000 MG/10ML IJ SOLN
500.0000 mg | Freq: Four times a day (QID) | INTRAVENOUS | Status: DC | PRN
  Filled 2018-03-22: qty 5

## 2018-03-22 MED ORDER — PROPOFOL 10 MG/ML IV BOLUS
INTRAVENOUS | Status: AC
Start: 2018-03-22 — End: ?
  Filled 2018-03-22: qty 20

## 2018-03-22 MED ORDER — ASPIRIN EC 325 MG PO TBEC
325.0000 mg | DELAYED_RELEASE_TABLET | Freq: Every day | ORAL | Status: DC
  Administered 2018-03-23 – 2018-03-25 (×3): 325 mg via ORAL
  Filled 2018-03-22 (×3): qty 1

## 2018-03-22 MED ORDER — METFORMIN HCL ER 500 MG PO TB24
500.0000 mg | ORAL_TABLET | Freq: Two times a day (BID) | ORAL | Status: DC
  Administered 2018-03-22 – 2018-03-24 (×5): 500 mg via ORAL
  Filled 2018-03-22 (×7): qty 1

## 2018-03-22 MED ORDER — OXYCODONE HCL 5 MG PO TABS: 5.0000 mg | ORAL_TABLET | Freq: Once | ORAL | Status: DC | PRN

## 2018-03-22 MED ORDER — FAMOTIDINE 20 MG PO TABS
20.0000 mg | ORAL_TABLET | Freq: Once | ORAL | Status: AC
  Administered 2018-03-22: 20 mg via ORAL

## 2018-03-22 MED ORDER — PROPOFOL 10 MG/ML IV BOLUS
INTRAVENOUS | Status: DC | PRN
  Administered 2018-03-22: 40 mg via INTRAVENOUS

## 2018-03-22 MED ORDER — TRANEXAMIC ACID 1000 MG/10ML IV SOLN
INTRAVENOUS | Status: AC
  Filled 2018-03-22: qty 10

## 2018-03-22 MED ORDER — INSULIN ASPART 100 UNIT/ML ~~LOC~~ SOLN
0.0000 [IU] | Freq: Three times a day (TID) | SUBCUTANEOUS | Status: DC
  Administered 2018-03-23 (×2): 3 [IU] via SUBCUTANEOUS
  Administered 2018-03-23: 2 [IU] via SUBCUTANEOUS
  Administered 2018-03-24 (×2): 3 [IU] via SUBCUTANEOUS
  Administered 2018-03-24: 2 [IU] via SUBCUTANEOUS
  Administered 2018-03-25 (×2): 3 [IU] via SUBCUTANEOUS
  Filled 2018-03-22 (×8): qty 1

## 2018-03-22 MED ORDER — FENTANYL CITRATE (PF) 100 MCG/2ML IJ SOLN: 25.0000 ug | INTRAMUSCULAR | Status: DC | PRN

## 2018-03-22 MED ORDER — PROPOFOL 500 MG/50ML IV EMUL
INTRAVENOUS | Status: DC | PRN
  Administered 2018-03-22: 75 ug/kg/min via INTRAVENOUS

## 2018-03-22 MED ORDER — BUPIVACAINE LIPOSOME 1.3 % IJ SUSP
INTRAMUSCULAR | Status: AC
  Filled 2018-03-22: qty 20

## 2018-03-22 MED ORDER — MAGNESIUM CITRATE PO SOLN
1.0000 | Freq: Once | ORAL | Status: AC | PRN
  Administered 2018-03-25: 1 via ORAL
  Filled 2018-03-22 (×2): qty 296

## 2018-03-22 MED ORDER — SODIUM CHLORIDE 0.9 % IV SOLN
INTRAVENOUS | Status: DC | PRN
  Administered 2018-03-22: 20 ug/min via INTRAVENOUS

## 2018-03-22 MED ORDER — SODIUM CHLORIDE 0.9 % IV SOLN
INTRAVENOUS | Status: DC
  Administered 2018-03-22: 18:00:00 via INTRAVENOUS

## 2018-03-22 MED ORDER — MIDAZOLAM HCL 5 MG/5ML IJ SOLN
INTRAMUSCULAR | Status: DC | PRN
  Administered 2018-03-22 (×2): 1 mg via INTRAVENOUS

## 2018-03-22 MED ORDER — MENTHOL 3 MG MT LOZG
1.0000 | LOZENGE | OROMUCOSAL | Status: DC | PRN
  Filled 2018-03-22: qty 9

## 2018-03-22 MED ORDER — ZOLPIDEM TARTRATE 5 MG PO TABS
5.0000 mg | ORAL_TABLET | Freq: Every evening | ORAL | Status: DC | PRN
  Administered 2018-03-23 – 2018-03-24 (×2): 5 mg via ORAL
  Filled 2018-03-22 (×2): qty 1

## 2018-03-22 MED ORDER — MORPHINE SULFATE (PF) 2 MG/ML IV SOLN: 0.5000 mg | INTRAVENOUS | Status: DC | PRN

## 2018-03-22 MED ORDER — SENNOSIDES-DOCUSATE SODIUM 8.6-50 MG PO TABS
1.0000 | ORAL_TABLET | Freq: Every evening | ORAL | Status: DC | PRN
  Administered 2018-03-24: 1 via ORAL
  Filled 2018-03-22: qty 1

## 2018-03-22 MED ORDER — METHOCARBAMOL 500 MG PO TABS
500.0000 mg | ORAL_TABLET | Freq: Four times a day (QID) | ORAL | Status: DC | PRN
  Administered 2018-03-23 – 2018-03-25 (×2): 500 mg via ORAL
  Filled 2018-03-22 (×2): qty 1

## 2018-03-22 MED ORDER — CANAGLIFLOZIN 100 MG PO TABS
100.0000 mg | ORAL_TABLET | Freq: Every day | ORAL | Status: DC
  Filled 2018-03-22 (×3): qty 1

## 2018-03-22 MED ORDER — HYDROCODONE-ACETAMINOPHEN 7.5-325 MG PO TABS
1.0000 | ORAL_TABLET | ORAL | Status: DC | PRN
  Administered 2018-03-22 – 2018-03-24 (×3): 1 via ORAL
  Filled 2018-03-22 (×3): qty 1

## 2018-03-22 MED ORDER — ACETAMINOPHEN 500 MG PO TABS
500.0000 mg | ORAL_TABLET | Freq: Four times a day (QID) | ORAL | Status: AC
  Administered 2018-03-22 – 2018-03-23 (×3): 500 mg via ORAL
  Filled 2018-03-22 (×3): qty 1

## 2018-03-22 MED ORDER — DOCUSATE SODIUM 100 MG PO CAPS
100.0000 mg | ORAL_CAPSULE | Freq: Two times a day (BID) | ORAL | Status: DC
  Administered 2018-03-22 – 2018-03-25 (×6): 100 mg via ORAL
  Filled 2018-03-22 (×6): qty 1

## 2018-03-22 MED ORDER — BISACODYL 5 MG PO TBEC: 5.0000 mg | DELAYED_RELEASE_TABLET | Freq: Every day | ORAL | Status: DC | PRN

## 2018-03-22 MED ORDER — METOCLOPRAMIDE HCL 10 MG PO TABS: 5.0000 mg | ORAL_TABLET | Freq: Three times a day (TID) | ORAL | Status: DC | PRN

## 2018-03-22 MED ORDER — TRANEXAMIC ACID 1000 MG/10ML IV SOLN
INTRAVENOUS | Status: DC | PRN
  Administered 2018-03-22: 1000 mg via INTRAVENOUS

## 2018-03-22 MED ORDER — SODIUM CHLORIDE 0.9 % IV SOLN
INTRAVENOUS | Status: DC | PRN
  Administered 2018-03-22: 60 mL

## 2018-03-22 MED ORDER — DIPHENHYDRAMINE HCL 12.5 MG/5ML PO ELIX: 12.5000 mg | ORAL_SOLUTION | ORAL | Status: DC | PRN

## 2018-03-22 MED ORDER — NEOMYCIN-POLYMYXIN B GU 40-200000 IR SOLN
Status: DC | PRN
  Administered 2018-03-22: 14 mL

## 2018-03-22 MED ORDER — ACETAMINOPHEN 325 MG PO TABS
325.0000 mg | ORAL_TABLET | Freq: Four times a day (QID) | ORAL | Status: DC | PRN
  Administered 2018-03-23: 650 mg via ORAL
  Filled 2018-03-22: qty 2

## 2018-03-22 MED ORDER — HYDROCODONE-ACETAMINOPHEN 5-325 MG PO TABS
1.0000 | ORAL_TABLET | ORAL | Status: DC | PRN
  Administered 2018-03-23 (×2): 1 via ORAL
  Administered 2018-03-24 – 2018-03-25 (×4): 2 via ORAL
  Filled 2018-03-22 (×2): qty 1
  Filled 2018-03-22 (×5): qty 2

## 2018-03-22 MED ORDER — FAMOTIDINE 20 MG PO TABS
ORAL_TABLET | ORAL | Status: AC
Start: 2018-03-22 — End: 2018-03-22
  Administered 2018-03-22: 20 mg via ORAL
  Filled 2018-03-22: qty 1

## 2018-03-22 MED ORDER — BUPIVACAINE-EPINEPHRINE (PF) 0.25% -1:200000 IJ SOLN
INTRAMUSCULAR | Status: AC
  Filled 2018-03-22: qty 30

## 2018-03-22 MED ORDER — TRAMADOL HCL 50 MG PO TABS
50.0000 mg | ORAL_TABLET | Freq: Four times a day (QID) | ORAL | Status: DC
  Administered 2018-03-22 – 2018-03-25 (×9): 50 mg via ORAL
  Filled 2018-03-22 (×10): qty 1

## 2018-03-22 MED ORDER — GLIMEPIRIDE 4 MG PO TABS
4.0000 mg | ORAL_TABLET | Freq: Every day | ORAL | Status: DC
  Filled 2018-03-22 (×3): qty 1

## 2018-03-22 MED ORDER — ONDANSETRON HCL 4 MG/2ML IJ SOLN: 4.0000 mg | Freq: Four times a day (QID) | INTRAMUSCULAR | Status: DC | PRN

## 2018-03-22 MED ORDER — BUPIVACAINE HCL (PF) 0.5 % IJ SOLN
INTRAMUSCULAR | Status: DC | PRN
  Administered 2018-03-22: 3 mL

## 2018-03-22 MED ORDER — MIDAZOLAM HCL 2 MG/2ML IJ SOLN
INTRAMUSCULAR | Status: AC
  Filled 2018-03-22: qty 2

## 2018-03-22 MED ORDER — NEOMYCIN-POLYMYXIN B GU 40-200000 IR SOLN
Status: AC
  Filled 2018-03-22: qty 20

## 2018-03-22 MED ORDER — CEFAZOLIN SODIUM-DEXTROSE 2-4 GM/100ML-% IV SOLN
INTRAVENOUS | Status: AC
  Filled 2018-03-22: qty 100

## 2018-03-22 MED ORDER — MORPHINE SULFATE 10 MG/ML IJ SOLN
INTRAMUSCULAR | Status: DC | PRN
  Administered 2018-03-22: 10 mg

## 2018-03-22 MED ORDER — PHENOL 1.4 % MT LIQD
1.0000 | OROMUCOSAL | Status: DC | PRN
  Filled 2018-03-22: qty 177

## 2018-03-22 SURGICAL SUPPLY — 64 items
BANDAGE ACE 6X5 VEL STRL LF (GAUZE/BANDAGES/DRESSINGS) ×2 IMPLANT
BLADE SAW 1 (BLADE) ×2 IMPLANT
BLOCK CUTTING TIBIAL 2 RT (MISCELLANEOUS) ×2 IMPLANT
BLOCK CUTTING TIBIAL 4 RT MIS (MISCELLANEOUS) ×2 IMPLANT
BLOCK FEMUR CUTTING 2P RIGHT (MISCELLANEOUS) ×2 IMPLANT
CANISTER SUCT 1200ML W/VALVE (MISCELLANEOUS) ×2 IMPLANT
CANISTER SUCT 3000ML PPV (MISCELLANEOUS) ×4 IMPLANT
CEMENT HV SMART SET (Cement) ×4 IMPLANT
CEMENT PATELLA RESURF SZ1 (Cement) ×2 IMPLANT
CHLORAPREP W/TINT 26ML (MISCELLANEOUS) ×4 IMPLANT
COOLER POLAR GLACIER W/PUMP (MISCELLANEOUS) ×2 IMPLANT
CUFF TOURN 24 STER (MISCELLANEOUS) IMPLANT
CUFF TOURN 30 STER DUAL PORT (MISCELLANEOUS) IMPLANT
DRAPE SHEET LG 3/4 BI-LAMINATE (DRAPES) ×4 IMPLANT
ELECT CAUTERY BLADE 6.4 (BLADE) ×2 IMPLANT
ELECT REM PT RETURN 9FT ADLT (ELECTROSURGICAL) ×2
ELECTRODE REM PT RTRN 9FT ADLT (ELECTROSURGICAL) ×1 IMPLANT
FEMORAL COMPONENT RIGHT SZ2P (Femur) ×2 IMPLANT
FEMUR BONE MODEL (MISCELLANEOUS) ×2 IMPLANT
GAUZE PETRO XEROFOAM 1X8 (MISCELLANEOUS) ×2 IMPLANT
GAUZE SPONGE 4X4 12PLY STRL (GAUZE/BANDAGES/DRESSINGS) ×2 IMPLANT
GLOVE BIOGEL PI IND STRL 9 (GLOVE) ×1 IMPLANT
GLOVE BIOGEL PI INDICATOR 9 (GLOVE) ×1
GLOVE INDICATOR 8.0 STRL GRN (GLOVE) ×2 IMPLANT
GLOVE SURG ORTHO 8.0 STRL STRW (GLOVE) ×2 IMPLANT
GLOVE SURG SYN 9.0  PF PI (GLOVE) ×1
GLOVE SURG SYN 9.0 PF PI (GLOVE) ×1 IMPLANT
GOWN SRG 2XL LVL 4 RGLN SLV (GOWNS) ×1 IMPLANT
GOWN STRL NON-REIN 2XL LVL4 (GOWNS) ×1
GOWN STRL REUS W/ TWL LRG LVL3 (GOWN DISPOSABLE) ×1 IMPLANT
GOWN STRL REUS W/ TWL XL LVL3 (GOWN DISPOSABLE) ×1 IMPLANT
GOWN STRL REUS W/TWL LRG LVL3 (GOWN DISPOSABLE) ×1
GOWN STRL REUS W/TWL XL LVL3 (GOWN DISPOSABLE) ×1
HOLDER FOLEY CATH W/STRAP (MISCELLANEOUS) ×2 IMPLANT
HOOD PEEL AWAY FLYTE STAYCOOL (MISCELLANEOUS) ×4 IMPLANT
INSERT TIBIAL FIXED SZ2 R 14MM (Insert) ×2 IMPLANT
KIT TURNOVER KIT A (KITS) ×2 IMPLANT
KNIFE SCULPS 14X20 (INSTRUMENTS) ×2 IMPLANT
NDL SAFETY ECLIPSE 18X1.5 (NEEDLE) ×1 IMPLANT
NEEDLE HYPO 18GX1.5 SHARP (NEEDLE) ×1
NEEDLE SPNL 18GX3.5 QUINCKE PK (NEEDLE) ×2 IMPLANT
NEEDLE SPNL 20GX3.5 QUINCKE YW (NEEDLE) ×2 IMPLANT
NS IRRIG 1000ML POUR BTL (IV SOLUTION) ×2 IMPLANT
PACK TOTAL KNEE (MISCELLANEOUS) ×2 IMPLANT
PAD WRAPON POLAR KNEE (MISCELLANEOUS) ×1 IMPLANT
PULSAVAC PLUS IRRIG FAN TIP (DISPOSABLE) ×2
SCALPEL PROTECTED #10 DISP (BLADE) ×4 IMPLANT
SOL .9 NS 3000ML IRR  AL (IV SOLUTION) ×1
SOL .9 NS 3000ML IRR UROMATIC (IV SOLUTION) ×1 IMPLANT
STAPLER SKIN PROX 35W (STAPLE) ×2 IMPLANT
STEM EXTENSION 11MMX30MM (Stem) ×2 IMPLANT
SUCTION FRAZIER HANDLE 10FR (MISCELLANEOUS) ×1
SUCTION TUBE FRAZIER 10FR DISP (MISCELLANEOUS) ×1 IMPLANT
SUT DVC 2 QUILL PDO  T11 36X36 (SUTURE) ×1
SUT DVC 2 QUILL PDO T11 36X36 (SUTURE) ×1 IMPLANT
SUT V-LOC 90 ABS DVC 3-0 CL (SUTURE) ×2 IMPLANT
SYR 20CC LL (SYRINGE) ×2 IMPLANT
SYR 50ML LL SCALE MARK (SYRINGE) ×4 IMPLANT
TIB TRAY SZ 2 R FIXED (Joint) ×2 IMPLANT
TIP FAN IRRIG PULSAVAC PLUS (DISPOSABLE) ×1 IMPLANT
TOWEL OR 17X26 4PK STRL BLUE (TOWEL DISPOSABLE) ×2 IMPLANT
TOWER CARTRIDGE SMART MIX (DISPOSABLE) ×2 IMPLANT
TRAY FOLEY MTR SLVR 16FR STAT (SET/KITS/TRAYS/PACK) ×2 IMPLANT
WRAPON POLAR PAD KNEE (MISCELLANEOUS) ×2

## 2018-03-22 NOTE — Progress Notes (Signed)
PT Cancellation Note  Patient Details Name: Dot BeenMaria T Fargo MRN: [id number] DOB: 30-Mar-1956   Cancelled Treatment:    Reason Eval/Treat Not Completed: Patient not medically ready Pt just getting up to room ~15:15, R LE still numb not appropriate for PT exam at this time.  Will initiate care tomorrow.  Malachi ProGalen R Lanson Randle, DPT 03/22/2018, 3:40 PM

## 2018-03-22 NOTE — H&P (Signed)
Reviewed paper H+P, will be scanned into chart. No changes noted.  

## 2018-03-22 NOTE — Anesthesia Procedure Notes (Signed)
Spinal  Patient location during procedure: OR Start time: 03/22/2018 11:40 AM End time: 03/22/2018 11:45 AM Staffing Resident/CRNA: Nelda Marseille, CRNA Performed: resident/CRNA  Preanesthetic Checklist Completed: patient identified, site marked, surgical consent, pre-op evaluation, timeout performed, IV checked, risks and benefits discussed and monitors and equipment checked Spinal Block Patient position: sitting Prep: Betadine Patient monitoring: heart rate, continuous pulse ox, blood pressure and cardiac monitor Approach: midline Location: L3-4 Injection technique: single-shot Needle Needle type: Whitacre and Introducer  Needle gauge: 25 G Needle length: 9 cm Assessment Sensory level: T10 Additional Notes Negative paresthesia. Negative blood return. Positive free-flowing CSF. Expiration date of kit checked and confirmed. Patient tolerated procedure well, without complications.

## 2018-03-22 NOTE — Anesthesia Procedure Notes (Signed)
Date/Time: 03/22/2018 12:05 PM Performed by: Junious SilkNoles, Adrion Menz, CRNA Pre-anesthesia Checklist: Patient identified, Emergency Drugs available, Suction available, Patient being monitored and Timeout performed Oxygen Delivery Method: Simple face mask

## 2018-03-22 NOTE — Progress Notes (Signed)
Patient admitted to room 135 from surgery. A&O x4. Spanish speaking only. Family at bedside, they speak [place]. Foley draining clear yellow urine. Polar care in place. Ace wrap to right leg. IV fluids started. C/O chest pain on admit. VSS. Gave dose of Maalox, with some relief.

## 2018-03-22 NOTE — Anesthesia Preprocedure Evaluation (Addendum)
Anesthesia Evaluation  Patient identified by MRN, date of birth, ID band Patient awake    Reviewed: Allergy & Precautions, H&P , NPO status , Patient's Chart, lab work & pertinent test results  History of Anesthesia Complications Negative for: history of anesthetic complications  Airway Mallampati: III  TM Distance: <3 FB Neck ROM: limited    Dental  (+) Chipped, Poor Dentition, Missing, Partial Upper   Pulmonary neg pulmonary ROS, neg shortness of breath,           Cardiovascular Exercise Tolerance: Good (-) angina(-) Past MI and (-) DOE negative cardio ROS       Neuro/Psych Anxiety  Neuromuscular disease negative psych ROS   GI/Hepatic negative GI ROS, Neg liver ROS,   Endo/Other  diabetes, Type 2  Renal/GU      Musculoskeletal   Abdominal   Peds  Hematology negative hematology ROS (+)   Anesthesia Other Findings Past Medical History: No date: Anxiety No date: Bell's palsy No date: Diabetes mellitus without complication (HCC) No date: High cholesterol  Past Surgical History: No date: APPENDECTOMY No date: CHOLECYSTECTOMY No date: DILATION AND CURETTAGE OF UTERUS  BMI    Body Mass Index:  26.95 kg/m      Reproductive/Obstetrics negative OB ROS                             Anesthesia Physical  Anesthesia Plan  ASA: III  Anesthesia Plan: Spinal   Post-op Pain Management:    Induction:   PONV Risk Score and Plan:   Airway Management Planned: Natural Airway and Nasal Cannula  Additional Equipment:   Intra-op Plan:   Post-operative Plan:   Informed Consent: I have reviewed the patients History and Physical, chart, labs and discussed the procedure including the risks, benefits and alternatives for the proposed anesthesia with the patient or authorized representative who has indicated his/her understanding and acceptance.   Dental Advisory Given  Plan Discussed  with: Anesthesiologist, CRNA and Surgeon  Anesthesia Plan Comments: (Consent via interpreter   Patient reports no bleeding problems and no anticoagulant use.  Plan for spinal with backup GA  Patient consented for risks of anesthesia including but not limited to:  - adverse reactions to medications - risk of bleeding, infection, nerve damage and headache - risk of failed spinal - damage to teeth, lips or other oral mucosa - sore throat or hoarseness - Damage to heart, brain, lungs or loss of life  Patient voiced understanding.)        Anesthesia Quick Evaluation  

## 2018-03-22 NOTE — Transfer of Care (Signed)
Immediate Anesthesia Transfer of Care Note  Patient: Tammy Brewer  Procedure(s) Performed: TOTAL KNEE ARTHROPLASTY (Right Knee)  Patient Location: PACU  Anesthesia Type:Spinal  Level of Consciousness: awake and sedated  Airway & Oxygen Therapy: Patient Spontanous Breathing and Patient connected to face mask oxygen  Post-op Assessment: Report given to RN and Post -op Vital signs reviewed and stable  Post vital signs: Reviewed and stable  Last Vitals:  Vitals Value Taken Time  BP    Temp    Pulse 83 03/22/2018  1:40 PM  Resp 19 03/22/2018  1:40 PM  SpO2 98 % 03/22/2018  1:40 PM  Vitals shown include unvalidated device data.  Last Pain:  Vitals:   03/22/18 1027  TempSrc: Temporal  PainSc: 5          Complications: No apparent anesthesia complications

## 2018-03-22 NOTE — Anesthesia Post-op Follow-up Note (Signed)
Anesthesia QCDR form completed.        

## 2018-03-22 NOTE — [place] FL2 (Signed)
[place] MEDICAID FL2 LEVEL OF CARE SCREENING TOOL     IDENTIFICATION  Patient Name: Tammy Brewer Birthdate: [birth year] Sex: female Admission Date (Current Location): 03/22/2018  [place] and [place] Number:  Chiropodist and Address:  [facility], [address] [place], [place] [address]      Provider Number: [id number]  Attending Physician Name and Address:  Kennedy Bucker, MD  Relative Name and Phone Number:  Karole Oo- son [phone]    Current Level of Care: Hospital Recommended Level of Care: Skilled Nursing Facility Prior Approval Number:    Date Approved/Denied:   PASRR Number: [id number]  Discharge Plan: SNF    Current Diagnoses: Patient Active Problem List   Diagnosis Date Noted  . Status post total knee replacement using cement, right 03/22/2018    Orientation RESPIRATION BLADDER Height & Weight     Self, Time, Situation, Place  Normal Continent Weight: 158 lb 8 oz (71.9 kg) Height:  5\' 4"  (162.6 cm)  BEHAVIORAL SYMPTOMS/MOOD NEUROLOGICAL BOWEL NUTRITION STATUS  (none) (None) Continent Diet(Carb modified )  AMBULATORY STATUS COMMUNICATION OF NEEDS Skin   Extensive Assist Verbally Surgical wounds(Incision on Right Knee )                       Personal Care Assistance Level of Assistance  Bathing, Feeding, Dressing Bathing Assistance: Limited assistance Feeding assistance: Independent Dressing Assistance: Limited assistance     Functional Limitations Info  Sight, Hearing, Speech Sight Info: Adequate Hearing Info: Adequate Speech Info: Adequate    SPECIAL CARE FACTORS FREQUENCY  PT (By licensed PT), OT (By licensed OT)     PT Frequency: 5 OT Frequency: 5            Contractures Contractures Info: Not present    Additional Factors Info  Code Status, Allergies Code Status Info: Full Code  Allergies Info: NKA           Current Medications (03/22/2018):  This is the current hospital  active medication list Current Facility-Administered Medications  Medication Dose Route Frequency Provider Last Rate Last Dose  . 0.9 %  sodium chloride infusion   Intravenous Continuous Kennedy Bucker, MD      . Melene Muller ON 03/23/2018] acetaminophen (TYLENOL) tablet 325-650 mg  325-650 mg Oral Q6H PRN Kennedy Bucker, MD      . acetaminophen (TYLENOL) tablet 500 mg  500 mg Oral Q6H Kennedy Bucker, MD      . alum & mag hydroxide-simeth (MAALOX/MYLANTA) 200-200-20 MG/5ML suspension 30 mL  30 mL Oral Q4H PRN Kennedy Bucker, MD      . Melene Muller ON 03/23/2018] aspirin EC tablet 325 mg  325 mg Oral Q breakfast Kennedy Bucker, MD      . bisacodyl (DULCOLAX) EC tablet 5 mg  5 mg Oral Daily PRN Kennedy Bucker, MD      . Melene Muller ON 03/23/2018] canagliflozin [facility]) tablet 100 mg  100 mg Oral QAC breakfast Kennedy Bucker, MD      . ceFAZolin (ANCEF) IVPB 1 g/50 mL premix  1 g Intravenous Q6H Kennedy Bucker, MD      . diphenhydrAMINE (BENADRYL) 12.5 MG/5ML elixir 12.5-25 mg  12.5-25 mg Oral Q4H PRN Kennedy Bucker, MD      . docusate sodium (COLACE) capsule 100 mg  100 mg Oral BID Kennedy Bucker, MD      . gabapentin (NEURONTIN) capsule 300 mg  300 mg Oral TID Kennedy Bucker, MD      . [  START ON 03/23/2018] glimepiride (AMARYL) tablet 4 mg  4 mg Oral Q breakfast Kennedy BuckerMenz, Michael, MD      . HYDROcodone-acetaminophen [facility](NORCO) 7.5-325 MG per tablet 1-2 tablet  1-2 tablet Oral Q4H PRN Kennedy BuckerMenz, Michael, MD      . HYDROcodone-acetaminophen (NORCO/VICODIN) 5-325 MG per tablet 1-2 tablet  1-2 tablet Oral Q4H PRN Kennedy BuckerMenz, Michael, MD      . insulin aspart (novoLOG) injection 0-15 Units  0-15 Units Subcutaneous TID WC Kennedy BuckerMenz, Michael, MD      . magnesium citrate solution 1 Bottle  1 Bottle Oral Once PRN Kennedy BuckerMenz, Michael, MD      . menthol-cetylpyridinium (CEPACOL) lozenge 3 mg  1 lozenge Oral PRN Kennedy BuckerMenz, Michael, MD       Or  . phenol (CHLORASEPTIC) mouth spray 1 spray  1 spray Mouth/Throat PRN Kennedy BuckerMenz, Michael, MD      . metFORMIN (GLUCOPHAGE-XR) 24 hr  tablet 500 mg  500 mg Oral BID Kennedy BuckerMenz, Michael, MD      . methocarbamol (ROBAXIN) tablet 500 mg  500 mg Oral Q6H PRN Kennedy BuckerMenz, Michael, MD       Or  . methocarbamol (ROBAXIN) 500 mg in dextrose 5 % 50 mL IVPB  500 mg Intravenous Q6H PRN Kennedy BuckerMenz, Michael, MD      . metoCLOPramide (REGLAN) tablet 5-10 mg  5-10 mg Oral Q8H PRN Kennedy BuckerMenz, Michael, MD       Or  . metoCLOPramide (REGLAN) injection 5-10 mg  5-10 mg Intravenous Q8H PRN Kennedy BuckerMenz, Michael, MD      . morphine 2 MG/ML injection 0.5-1 mg  0.5-1 mg Intravenous Q2H PRN Kennedy BuckerMenz, Michael, MD      . ondansetron [facility](ZOFRAN) tablet 4 mg  4 mg Oral Q6H PRN Kennedy BuckerMenz, Michael, MD       Or  . ondansetron [facility](ZOFRAN) injection 4 mg  4 mg Intravenous Q6H PRN Kennedy BuckerMenz, Michael, MD      . senna-docusate (Senokot-S) tablet 1 tablet  1 tablet Oral QHS PRN Kennedy BuckerMenz, Michael, MD      . traMADol Janean Sark(ULTRAM) tablet 50 mg  50 mg Oral Q6H Kennedy BuckerMenz, Michael, MD      . zolpidem (AMBIEN) tablet 5 mg  5 mg Oral QHS PRN Kennedy BuckerMenz, Michael, MD         Discharge Medications: Please see discharge summary for a list of discharge medications.  Relevant Imaging Results:  Relevant Lab Results:   Additional Information SSN [id number]  Ruthe Mannanandace  Raygan Skarda, [place]

## 2018-03-22 NOTE — Op Note (Signed)
03/22/2018  1:40 PM  PATIENT:  Tammy Brewer  62 y.o. female  PRE-OPERATIVE DIAGNOSIS:  primary osteoarthritis of right knee  POST-OPERATIVE DIAGNOSIS:  primary osteoarthritis of right knee  PROCEDURE:  Procedure(s): TOTAL KNEE ARTHROPLASTY (Right)  SURGEON: Leitha SchullerMichael J Avrey Flanagin, MD  ASSISTANTS: None  ANESTHESIA:   spinal  EBL:  Total I/O In: 1000 [I.V.:1000] Out: 400 [Urine:400]  BLOOD ADMINISTERED:none  DRAINS: none   LOCAL MEDICATIONS USED:  MARCAINE    and OTHER morphine Toradol and Exparel  SPECIMEN:  No Specimen  DISPOSITION OF SPECIMEN:  N/A  COUNTS:  YES  TOURNIQUET:  * Missing tourniquet times found for documented tourniquets in log: [id number] *  IMPLANTS: Medacta GMK Sphere 2+ right femur, 2 tibia with short stem, 14 mm size 2 right insert with a size 1 patella, all components cemented  DICTATION: .Dragon Dictation   patient was brought to the operating room and after adequate anesthesia was obtained  right leg was prepped and draped in the sterile fashion with tourniquet applied to the right upper thigh . After patient identification and timeout procedure was completed the tourniquet was raised and midline skin incision was made followed by medial parapatellar arthrotomy. Inspection revealed significant patellofemoral degenerative change of medial compartment, there was exposed bone and eburnation of the medial compartment both femoral and tibial condyles with moderate mild lateral compartment osteo arthritis. There is also synovitis present within the joint which was excised. The ACL and fat pad were excised as well. The proximal tibia was exposed and the my knee cutting guide was applied proximal tibia cut carried out t.   Distal femoral cutting guide applied in a similar fashion and distal femoral cut carried outThe femur was then prepared with size 2+ cutting guide with anterior posterior and chamfer cuts made with appropriate size cuts and this appeared to fit  well the femoral trial was placed distal drill holes made followed by the trochlear groove cut. The tibia was prepared using the tibial template baseplate size 2 this was pinned into position proximal tibial preparation carried out for short stem. A 14 mm insert gave excellent stability and full extension. The trials were removed at this point and the bony surfaces thoroughly irrigated and dried after the patellar cutting been carried out with the patellar cutting guide drill holes made and sized to a size 1 the above local was then infiltrated in the periarticular tissues and the bony surfaces dried the tibial component was cemented to place first with excess cement removed followed by the polyethylene component with the set screws and a torque screwdriver. The femoral component was placed in the knee held in extension as the cement set with the patellar button clamped into place. After the cement had set, excess cement was removed and the knee was thoroughly again thoroughly irrigated with tourniquet let down. A lateral release was performed because of some tendency for the patella to want to track laterally. The arthrotomy was then repaired using  heavy Quill, .3-0 v-loccuticular followed by skin staples.  Wound were then dressed with Xeroform 4 x 4's ABD web roll Polar Care and Ace wrap    PLAN OF CARE: Admit to inpatient   PATIENT DISPOSITION:  PACU - hemodynamically stable.

## 2018-03-23 ENCOUNTER — Encounter: Payer: Self-pay | Admitting: Orthopedic Surgery

## 2018-03-23 LAB — BASIC METABOLIC PANEL
Anion gap: 7 (ref 5–15)
BUN: 17 mg/dL (ref 8–23)
CO2: 23 mmol/L (ref 22–32)
Calcium: 8.4 mg/dL — ABNORMAL LOW (ref 8.9–10.3)
Chloride: 109 mmol/L (ref 98–111)
Creatinine, Ser: 0.34 mg/dL — ABNORMAL LOW (ref 0.44–1.00)
GFR calc Af Amer: >60 mL/min (ref >60)
GFR calc non Af Amer: >60 mL/min (ref >60)
Glucose, Bld: 164 mg/dL — ABNORMAL HIGH (ref 70–99)
Potassium: 3.7 mmol/L (ref 3.5–5.1)
Sodium: 139 mmol/L (ref 135–145)

## 2018-03-23 LAB — CBC
HCT: 36.2 % (ref 35.0–47.0)
Hemoglobin: 12.5 g/dL (ref 12.0–16.0)
MCH: 32 pg (ref 26.0–34.0)
MCHC: 34.4 g/dL (ref 32.0–36.0)
MCV: 92.9 fL (ref 80.0–100.0)
Platelets: 211 10*3/uL (ref 150–440)
RBC: 3.9 MIL/uL (ref 3.80–5.20)
RDW: 13.5 % (ref 11.5–14.5)
WBC: 7.7 10*3/uL (ref 3.6–11.0)

## 2018-03-23 LAB — GLUCOSE, CAPILLARY
Glucose-Capillary: 152 mg/dL — ABNORMAL HIGH (ref 70–99)
Glucose-Capillary: 147 mg/dL — ABNORMAL HIGH (ref 70–99)
Glucose-Capillary: 145 mg/dL — ABNORMAL HIGH (ref 70–99)
Glucose-Capillary: 171 mg/dL — ABNORMAL HIGH (ref 70–99)

## 2018-03-23 NOTE — Care Management Note (Signed)
Case Management Note  Patient Details  Name: ADISEN BENNION MRN: [id number] Date of Birth: [birth year]  Subjective/Objective:  POD # 1 right total knee. Met with son, Delos Haring,  at bedside to discuss discharge plan. Presented him with a list of home care providers. He chose [facility] for HHPT. Referral to [facility]. She will need a walker and bedside commode. Ordered from [place] with [facility]. Patient will discharge home on ASA. Spoke with [facility], son requested a bilingual PT staff. It is anticipated patient will discharge home tomorrow.                  Action/Plan: [facility] for HHPT, [facility] for walker and bsc. ASA  Expected Discharge Date:  03/24/18               Expected Discharge Plan:  [place]  In-House Referral:     Discharge planning Services  CM Consult  Post Acute Care Choice:  Durable Medical Equipment, Home Health Choice offered to:  Adult Children  DME Arranged:  Bedside commode, Walker rolling DME Agency:  [place] Arranged:  [place] Agency:  [facility] ([facility]  Status of Service:  In process, will continue to follow  If discussed at Long Length of Stay Meetings, dates discussed:    Additional Comments:  Jolly Mango, RN 03/23/2018, 12:37 PM

## 2018-03-23 NOTE — Evaluation (Signed)
Occupational Therapy Evaluation Patient Details Name: Tammy Brewer MRN: [id number] DOB: [birth year] Today's Date: 03/23/2018    History of Present Illness Patient is 63 female s/p R TKA 03/22/18, WBAT. PMH of anxiety, diabetes II, Bell's Palsy    Clinical Impression   Pt seen for OT evaluation this date, POD#1 from above surgery. Spanish interpreter Control and instrumentation engineer) utilized t/o session. Family present as well. Pt was independent in all ADLs prior to surgery, working, and does not drive. Pt reports becoming increasingly limited in mobility and ADL independence secondary to R knee pain. Pt is eager to return to PLOF with less pain and improved safety and independence. Pt currently requires minimal assist for LB dressing and bathing while in seated position due to pain and limited AROM of R knee. Pt/family instructed in polar care mgt, falls prevention strategies, home/routines modifications, DME/AE for LB bathing and dressing tasks, and compression stocking mgt. Educated in use of BSC beside bed overnight for toileting needs and overtop regular height toilet during the day to maximize safety and independence with toilet transfers. Pt/family verbalized understanding of all education/training provided. Pt would benefit from skilled OT services including additional instruction in dressing techniques with or without assistive devices for dressing and bathing skills to support recall and carryover prior to discharge and ultimately to maximize safety, independence, and minimize falls risk and caregiver burden. Do not currently anticipate any OT needs following this hospitalization.       Follow Up Recommendations  No OT follow up    Equipment Recommendations  3 in 1 bedside commode;Other (comment)(reacher)    Recommendations for Other Services       Precautions / Restrictions Precautions Precautions: Knee Precaution Booklet Issued: Yes (comment) Precaution Comments: in  spanish Restrictions Weight Bearing Restrictions: Yes RLE Weight Bearing: Weight bearing as tolerated      Mobility Bed Mobility     General bed mobility comments: deferred, up in recliner  Transfers Overall transfer level: Needs assistance Equipment used: Rolling walker (2 wheeled) Transfers: Sit to/from Stand Sit to Stand: Min guard              Balance Overall balance assessment: Needs assistance Sitting-balance support: Feet supported Sitting balance-Leahy Scale: Good     Standing balance support: Bilateral upper extremity supported Standing balance-Leahy Scale: Fair                             ADL either performed or assessed with clinical judgement   ADL Overall ADL's : Needs assistance/impaired Eating/Feeding: Sitting;Independent   Grooming: Sitting;Independent   Upper Body Bathing: Sitting;Independent   Lower Body Bathing: Sit to/from stand;Minimal assistance;With caregiver independent assisting   Upper Body Dressing : Sitting;Independent   Lower Body Dressing: Sit to/from stand;Minimal assistance;With caregiver independent assisting Lower Body Dressing Details (indicate cue type and reason): pt/family instructed in compression stocking mgt and AE for LB dressing tasks to maximize safety and independence  Toilet Transfer: RW;Min guard;Ambulation Toilet Transfer Details (indicate cue type and reason): educated in benefits of 3:1 for toileting overnight and during the day         Functional mobility during ADLs: Min guard;Rolling walker       Vision Baseline Vision/History: Wears glasses Wears Glasses: Reading only Patient Visual Report: No change from baseline       Perception     Praxis      Pertinent Vitals/Pain Pain Assessment: No/denies pain Pain Score: 5  Pain Location:  with R knee exercises Pain Descriptors / Indicators: Aching;Burning;Sore Pain Intervention(s): Premedicated before session;Monitored during session      Hand Dominance Right   Extremity/Trunk Assessment Upper Extremity Assessment Upper Extremity Assessment: Overall WFL for tasks assessed   Lower Extremity Assessment Lower Extremity Assessment: Defer to PT evaluation RLE Deficits / Details: 3/5 RLE: Unable to fully assess due to pain LLE Deficits / Details: at least 3+/5       Communication Communication Communication: Interpreter utilized(Spanish interpreter Conservation officer, historic buildings utilized)   Cognition Arousal/Alertness: Awake/alert Behavior During Therapy: WFL for tasks assessed/performed Overall Cognitive Status: Within Functional Limits for tasks assessed                                     General Comments       Exercises Other Exercises Other Exercises: Pt/family educated in falls prevention strategies Other Exercises: Pt/family instructed in polar care mgt   Shoulder Instructions      Home Living Family/patient expects to be discharged to:: Private residence(Will be discharging to sons house, daughter/husband will be available to help as well) Living Arrangements: Spouse/significant other;Children Available Help at Discharge: Family;Available 24 hours/day Type of Home: House Home Access: Other (comment);Stairs to enter [employer] of Steps: threshold step Entrance Stairs-Rails: None Home Layout: One level   Alternate Level Stairs-Rails: None Bathroom Shower/Tub: Chief Strategy Officer: Standard Bathroom Accessibility: Yes   Home Equipment: None          Prior Functioning/Environment Level of Independence: Independent        Comments: Pt indep with mobility, ADL, IADL including working (seated assembly work), does not drive. Strong family support. 1 fall in past 12 months.        OT Problem List: Decreased strength;Decreased knowledge of use of DME or AE;Decreased range of motion;Impaired balance (sitting and/or standing)      OT Treatment/Interventions:  Self-care/ADL training;Therapeutic exercise;Balance training;Therapeutic activities;DME and/or AE instruction;Patient/family education    OT Goals(Current goals can be found in the care plan section) Acute Rehab OT Goals Patient Stated Goal: go to family's home and recover OT Goal Formulation: With patient/family Time For Goal Achievement: 04/06/18 Potential to Achieve Goals: Good ADL Goals Pt Will Perform Lower Body Dressing: with supervision;sit to/from stand;with adaptive equipment Pt Will Transfer to Toilet: with supervision;ambulating(BSC over toilet as needed, LRAD for amb) Additional ADL Goal #1: Pt will independently instruct family in polar care mgt to maximize safety/adherence during recovery. Additional ADL Goal #2: Pt will independently instruct family in compression stocking mgt to maximize safety/adherence during recovery.  OT Frequency: Min 1X/week   Barriers to D/C:            Co-evaluation              AM-PAC PT "6 Clicks" Daily Activity     Outcome Measure Help from another person eating meals?: None Help from another person taking care of personal grooming?: None Help from another person toileting, which includes using toliet, bedpan, or urinal?: A Little Help from another person bathing (including washing, rinsing, drying)?: A Little Help from another person to put on and taking off regular upper body clothing?: None Help from another person to put on and taking off regular lower body clothing?: A Little 6 Click Score: 21   End of Session    Activity Tolerance: Patient tolerated treatment well Patient left: in chair;with call bell/phone within reach;with  chair alarm set;with family/visitor present;with SCD's reapplied;Other (comment)(polar care in place)  OT Visit Diagnosis: Other abnormalities of gait and mobility (R26.89);Muscle weakness (generalized) (M62.81)                Time: [id number] OT Time Calculation (min): 16 min Charges:  OT General  Charges $OT Visit: 1 Visit OT Evaluation $OT Eval Low Complexity: 1 Low OT Treatments $Self Care/Home Management : 8-22 mins  Richrd PrimeJamie Stiller, MPH, MS, OTR/L ascom [phone] 03/23/18, 11:38 AM

## 2018-03-23 NOTE — Evaluation (Addendum)
Physical Therapy Evaluation Patient Details Name: Tammy Brewer MRN: [id number] DOB: 24-Jun-1956 Today's Date: 03/23/2018   History of Present Illness  Patient is [age] female s/p R TKA 03/22/18, WBAT. PMH of anxiety, diabetes II, Bell's Palsy   Clinical Impression  Patient A&Ox4 at start of session, with family at bedside. Patient reports no pain in R knee at rest. Medical Interpreter Lowella FairyJacqui Laukaitis utilized throughout session. PT and patient spent time reviewing precautions, expectations during hospital stay, role of PT and PT POC. Patient also reported living independently in two story home with husband prior to admission, but plans on staying with her other son and daughter in a 1 story home where she will have 24/7 supervision/assistance.  The patient demonstrated good effort and participation with therapeutic exercises, AAROM for SLR and hip abduction/hip adduciton this AM due to increased pain. The patient mobilized to EOB with minAx1 and transferred with RW and CGA. Verbal/visual cues throughout transfers/bed mobility for proper technique/sequencing. The patient ambulated ~4270ft with RW and CGA, able to progress from step to gait pattern to step through with minimal verbal prompting. The patient would benefit from further skilled PT to address changes from PLOF including decreased strength, endurance, activity tolerance, ROM, R knee pain, and gait and balance abnormalities.      Follow Up Recommendations Home health PT    Equipment Recommendations  Rolling walker with 5" wheels    Recommendations for Other Services       Precautions / Restrictions Precautions Precautions: Knee Precaution Booklet Issued: Yes (comment) Precaution Comments: in spanish Restrictions Weight Bearing Restrictions: Yes RLE Weight Bearing: Weight bearing as tolerated      Mobility  Bed Mobility Overal bed mobility: Needs Assistance Bed Mobility: Supine to Sit     Supine to sit: Min assist      General bed mobility comments: Patient needed minAx1 for LE management and to scoot fully to EOB.  Transfers Overall transfer level: Needs assistance Equipment used: Rolling walker (2 wheeled) Transfers: Sit to/from Stand Sit to Stand: Min guard            Ambulation/Gait   Gait Distance (Feet): 70 Feet Assistive device: Rolling walker (2 wheeled) Gait Pattern/deviations: Step-to pattern;Step-through pattern;Decreased weight shift to right        Stairs            Wheelchair Mobility    Modified Rankin (Stroke Patients Only)       Balance Overall balance assessment: Needs assistance Sitting-balance support: Feet supported Sitting balance-Leahy Scale: Good       Standing balance-Leahy Scale: Fair                               Pertinent Vitals/Pain Pain Assessment: 0-10 Pain Score: 5  Pain Location: with R knee exercises Pain Descriptors / Indicators: Aching;Burning;Sore Pain Intervention(s): Limited activity within patient's tolerance;Monitored during session;Ice applied;Repositioned;Premedicated before session    Home Living Family/patient expects to be discharged to:: Private residence(Will be discharging to sons house, daughter/husband will be available to help as well.) Living Arrangements: Spouse/significant other;Children Available Help at Discharge: Family Type of Home: House Home Access: Other (comment);Stairs to enter Entrance Stairs-Rails: None Entrance Stairs-Number of Steps: threshold step Home Layout: One level Home Equipment: None      Prior Function Level of Independence: Independent               Hand Dominance   Dominant Hand: Right  Extremity/Trunk Assessment   Upper Extremity Assessment Upper Extremity Assessment: Overall WFL for tasks assessed    Lower Extremity Assessment Lower Extremity Assessment: RLE deficits/detail;LLE deficits/detail RLE Deficits / Details: 3/5 RLE: Unable to fully assess  due to pain LLE Deficits / Details: at least 3+/5       Communication   Communication: Other (comment)(intrepreter)  Cognition Arousal/Alertness: Awake/alert Behavior During Therapy: WFL for tasks assessed/performed Overall Cognitive Status: Within Functional Limits for tasks assessed                                        General Comments      Exercises Total Joint Exercises Ankle Circles/Pumps: AROM;Both;20 reps Quad Sets: AROM;Both;20 reps Gluteal Sets: AROM;Both;20 reps Hip ABduction/ADduction: AAROM;Right;20 reps;AROM;Left;10 reps Straight Leg Raises: AROM;Left;10 reps;AAROM;Right;20 reps   Assessment/Plan    PT Assessment Patient needs continued PT services  PT Problem List Decreased strength;Decreased range of motion;Decreased activity tolerance;Decreased knowledge of use of DME;Decreased balance;Pain;Decreased knowledge of precautions;Decreased mobility       PT Treatment Interventions DME instruction;Balance training;Gait training;Neuromuscular re-education;Functional mobility training;Patient/family education;Therapeutic activities;Therapeutic exercise    PT Goals (Current goals can be found in the Care Plan section)  Acute Rehab PT Goals Patient Stated Goal: Patient wants to discharge to her son's house PT Goal Formulation: With patient/family Time For Goal Achievement: 04/06/18 Potential to Achieve Goals: Good    Frequency BID   Barriers to discharge        Co-evaluation               AM-PAC PT "6 Clicks" Daily Activity  Outcome Measure Difficulty turning over in bed (including adjusting bedclothes, sheets and blankets)?: Unable Difficulty moving from lying on back to sitting on the side of the bed? : Unable Difficulty sitting down on and standing up from a chair with arms (e.g., wheelchair, bedside commode, etc,.)?: Unable Help needed moving to and from a bed to chair (including a wheelchair)?: A Little Help needed walking in  hospital room?: A Little Help needed climbing 3-5 steps with a railing? : A Little 6 Click Score: 12    End of Session Equipment Utilized During Treatment: Gait belt Activity Tolerance: Patient tolerated treatment well Patient left: with chair alarm set;in chair;with call bell/phone within reach;with SCD's reapplied(polar care in place, heels elevated)   PT Visit Diagnosis: Unsteadiness on feet (R26.81);Other abnormalities of gait and mobility (R26.89);Pain Pain - Right/Left: Right Pain - part of body: Knee    Time: 1610-9604 PT Time Calculation (min) (ACUTE ONLY): 50 min   Charges:   PT Evaluation $PT Eval Low Complexity: 1 Low PT Treatments $Gait Training: 8-22 mins $Therapeutic Exercise: 8-22 mins       Olga Coaster PT, DPT 9:46 AM,03/23/18 [phone]

## 2018-03-23 NOTE — Anesthesia Postprocedure Evaluation (Signed)
Anesthesia Post Note  Patient: Tammy Brewer  Procedure(s) Performed: TOTAL KNEE ARTHROPLASTY (Right Knee)  Patient location during evaluation: Nursing Unit Anesthesia Type: Spinal Level of consciousness: awake and alert and oriented Pain management: satisfactory to patient Vital Signs Assessment: post-procedure vital signs reviewed and stable Respiratory status: respiratory function stable Cardiovascular status: stable Postop Assessment: no headache, no backache, no apparent nausea or vomiting, spinal receding, adequate PO intake, patient able to bend at knees and able to ambulate Anesthetic complications: no     Last Vitals:  Vitals:   03/23/18 0023 03/23/18 0346  BP: (!) 102/58 109/63  Pulse: 76 92  Resp: 18 12  Temp: (!) 36.4 C 37.1 C  SpO2: 98% 95%    Last Pain:  Vitals:   03/23/18 0602  TempSrc:   PainSc: 2                  Clydene PughBeane, Rashauna Tep D

## 2018-03-23 NOTE — Progress Notes (Addendum)
Physical Therapy Treatment Patient Details Name: Tammy Brewer MRN: [id number] DOB: 1956-04-16 Today's Date: 03/23/2018    History of Present Illness Patient is 83 female s/p R TKA 03/22/18, WBAT. PMH of anxiety, diabetes II, Bell's Palsy     PT Comments    Patient easily woken at start of session, reports that her knee is more painful than this AM, 6/10 pain. Patient able to participate in exercises this session, AAROM for most exercises for pain management. Patient needed minAx1 for supine to sit, and mod Ax1 from sit to supine during this session due to increased pain, sit <> stand with CGA, extended time and verbal cues for hand placement. Patient ambulated ~52ft with intermittent step through gait pattern, much more antalgic compared to this AM session. Patient also demonstrated bedside commode transfer and toileting needs with CGA. Patient returned to bed with all needs in reach at end of session. Nursing staff notified of patients pain level and progress. Medical translator utilized throughout session: Lemar Lofty.    Follow Up Recommendations  Home health PT     Equipment Recommendations  Rolling walker with 5" wheels    Recommendations for Other Services       Precautions / Restrictions Precautions Precautions: Knee Restrictions Weight Bearing Restrictions: Yes RLE Weight Bearing: Weight bearing as tolerated    Mobility  Bed Mobility Overal bed mobility: Needs Assistance Bed Mobility: Supine to Sit;Sit to Supine     Supine to sit: Min assist Sit to supine: Mod assist   General bed mobility comments: Needed assistance with LE management, sit to supine LE and trunk control assistance needed  Transfers Overall transfer level: Needs assistance Equipment used: Rolling walker (2 wheeled) Transfers: Sit to/from Stand Sit to Stand: Min guard            Ambulation/Gait   Gait Distance (Feet): 40 Feet Assistive device: Rolling walker (2 wheeled) Gait  Pattern/deviations: Antalgic;Step-to pattern;Step-through pattern     General Gait Details: Patient alternated between step to gait pattern and step through gait pattern. Patient exhibited grimacing and pain throughout mobility.    Stairs             Wheelchair Mobility    Modified Rankin (Stroke Patients Only)       Balance Overall balance assessment: Needs assistance Sitting-balance support: Feet supported Sitting balance-Leahy Scale: Good     Standing balance support: Bilateral upper extremity supported Standing balance-Leahy Scale: Fair                              Cognition Arousal/Alertness: Awake/alert Behavior During Therapy: WFL for tasks assessed/performed Overall Cognitive Status: Within Functional Limits for tasks assessed                                        Exercises Total Joint Exercises Ankle Circles/Pumps: AROM;Both;20 reps Quad Sets: AROM;Both;20 reps Short Arc QuadBarbaraann Boys;Right;10 reps Heel Slides: AAROM;Right;10 reps Straight Leg Raises: AAROM;Right;10 reps Goniometric ROM: R knee extension: -10deg, R knee flexion: 45deg Other Exercises Other Exercises: Patient demonstrated commode transfer during session, CGA. Other Exercises: Pt/family instructed in polar care mgt    General Comments        Pertinent Vitals/Pain Pain Assessment: 0-10 Pain Score: 6  Pain Location: R knee Pain Descriptors / Indicators: Aching;Burning;Sore Pain Intervention(s): Premedicated before session;Repositioned;Monitored during session;Limited activity within  patient's tolerance;Ice applied    Home Living Family/patient expects to be discharged to:: Private residence(Will be discharging to sons house, daughter/husband will be available to help as well) Living Arrangements: Spouse/significant other;Children Available Help at Discharge: Family;Available 24 hours/day Type of Home: House Home Access: Other (comment);Stairs to  enter Entrance Stairs-Rails: None Home Layout: One level Home Equipment: None      Prior Function Level of Independence: Independent      Comments: Pt indep with mobility, ADL, IADL including working (seated assembly work), does not drive. Strong family support. 1 fall in past 12 months.   PT Goals (current goals can now be found in the care plan section) Acute Rehab PT Goals Patient Stated Goal: go to family's home and recover Progress towards PT goals: Progressing toward goals    Frequency    BID      PT Plan Current plan remains appropriate    Co-evaluation              AM-PAC PT "6 Clicks" Daily Activity  Outcome Measure  Difficulty turning over in bed (including adjusting bedclothes, sheets and blankets)?: Unable Difficulty moving from lying on back to sitting on the side of the bed? : Unable Difficulty sitting down on and standing up from a chair with arms (e.g., wheelchair, bedside commode, etc,.)?: Unable Help needed moving to and from a bed to chair (including a wheelchair)?: A Little Help needed walking in hospital room?: A Little Help needed climbing 3-5 steps with a railing? : A Little 6 Click Score: 12    End of Session Equipment Utilized During Treatment: Gait belt Activity Tolerance: Patient limited by fatigue;Patient limited by pain Patient left: with SCD's reapplied;in bed;with bed alarm set;with call bell/phone within reach;with family/visitor present(heels elevated, polar care applied, bone foam in place) Nurse Communication: Mobility status PT Visit Diagnosis: Unsteadiness on feet (R26.81);Other abnormalities of gait and mobility (R26.89);Pain Pain - Right/Left: Right Pain - part of body: Knee     Time: [id number] PT Time Calculation (min) (ACUTE ONLY): 38 min  Charges:  $Gait Training: 8-22 mins $Therapeutic Exercise: 8-22 mins $Therapeutic Activity: 8-22 mins                    Olga Coasteriana Xitlalic Maslin PT, DPT 2:58  PM,03/23/18 [phone]

## 2018-03-23 NOTE — Progress Notes (Signed)
Clinical Social Worker (CSW) received SNF consult. PT is recommending home health. RN case manager aware of above. Please reconsult if future social work needs arise. CSW signing off.   Eryn Marandola, LCSW [phone] 

## 2018-03-23 NOTE — Progress Notes (Signed)
  Subjective: 1 Day Post-Op Procedure(s) (LRB): TOTAL KNEE ARTHROPLASTY (Right) Patient reports pain as mild in the right quad.  Son in room to help with interpretation.   Patient is well, and has had no acute complaints or problems PT and care management to assist with discharge planning. Negative for chest pain and shortness of breath Fever: no Gastrointestinal:Negative for nausea and vomiting  Objective: Vital signs in last 24 hours: Temp:  [97.3 F (36.3 C)-98.8 F (37.1 C)] 98.8 F (37.1 C) (07/31 0346) Pulse Rate:  [70-92] 92 (07/31 0346) Resp:  [12-20] 12 (07/31 0346) BP: (98-140)/(55-72) 109/63 (07/31 0346) SpO2:  [95 %-100 %] 95 % (07/31 0346) Weight:  [71.2 kg (157 lb)-71.9 kg (158 lb 8 oz)] 71.9 kg (158 lb 8 oz) (07/30 1400)  Intake/Output from previous day:  Intake/Output Summary (Last 24 hours) at 03/23/2018 0744 Last data filed at 03/23/2018 0340 Gross per 24 hour  Intake 2402.92 ml  Output 1925 ml  Net 477.92 ml    Intake/Output this shift: No intake/output data recorded.  Labs: Recent Labs    03/23/18 0312  HGB 12.5   Recent Labs    03/23/18 0312  WBC 7.7  RBC 3.90  HCT 36.2  PLT 211   Recent Labs    03/23/18 0312  NA 139  K 3.7  CL 109  CO2 23  BUN 17  CREATININE 0.34*  GLUCOSE 164*  CALCIUM 8.4*   No results for input(s): LABPT, INR in the last 72 hours.   EXAM General - Patient is Alert, Appropriate and Oriented Extremity - ABD soft Sensation intact distally Intact pulses distally Dorsiflexion/Plantar flexion intact Incision: dressing C/D/I No cellulitis present Dressing/Incision - Bulky dressing intact.  No evidence of drainage. Motor Function - intact, moving foot and toes well on exam.  Abdomen soft with normal BS.  Past Medical History:  Diagnosis Date  . Anxiety   . Bell's palsy   . Diabetes mellitus without complication (HCC)   . High cholesterol     Assessment/Plan: 1 Day Post-Op Procedure(s) (LRB): TOTAL  KNEE ARTHROPLASTY (Right) Active Problems:   Status post total knee replacement using cement, right  Estimated body mass index is 27.21 kg/m as calculated from the following:   Height as of this encounter: 5\' 4"  (1.626 m).   Weight as of this encounter: 71.9 kg (158 lb 8 oz). Advance diet Up with therapy D/C IV fluids when tolerating po intake.  Labs reviewed this AM. Up with therapy today Begin working on BM. Will remove bulky dressing tomorrow morning. Possible discharge tomorrow pending progress with PT.  DVT Prophylaxis - Aspirin, Foot Pumps and TED hose Weight-Bearing as tolerated to right leg  [name], PA-C [facility] 03/23/2018, 7:44 AM

## 2018-03-24 ENCOUNTER — Inpatient Hospital Stay: Payer: Managed Care, Other (non HMO)

## 2018-03-24 LAB — URINALYSIS, COMPLETE (UACMP) WITH MICROSCOPIC
Bacteria, UA: NONE SEEN
Bilirubin Urine: NEGATIVE
Glucose, UA: >=500 mg/dL — AB
Ketones, ur: 80 mg/dL — AB
Leukocytes, UA: NEGATIVE
Nitrite: NEGATIVE
Protein, ur: NEGATIVE mg/dL
Specific Gravity, Urine: 1.02 (ref 1.005–1.030)
pH: 6 (ref 5.0–8.0)

## 2018-03-24 LAB — CBC
HCT: 38.1 % (ref 35.0–47.0)
Hemoglobin: 13 g/dL (ref 12.0–16.0)
MCH: 31.8 pg (ref 26.0–34.0)
MCHC: 34 g/dL (ref 32.0–36.0)
MCV: 93.4 fL (ref 80.0–100.0)
Platelets: 201 10*3/uL (ref 150–440)
RBC: 4.08 MIL/uL (ref 3.80–5.20)
RDW: 13.9 % (ref 11.5–14.5)
WBC: 8.3 10*3/uL (ref 3.6–11.0)

## 2018-03-24 LAB — GLUCOSE, CAPILLARY
Glucose-Capillary: 161 mg/dL — ABNORMAL HIGH (ref 70–99)
Glucose-Capillary: 180 mg/dL — ABNORMAL HIGH (ref 70–99)
Glucose-Capillary: 133 mg/dL — ABNORMAL HIGH (ref 70–99)
Glucose-Capillary: 181 mg/dL — ABNORMAL HIGH (ref 70–99)

## 2018-03-24 MED ORDER — FUROSEMIDE 20 MG PO TABS
20.0000 mg | ORAL_TABLET | Freq: Every day | ORAL | Status: DC
  Administered 2018-03-24 – 2018-03-25 (×2): 20 mg via ORAL
  Filled 2018-03-24 (×2): qty 1

## 2018-03-24 NOTE — Progress Notes (Signed)
Physical Therapy Treatment Patient Details Name: Tammy Brewer MRN: [id number] DOB: 05/17/1956 Today's Date: 03/24/2018    History of Present Illness Patient is 82 female s/p R TKA 03/22/18, WBAT. PMH of anxiety, diabetes II, Bell's Palsy     PT Comments    House interpreter for session - Jacqui  Participated in exercises as described below.  Pt was able to stand with min a x 1.  She was able to complete a full lap around nursing unit this am with overall slow but steady gait.  Gait pattern and speed however did improve during session.  Fatigued and requested to return to bed for a nap.    Follow Up Recommendations  Home health PT;Supervision for mobility/OOB     Equipment Recommendations  Rolling walker with 5" wheels    Recommendations for Other Services       Precautions / Restrictions Precautions Precautions: Knee Precaution Comments: in spanish Restrictions Weight Bearing Restrictions: Yes RLE Weight Bearing: Weight bearing as tolerated    Mobility  Bed Mobility   Bed Mobility: Sit to Supine       Sit to supine: Min assist   General bed mobility comments: LE management to sit to supine  Transfers Overall transfer level: Needs assistance Equipment used: Rolling walker (2 wheeled) Transfers: Sit to/from Stand Sit to Stand: Min assist            Ambulation/Gait Ambulation/Gait assistance: Min Emergency planning/management officer (Feet): 180 Feet Assistive device: Rolling walker (2 wheeled) Gait Pattern/deviations: Step-to pattern;Step-through pattern Gait velocity: decreased   General Gait Details: initially slow step to pattern but was able to progresss to a more step thruogh pattern after time with some increased speed but gait remains slow.   Stairs             Wheelchair Mobility    Modified Rankin (Stroke Patients Only)       Balance Overall balance assessment: Needs assistance Sitting-balance support: Feet supported Sitting  balance-Leahy Scale: Good     Standing balance support: Bilateral upper extremity supported Standing balance-Leahy Scale: Fair                              Cognition Arousal/Alertness: Awake/alert Behavior During Therapy: WFL for tasks assessed/performed Overall Cognitive Status: Within Functional Limits for tasks assessed                                        Exercises Total Joint Exercises Goniometric ROM: 3-87 - limited by pain Other Exercises Other Exercises: long sitting exercises for ankle pumps, quad sets, heel slides, SLR x 10 AAROM RLE    General Comments        Pertinent Vitals/Pain Pain Assessment: 0-10 Pain Score: 7  Pain Location: R knee Pain Descriptors / Indicators: Aching;Burning;Sore Pain Intervention(s): Limited activity within patient's tolerance;Premedicated before session;Monitored during session;Ice applied    Home Living                      Prior Function            PT Goals (current goals can now be found in the care plan section) Progress towards PT goals: Progressing toward goals    Frequency    BID      PT Plan Current plan remains appropriate    Co-evaluation  AM-PAC PT "6 Clicks" Daily Activity  Outcome Measure  Difficulty turning over in bed (including adjusting bedclothes, sheets and blankets)?: Unable Difficulty moving from lying on back to sitting on the side of the bed? : Unable Difficulty sitting down on and standing up from a chair with arms (e.g., wheelchair, bedside commode, etc,.)?: Unable Help needed moving to and from a bed to chair (including a wheelchair)?: A Little Help needed walking in hospital room?: A Little Help needed climbing 3-5 steps with a railing? : A Little 6 Click Score: 12    End of Session Equipment Utilized During Treatment: Gait belt Activity Tolerance: Patient tolerated treatment well;Patient limited by pain;Patient limited by  fatigue Patient left: in bed;with bed alarm set;with call bell/phone within reach;with family/visitor present Nurse Communication: Other (comment) Pain - Right/Left: Right Pain - part of body: Knee     Time: [id number] PT Time Calculation (min) (ACUTE ONLY): 30 min  Charges:  $Gait Training: 8-22 mins $Therapeutic Exercise: 8-22 mins                     Danielle DessSarah Alakai Macbride, PTA 03/24/18, 11:16 AM

## 2018-03-24 NOTE — Progress Notes (Signed)
Physical Therapy Treatment Patient Details Name: Tammy Brewer MRN: [id number] DOB: [birth year] Today's Date: 03/24/2018    History of Present Illness Patient is 59 female s/p R TKA 03/22/18, WBAT. PMH of anxiety, diabetes II, Bell's Palsy     PT Comments    Pt ambulating out of bathroom with nurse tech upon arrival.  She continued to ambulate around unit x 1 with walker and min guard/assist.  Gait speed has increased slightly this pm but remains decreased.  She had several small buckling episodes of R knee but she was able to control without assist.  Overall progressing well.  Pt does have 2 small thresh hold steps to enter her sons home where she will stay for her recovery.  Will address in am before anticipated discharge.    Follow Up Recommendations  Home health PT;Supervision for mobility/OOB     Equipment Recommendations  Rolling walker with 5" wheels    Recommendations for Other Services       Precautions / Restrictions Precautions Precautions: Knee Precaution Comments: in spanish Restrictions Weight Bearing Restrictions: Yes RLE Weight Bearing: Weight bearing as tolerated    Mobility  Bed Mobility Overal bed mobility: Needs Assistance Bed Mobility: Sit to Supine       Sit to supine: Min assist   General bed mobility comments: LE management to sit to supine  Transfers Overall transfer level: Needs assistance Equipment used: Rolling walker (2 wheeled) Transfers: Sit to/from Stand Sit to Stand: Min assist            Ambulation/Gait Ambulation/Gait assistance: Min Emergency planning/management officer (Feet): 180 Feet Assistive device: Rolling walker (2 wheeled) Gait Pattern/deviations: Step-to pattern;Step-through pattern     General Gait Details: increased speed this pm but remains decreased.   Stairs             Wheelchair Mobility    Modified Rankin (Stroke Patients Only)       Balance Overall balance assessment: Needs  assistance Sitting-balance support: Feet supported Sitting balance-Leahy Scale: Good     Standing balance support: Bilateral upper extremity supported Standing balance-Leahy Scale: Fair                              Cognition Arousal/Alertness: Awake/alert Behavior During Therapy: WFL for tasks assessed/performed Overall Cognitive Status: Within Functional Limits for tasks assessed                                        Exercises      General Comments        Pertinent Vitals/Pain Pain Assessment: 0-10 Pain Score: 7  Pain Location: R knee Pain Descriptors / Indicators: Aching;Burning;Sore Pain Intervention(s): Limited activity within patient's tolerance;Monitored during session;Premedicated before session;Ice applied    Home Living                      Prior Function            PT Goals (current goals can now be found in the care plan section) Progress towards PT goals: Progressing toward goals    Frequency    BID      PT Plan Current plan remains appropriate    Co-evaluation              AM-PAC PT "6 Clicks" Daily Activity  Outcome Measure  Difficulty  turning over in bed (including adjusting bedclothes, sheets and blankets)?: None Difficulty moving from lying on back to sitting on the side of the bed? : Unable Difficulty sitting down on and standing up from a chair with arms (e.g., wheelchair, bedside commode, etc,.)?: Unable Help needed moving to and from a bed to chair (including a wheelchair)?: A Little Help needed walking in hospital room?: A Little Help needed climbing 3-5 steps with a railing? : A Little 6 Click Score: 15    End of Session Equipment Utilized During Treatment: Gait belt Activity Tolerance: Patient tolerated treatment well;Patient limited by pain;Patient limited by fatigue Patient left: in bed;with bed alarm set;with call bell/phone within reach;with family/visitor present   Pain -  Right/Left: Right Pain - part of body: Knee     Time: [id number] PT Time Calculation (min) (ACUTE ONLY): 17 min  Charges:  $Gait Training: 8-22 mins                    Danielle DessSarah Cassandra Mcmanaman, PTA 03/24/18, 3:22 PM

## 2018-03-24 NOTE — Progress Notes (Signed)
  Subjective: 2 Days Post-Op Procedure(s) (LRB): TOTAL KNEE ARTHROPLASTY (Right) Patient reports pain as mild in the right quad.  Son in room to help with interpretation.   Patient is well, and has had no acute complaints or problems Plan is for discharge home with HHPT. Negative for chest pain, does report mild shortness of breath this AM. Fever: Yes, 100.7 last night. Gastrointestinal:Negative for nausea and vomiting  Objective: Vital signs in last 24 hours: Temp:  [98.9 F (37.2 C)-100.7 F (38.2 C)] 98.9 F (37.2 C) (08/01 0749) Pulse Rate:  [98-111] 111 (08/01 0749) Resp:  [16-18] 18 (08/01 0749) BP: (134-143)/(73-85) 134/82 (08/01 0749) SpO2:  [94 %-99 %] 99 % (08/01 0749)  Intake/Output from previous day:  Intake/Output Summary (Last 24 hours) at 03/24/2018 1228 Last data filed at 03/24/2018 1025 Gross per 24 hour  Intake 930 ml  Output 0 ml  Net 930 ml    Intake/Output this shift: Total I/O In: 240 [P.O.:240] Out: -   Labs: Recent Labs    03/23/18 0312 03/24/18 0250  HGB 12.5 13.0   Recent Labs    03/23/18 0312 03/24/18 0250  WBC 7.7 8.3  RBC 3.90 4.08  HCT 36.2 38.1  PLT 211 201   Recent Labs    03/23/18 0312  NA 139  K 3.7  CL 109  CO2 23  BUN 17  CREATININE 0.34*  GLUCOSE 164*  CALCIUM 8.4*   No results for input(s): LABPT, INR in the last 72 hours.   EXAM General - Patient is Alert, Appropriate and Oriented Extremity - ABD soft Sensation intact distally Intact pulses distally Dorsiflexion/Plantar flexion intact Incision: dressing C/D/I No cellulitis present Dressing/Incision - Bulky dressing removed, honeycomb applied to the right knee.  No signs of infection such as purulent drainage. Motor Function - intact, moving foot and toes well on exam.  Abdomen soft with normal BS.  Past Medical History:  Diagnosis Date  . Anxiety   . Bell's palsy   . Diabetes mellitus without complication (HCC)   . High cholesterol      Assessment/Plan: 2 Days Post-Op Procedure(s) (LRB): TOTAL KNEE ARTHROPLASTY (Right) Active Problems:   Status post total knee replacement using cement, right  Estimated body mass index is 27.21 kg/m as calculated from the following:   Height as of this encounter: 5\' 4"  (1.626 m).   Weight as of this encounter: 71.9 kg (158 lb 8 oz). Advance diet Up with therapy  Labs reviewed this AM. WBC 8.3 Fevers last night, No signs of infection on UA. CXR demonstrated cardiomegaly with mild pulmonary venous congestion with a small left pleural effusion.   Started on Lasix,PT reports patient was lethargic on ambulation, will place internal medicine referral. Continue to work on having a BM. Honeycomb dressing applied today to the right knee. Possible discharge tomorrow pending progress with PT.  DVT Prophylaxis - Aspirin, Foot Pumps and TED hose Weight-Bearing as tolerated to right leg  J. Horris LatinoLance Ilissa Rosner, PA-C [facility] 03/24/2018, 12:28 PM

## 2018-03-25 LAB — MAGNESIUM: Magnesium: 2 mg/dL (ref 1.7–2.4)

## 2018-03-25 LAB — BASIC METABOLIC PANEL
Anion gap: 8 (ref 5–15)
BUN: 12 mg/dL (ref 8–23)
CO2: 26 mmol/L (ref 22–32)
Calcium: 8.6 mg/dL — ABNORMAL LOW (ref 8.9–10.3)
Chloride: 102 mmol/L (ref 98–111)
Creatinine, Ser: 0.47 mg/dL (ref 0.44–1.00)
GFR calc Af Amer: >60 mL/min (ref >60)
GFR calc non Af Amer: >60 mL/min (ref >60)
Glucose, Bld: 173 mg/dL — ABNORMAL HIGH (ref 70–99)
Potassium: 3.4 mmol/L — ABNORMAL LOW (ref 3.5–5.1)
Sodium: 136 mmol/L (ref 135–145)

## 2018-03-25 LAB — CBC
HCT: 36.3 % (ref 35.0–47.0)
Hemoglobin: 12.6 g/dL (ref 12.0–16.0)
MCH: 32.3 pg (ref 26.0–34.0)
MCHC: 34.8 g/dL (ref 32.0–36.0)
MCV: 92.9 fL (ref 80.0–100.0)
Platelets: 192 10*3/uL (ref 150–440)
RBC: 3.91 MIL/uL (ref 3.80–5.20)
RDW: 13.6 % (ref 11.5–14.5)
WBC: 9.2 10*3/uL (ref 3.6–11.0)

## 2018-03-25 LAB — GLUCOSE, CAPILLARY
Glucose-Capillary: 175 mg/dL — ABNORMAL HIGH (ref 70–99)
Glucose-Capillary: 194 mg/dL — ABNORMAL HIGH (ref 70–99)

## 2018-03-25 MED ORDER — DOCUSATE SODIUM 100 MG PO CAPS: 100.0000 mg | ORAL_CAPSULE | Freq: Two times a day (BID) | ORAL | 0 refills | Status: DC

## 2018-03-25 MED ORDER — HYDROCODONE-ACETAMINOPHEN 5-325 MG PO TABS: 1.0000 | ORAL_TABLET | ORAL | 0 refills | Status: DC | PRN

## 2018-03-25 MED ORDER — POTASSIUM CHLORIDE 20 MEQ PO PACK
40.0000 meq | PACK | Freq: Once | ORAL | Status: AC
  Administered 2018-03-25: 40 meq via ORAL
  Filled 2018-03-25: qty 2

## 2018-03-25 MED ORDER — ASPIRIN 325 MG PO TBEC: 325.0000 mg | DELAYED_RELEASE_TABLET | Freq: Every day | ORAL | 0 refills | Status: DC

## 2018-03-25 MED ORDER — BISACODYL 10 MG RE SUPP
10.0000 mg | Freq: Once | RECTAL | Status: AC
  Administered 2018-03-25: 10 mg via RECTAL
  Filled 2018-03-25: qty 1

## 2018-03-25 MED ORDER — METHOCARBAMOL 500 MG PO TABS: 500.0000 mg | ORAL_TABLET | Freq: Four times a day (QID) | ORAL | 0 refills | Status: DC | PRN

## 2018-03-25 NOTE — Care Management Note (Signed)
Case Management Note  Patient Details  Name: Tammy Brewer MRN: [id number] Date of Birth: 11-18-1955  Subjective/Objective:   Discharging today                 Action/Plan: [facility] made aware of discharge.   Expected Discharge Date:  03/25/18               Expected Discharge Plan:  Home w Home Health Services  In-House Referral:     Discharge planning Services  CM Consult  Post Acute Care Choice:  Durable Medical Equipment, Home Health Choice offered to:  Adult Children  DME Arranged:  Bedside commode, Walker rolling DME Agency:  [facility]  HH Arranged:  PT HH Agency[facility]  [facility]  Status of Service:  Completed, signed off  If discussed at [employer] Length of Stay Meetings, dates discussed:    Additional Comments:  Marily MemosLisa M Jerusalem Brownstein, RN 03/25/2018, 3:09 PM

## 2018-03-25 NOTE — H&P (Signed)
[facility] [place] [facility]   PATIENT NAME: Tammy Brewer    MR#:  [id number]  DATE OF BIRTH:  Jun 10, 1956  DATE OF ADMISSION:  03/22/2018  PRIMARY CARE PHYSICIAN: Lauro Regulus, MD   REQUESTING/REFERRING PHYSICIAN:   CHIEF COMPLAINT:  No chief complaint on file.   HISTORY OF PRESENT ILLNESS: Tammy Brewer  is a 62 y.o. female with a known history per below currently in the hospital status post total right knee replacement by orthopedic surgery, hospitalist service consulted for subjective fever/pleural effusion on chest x-ray discussion with the patient and nursing staff, patient evaluated at the bedside, son is present, patient currently without complaint.  PAST MEDICAL HISTORY:   Past Medical History:  Diagnosis Date  . Anxiety   . Bell's palsy   . Diabetes mellitus without complication (HCC)   . High cholesterol     PAST SURGICAL HISTORY:  Past Surgical History:  Procedure Laterality Date  . APPENDECTOMY    . CHOLECYSTECTOMY    . DILATION AND CURETTAGE OF UTERUS    . 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   Procedure: TOTAL KNEE ARTHROPLASTY;  Surgeon: Kennedy Bucker, MD;  Location: [facility] ORS;  Service: Orthopedics;  Laterality: Right;    SOCIAL HISTORY:  Social History   Tobacco Use  . Smoking status: Never Smoker  . Smokeless tobacco: Never Used  Substance Use Topics  . Alcohol use: No    FAMILY HISTORY:  HTN  DRUG ALLERGIES: No Known Allergies  REVIEW OF SYSTEMS:   CONSTITUTIONAL: + Subjective fever, no fatigue or weakness.  EYES: No blurred or double vision.  EARS, NOSE, AND THROAT: No tinnitus or ear pain.  RESPIRATORY: No cough, shortness of breath, wheezing or hemoptysis.  CARDIOVASCULAR: No chest pain, orthopnea, edema.  GASTROINTESTINAL: No nausea, vomiting, diarrhea or abdominal pain.  GENITOURINARY: No dysuria, hematuria.  ENDOCRINE: No polyuria, nocturia,  HEMATOLOGY: No anemia, easy bruising or bleeding SKIN: No  rash or lesion. MUSCULOSKELETAL: Knee pain    NEUROLOGIC: No tingling, numbness, weakness.  PSYCHIATRY: No anxiety or depression.   MEDICATIONS AT HOME:  Prior to Admission medications   Medication Sig Start Date End Date Taking? Authorizing Provider  empagliflozin (JARDIANCE) 25 MG TABS tablet Take 25 mg by mouth daily.    [provider]  glimepiride (AMARYL) 4 MG tablet Take 4 mg by mouth daily with breakfast.    [provider]  metFORMIN (GLUCOPHAGE-XR) 500 MG 24 hr tablet Take 500 mg by mouth 2 (two) times daily. At lunch and at bedtime    [provider]  triamcinolone ointment (KENALOG) 0.1 % Apply 1 application topically 2 (two) times daily.    [provider]      PHYSICAL EXAMINATION:   VITAL SIGNS: Blood pressure 113/72, pulse (!) 105, temperature 98 F (36.7 C), temperature source Oral, resp. rate 18, height 5\' 4"  (1.626 m), weight 71.9 kg (158 lb 8 oz), SpO2 95 %.  GENERAL:  62 y.o.-year-old patient lying in the bed with no acute distress.  EYES: Pupils equal, round, reactive to light and accommodation. No scleral icterus. Extraocular muscles intact.  HEENT: Head atraumatic, normocephalic. Oropharynx and nasopharynx clear.  NECK:  Supple, no jugular venous distention. No thyroid enlargement, no tenderness.  LUNGS: Normal breath sounds bilaterally, no wheezing, rales,rhonchi or crepitation. No use of accessory muscles of respiration.  CARDIOVASCULAR: S1, S2 normal. No murmurs, rubs, or gallops.  ABDOMEN: Soft, nontender, nondistended. Bowel sounds present. No organomegaly or mass.  EXTREMITIES:  No pedal edema, cyanosis, or clubbing.  NEUROLOGIC: Cranial nerves II through XII are intact. MAES. Sensation intact. Gait not checked.  PSYCHIATRIC: The patient is alert and oriented x 3.  SKIN: No obvious rash, lesion, or ulcer.   LABORATORY PANEL:   CBC Recent Labs  Lab 03/23/18 0312 03/24/18 0250 03/25/18 0309  WBC 7.7 8.3 9.2  HGB  12.5 13.0 12.6  HCT 36.2 38.1 36.3  PLT 211 201 192  MCV 92.9 93.4 92.9  MCH 32.0 31.8 32.3  MCHC 34.4 34.0 34.8  RDW 13.5 13.9 13.6   ------------------------------------------------------------------------------------------------------------------  Chemistries  Recent Labs  Lab 03/23/18 0312 03/25/18 0309  NA 139 136  K 3.7 3.4*  CL 109 102  CO2 23 26  GLUCOSE 164* 173*  BUN 17 12  CREATININE 0.34* 0.47  CALCIUM 8.4* 8.6*   ------------------------------------------------------------------------------------------------------------------ estimated creatinine clearance is 70.9 mL/min (by C-G formula based on SCr of 0.47 mg/dL). ------------------------------------------------------------------------------------------------------------------ No results for input(s): TSH, T4TOTAL, T3FREE, THYROIDAB in the last 72 hours.  Invalid input(s): FREET3   Coagulation profile No results for input(s): INR, PROTIME in the last 168 hours. ------------------------------------------------------------------------------------------------------------------- No results for input(s): DDIMER in the last 72 hours. -------------------------------------------------------------------------------------------------------------------  Cardiac Enzymes No results for input(s): CKMB, TROPONINI, MYOGLOBIN in the last 168 hours.  Invalid input(s): CK ------------------------------------------------------------------------------------------------------------------ Invalid input(s): POCBNP  ---------------------------------------------------------------------------------------------------------------  Urinalysis    Component Value Date/Time   COLORURINE YELLOW (A) 03/24/2018 0835   APPEARANCEUR CLEAR (A) 03/24/2018 0835   LABSPEC 1.020 03/24/2018 0835   PHURINE 6.0 03/24/2018 0835   GLUCOSEU >=500 (A) 03/24/2018 0835   HGBUR SMALL (A) 03/24/2018 0835   BILIRUBINUR NEGATIVE 03/24/2018 0835    KETONESUR 80 (A) 03/24/2018 0835   PROTEINUR NEGATIVE 03/24/2018 0835   NITRITE NEGATIVE 03/24/2018 0835   LEUKOCYTESUR NEGATIVE 03/24/2018 0835     RADIOLOGY: Dg Chest Port 1 View  Result Date: 03/24/2018 CLINICAL DATA:  Total knee replacement. EXAM: PORTABLE CHEST 1 VIEW COMPARISON:  11/13/2013. FINDINGS: Mediastinum and hilar structures normal. Cardiomegaly with mild pulmonary vascular prominence. Mild bilateral interstitial prominence. Small left pleural effusion. No pneumothorax. IMPRESSION: 1.  Cardiomegaly with mild pulmonary venous congestion. 2. Mild basilar interstitial prominence. Small left pleural effusion. Electronically Signed   By: Maisie Fushomas  Register   On: 03/24/2018 09:30    EKG: Orders placed or performed during the hospital encounter of 04/10/16  . EKG 12-Lead  . EKG 12-Lead  . ED EKG  . ED EKG  . EKG    IMPRESSION AND PLAN: *Acute pleural effusion Chest x-ray reviewed-study is quality/inspiration, pleural effusion is insignificant Continue deep breathing exercises, incentive spirometer every hour while awake  *Acute subjective fever Resolved Suspect due to postoperative atelectasis Continue physical therapy, increase activity, and all other plans as stated above  *Acute hypokalemia Replete with potassium check magnesium level  *Chronic diabetes mellitus type 2 Stable Continue,, Amaryl, metformin, sliding scale insulin with Accu-Cheks per routine  *Acute right TKR Recovering well Postoperative care per primary service    All the records are reviewed and case discussed with ED provider. Management plans discussed with the patient, family and they are in agreement.  CODE STATUS:full    Code Status Orders  (From admission, onward)        Start     Ordered   03/22/18 1442  Full code  Continuous     03/22/18 1441    Code Status History    This patient has a current code status but no historical code status.  TOTAL TIME TAKING CARE OF  THIS PATIENT: 45 minutes.    Evelena Asa Leatha Rohner M.D on 03/25/2018   Between 7am to 6pm - Pager - [phone]  After 6pm go to [email or web address] - Social research officer, government  [facility]  Office  [phone]  CC: Primary care physician; Lauro Regulus, MD   Note: This dictation was prepared with Dragon dictation along with smaller phrase technology. Any transcriptional errors that result from this process are unintentional.

## 2018-03-25 NOTE — Discharge Summary (Signed)
Physician Discharge Summary  Patient ID: Tammy Brewer MRN: [id number] DOB/AGE: 09/26/1955 62 y.o.  Admit date: 03/22/2018 Discharge date: 03/25/2018  Admission Diagnoses:  primary osteoarthritis of right knee   Discharge Diagnoses: Patient Active Problem List   Diagnosis Date Noted  . Status post total knee replacement using cement, right 03/22/2018    Past Medical History:  Diagnosis Date  . Anxiety   . Bell's palsy   . Diabetes mellitus without complication (HCC)   . High cholesterol      Transfusion: none   Consultants (if any): Treatment Team:  Salary, Evelena Asa, MD  Discharged Condition: Improved  Hospital Course: Tammy Brewer is an 62 y.o. female who was admitted 03/22/2018 with a diagnosis of <principal problem not specified> and went to the operating room on 03/22/2018 and underwent the above named procedures.    Surgeries: Procedure(s): TOTAL KNEE ARTHROPLASTY on 03/22/2018 Patient tolerated the surgery well. Taken to PACU where she was stabilized and then transferred to the orthopedic floor.  Started on aspirin 325 mg daily with SCDs and ted hose. Heels elevated on bed with rolled towels. No evidence of DVT. Negative Homan. Physical therapy started on day #1 for gait training and transfer. OT started day #1 for ADL and assisted devices.  Patient's foley was d/c on day #1. Patient's IV was d/c on day #2. Post op day 2 patient with SOB and CXR showed small pleural effusion. Patient encouraged to continue wit incentive spirometer.   On post op day #3 patient was stable and ready for discharge to home with HHPT.  Implants: Medacta GMK Sphere 2+ right femur, 2 tibia with short stem, 14 mm size 2 right insert with a size 1 patella, all components cemented    She was given perioperative antibiotics:  Anti-infectives (From admission, onward)   Start     Dose/Rate Route Frequency Ordered Stop   03/22/18 1800  ceFAZolin (ANCEF) IVPB 1 g/50 mL premix     1 g 100  mL/hr over 30 Minutes Intravenous Every 6 hours 03/22/18 1441 03/23/18 0535   03/22/18 1013  ceFAZolin (ANCEF) 2-4 GM/100ML-% IVPB    Note to Pharmacy:  Renie Ora   : cabinet override      03/22/18 1013 03/22/18 1152   03/21/18 2315  ceFAZolin (ANCEF) IVPB 2g/100 mL premix     2 g 200 mL/hr over 30 Minutes Intravenous  Once 03/21/18 2304 03/22/18 1202    .  She was given sequential compression devices, early ambulation, and  Aspirin for DVT prophylaxis.  She benefited maximally from the hospital stay and there were no complications.    Recent vital signs:  Vitals:   03/24/18 1642 03/25/18 0700  BP: 102/71 113/72  Pulse: 96 (!) 105  Resp: 18   Temp: 97.8 F (36.6 C) 98 F (36.7 C)  SpO2: 96% 95%    Recent laboratory studies:  Lab Results  Component Value Date   HGB 12.6 03/25/2018   HGB 13.0 03/24/2018   HGB 12.5 03/23/2018   Lab Results  Component Value Date   WBC 9.2 03/25/2018   PLT 192 03/25/2018   Lab Results  Component Value Date   INR 0.94 03/17/2018   Lab Results  Component Value Date   NA 136 03/25/2018   K 3.4 (L) 03/25/2018   CL 102 03/25/2018   CO2 26 03/25/2018   BUN 12 03/25/2018   CREATININE 0.47 03/25/2018   GLUCOSE 173 (H) 03/25/2018    Discharge Medications:  Allergies as of 03/25/2018   No Known Allergies     Medication List    TAKE these medications   aspirin 325 MG EC tablet Take 1 tablet (325 mg total) by mouth daily with breakfast. Start taking on:  03/26/2018   docusate sodium 100 MG capsule Commonly known as:  COLACE Take 1 capsule (100 mg total) by mouth 2 (two) times daily.   glimepiride 4 MG tablet Commonly known as:  AMARYL Take 4 mg by mouth daily with breakfast.   HYDROcodone-acetaminophen 5-325 MG tablet Commonly known as:  NORCO/VICODIN Take 1-2 tablets by mouth every 4 (four) hours as needed for moderate pain (pain score 4-6).   JARDIANCE 25 MG Tabs tablet Generic drug:  empagliflozin Take 25 mg by  mouth daily.   metFORMIN 500 MG 24 hr tablet Commonly known as:  GLUCOPHAGE-XR Take 500 mg by mouth 2 (two) times daily. At lunch and at bedtime   methocarbamol 500 MG tablet Commonly known as:  ROBAXIN Take 1 tablet (500 mg total) by mouth every 6 (six) hours as needed for muscle spasms.   triamcinolone ointment 0.1 % Commonly known as:  KENALOG Apply 1 application topically 2 (two) times daily.            Durable Medical Equipment  (From admission, onward)        Start     Ordered   03/22/18 1442  DME Walker rolling  Once    Question:  Patient needs a walker to treat with the following condition  Answer:  Status post total knee replacement using cement, right   03/22/18 1441   03/22/18 1442  DME 3 n 1  Once     03/22/18 1441   03/22/18 1442  DME Bedside commode  Once    Question:  Patient needs a bedside commode to treat with the following condition  Answer:  Status post total knee replacement using cement, right   03/22/18 1441      Diagnostic Studies: Dg Knee 1-2 Views Right  Result Date: 03/22/2018 CLINICAL DATA:  [age] year old female post right knee replacement. EXAM: RIGHT KNEE - 1-2 VIEW COMPARISON:  02/18/2018 CT. FINDINGS: Post total right knee replacement which appears in satisfactory position without complication noted. IMPRESSION: Post total right knee replacement. Electronically Signed   By: Lacy DuverneySteven  Olson M.D.   On: 03/22/2018 14:19   Dg Chest Port 1 View  Result Date: 03/24/2018 CLINICAL DATA:  Total knee replacement. EXAM: PORTABLE CHEST 1 VIEW COMPARISON:  11/13/2013. FINDINGS: Mediastinum and hilar structures normal. Cardiomegaly with mild pulmonary vascular prominence. Mild bilateral interstitial prominence. Small left pleural effusion. No pneumothorax. IMPRESSION: 1.  Cardiomegaly with mild pulmonary venous congestion. 2. Mild basilar interstitial prominence. Small left pleural effusion. Electronically Signed   By: Maisie Fushomas  Register   On: 03/24/2018 09:30     Disposition:     Follow-up Information    Evon SlackGaines, Thomas C, PA-C Follow up in 2 week(s).   Specialties:  Orthopedic Surgery, Emergency Medicine Contact information: [address] [place] [address] [phone]            Signed: Patience MuscaGAINES, THOMAS CHRISTOPHER 03/25/2018, 1:16 PM

## 2018-03-25 NOTE — Discharge Instructions (Signed)

## 2018-03-25 NOTE — Progress Notes (Addendum)
Physical Therapy Treatment Patient Details Name: Tammy Brewer MRN: [id number] DOB: [birth year] Today's Date: 03/25/2018    History of Present Illness Patient is 89 female s/p R TKA 03/22/18, WBAT. PMH of anxiety, diabetes II, Bell's Palsy     PT Comments    Patient agreeable to PT at start of session, states that her pain is high this morning and she is not sure she would be able to perform stairs. Patient still needs minA for therapeutic exercises due to pain, as well as bed mobility, transferred with CGA multiple times during session, still needs verbal and visual cues for sequencing of movements. The patient was able to ambulate ~16950ft with RW and CGA, no incidences of R knee buckling though decreased speed. Patient also consented to stair training, able to perform 1 step with RW with step to pattern with CGA and verbal cues. Patient returned to room with all needs in reach, nursing notified of pain levels.  Medical interpretor utilized throughout session: Jacqui Laukaitis     Follow Up Recommendations  Home health PT;Supervision for mobility/OOB     Equipment Recommendations  Rolling walker with 5" wheels    Recommendations for Other Services       Precautions / Restrictions Precautions Precautions: Fall Restrictions Weight Bearing Restrictions: Yes RLE Weight Bearing: Weight bearing as tolerated    Mobility  Bed Mobility Overal bed mobility: Needs Assistance Bed Mobility: Supine to Sit     Supine to sit: Min assist     General bed mobility comments: Patient needs hand held assist for trunk elevation, assist with RLE movement  Transfers Overall transfer level: Needs assistance Equipment used: Rolling walker (2 wheeled) Transfers: Sit to/from Stand Sit to Stand: Min guard            Ambulation/Gait   Gait Distance (Feet): 150 Feet Assistive device: Rolling walker (2 wheeled) Gait Pattern/deviations: Step-to pattern;Step-through pattern     General Gait  Details: Decreased gait speed this AM, no incidences of knee buckling   Stairs Stairs: Yes Stairs assistance: Min guard Stair Management: With walker;Step to pattern Number of Stairs: 1     Wheelchair Mobility    Modified Rankin (Stroke Patients Only)       Balance Overall balance assessment: Needs assistance Sitting-balance support: Feet supported Sitting balance-Leahy Scale: Good     Standing balance support: Bilateral upper extremity supported Standing balance-Leahy Scale: Fair                              Cognition Arousal/Alertness: Awake/alert Behavior During Therapy: WFL for tasks assessed/performed Overall Cognitive Status: Within Functional Limits for tasks assessed                                        Exercises Total Joint Exercises Ankle Circles/Pumps: AROM;Both;20 reps Quad Sets: AROM;Both;20 reps Heel Slides: AAROM;Right;10 reps Straight Leg Raises: AAROM;Right;10 reps Goniometric ROM: R knee extension: 3, R knee flexion: 70, limited by pain    General Comments        Pertinent Vitals/Pain Pain Assessment: 0-10 Pain Score: 5  Pain Descriptors / Indicators: Sore Pain Intervention(s): Limited activity within patient's tolerance;Repositioned;Ice applied;Monitored during session    Home Living                      Prior Function  PT Goals (current goals can now be found in the care plan section) Progress towards PT goals: Progressing toward goals    Frequency    BID      PT Plan Current plan remains appropriate    Co-evaluation              AM-PAC PT "6 Clicks" Daily Activity  Outcome Measure  Difficulty turning over in bed (including adjusting bedclothes, sheets and blankets)?: None Difficulty moving from lying on back to sitting on the side of the bed? : Unable Difficulty sitting down on and standing up from a chair with arms (e.g., wheelchair, bedside commode, etc,.)?:  Unable Help needed moving to and from a bed to chair (including a wheelchair)?: A Little Help needed walking in hospital room?: A Little Help needed climbing 3-5 steps with a railing? : A Little 6 Click Score: 15    End of Session Equipment Utilized During Treatment: Gait belt Activity Tolerance: Patient limited by fatigue;Patient limited by pain;Patient tolerated treatment well Patient left: with chair alarm set;in chair;with family/visitor present;with SCD's reapplied(heels elevated) Nurse Communication: Mobility status;Patient requests pain meds PT Visit Diagnosis: Unsteadiness on feet (R26.81);Other abnormalities of gait and mobility (R26.89);Pain Pain - Right/Left: Right Pain - part of body: Knee     Time: 0915-0959 PT Time Calculation (min) (ACUTE ONLY): 44 min  Charges:  $Gait Training: 8-22 mins $Therapeutic Exercise: 8-22 mins $Therapeutic Activity: 8-22 mins   Olga Coaster PT, DPT 10:45 AM,03/25/18 [phone]

## 2018-03-25 NOTE — Progress Notes (Signed)
Pt ready for d/c home today after BM per MD. Pt just had large BM after suppository. Pt met PT goals, walker was delivered to pt's room and pt's son states they have BSC at home. Discharge instructions and prescriptions reviewed with pt and her son, all questions answered. PIV removed, VSS, slightly tachy, Dr. Rudene Christians notified. Stated ok to d/c.  [place], Jerry Caras

## 2018-06-15 ENCOUNTER — Other Ambulatory Visit: Payer: Self-pay | Admitting: Orthopedic Surgery

## 2018-06-15 DIAGNOSIS — M1712 Unilateral primary osteoarthritis, left knee: Secondary | ICD-10-CM

## 2018-06-23 ENCOUNTER — Ambulatory Visit: Payer: Managed Care, Other (non HMO)

## 2018-06-24 ENCOUNTER — Ambulatory Visit: Payer: Managed Care, Other (non HMO) | Attending: Orthopedic Surgery

## 2018-07-01 ENCOUNTER — Ambulatory Visit: Payer: Self-pay

## 2018-07-12 ENCOUNTER — Other Ambulatory Visit: Payer: Self-pay

## 2018-07-19 ENCOUNTER — Ambulatory Visit
Admission: RE | Admit: 2018-07-19 | Discharge: 2018-07-19 | Disposition: A | Discharge status: Home / Self Care | Payer: Managed Care, Other (non HMO) | Source: Ambulatory Visit | Attending: Orthopedic Surgery | Admitting: Orthopedic Surgery

## 2018-07-19 DIAGNOSIS — M1712 Unilateral primary osteoarthritis, left knee: Secondary | ICD-10-CM | POA: Diagnosis present

## 2018-08-09 ENCOUNTER — Other Ambulatory Visit: Payer: Self-pay

## 2018-08-09 ENCOUNTER — Encounter
Admission: RE | Admit: 2018-08-09 | Discharge: 2018-08-09 | Disposition: A | Discharge status: Home / Self Care | Payer: Managed Care, Other (non HMO) | Source: Ambulatory Visit | Attending: Orthopedic Surgery | Admitting: Orthopedic Surgery

## 2018-08-09 DIAGNOSIS — E119 Type 2 diabetes mellitus without complications: Secondary | ICD-10-CM | POA: Diagnosis not present

## 2018-08-09 DIAGNOSIS — M1712 Unilateral primary osteoarthritis, left knee: Secondary | ICD-10-CM | POA: Insufficient documentation

## 2018-08-09 DIAGNOSIS — Z01818 Encounter for other preprocedural examination: Secondary | ICD-10-CM | POA: Diagnosis present

## 2018-08-09 LAB — CBC
HCT: 42.7 % (ref 36.0–46.0)
Hemoglobin: 13.7 g/dL (ref 12.0–15.0)
MCH: 30.6 pg (ref 26.0–34.0)
MCHC: 32.1 g/dL (ref 30.0–36.0)
MCV: 95.5 fL (ref 80.0–100.0)
Platelets: 269 10*3/uL (ref 150–400)
RBC: 4.47 MIL/uL (ref 3.87–5.11)
RDW: 13.2 % (ref 11.5–15.5)
WBC: 5.9 10*3/uL (ref 4.0–10.5)
nRBC: 0 % (ref 0.0–0.2)

## 2018-08-09 LAB — BASIC METABOLIC PANEL
Anion gap: 8 (ref 5–15)
BUN: 18 mg/dL (ref 8–23)
CO2: 25 mmol/L (ref 22–32)
Calcium: 9.5 mg/dL (ref 8.9–10.3)
Chloride: 104 mmol/L (ref 98–111)
Creatinine, Ser: 0.37 mg/dL — ABNORMAL LOW (ref 0.44–1.00)
GFR calc Af Amer: >60 mL/min (ref >60)
GFR calc non Af Amer: >60 mL/min (ref >60)
Glucose, Bld: 292 mg/dL — ABNORMAL HIGH (ref 70–99)
Potassium: 3.6 mmol/L (ref 3.5–5.1)
Sodium: 137 mmol/L (ref 135–145)

## 2018-08-09 LAB — URINALYSIS, ROUTINE W REFLEX MICROSCOPIC
Bacteria, UA: NONE SEEN
Bilirubin Urine: NEGATIVE
Glucose, UA: >=500 mg/dL — AB
Hgb urine dipstick: NEGATIVE
Ketones, ur: NEGATIVE mg/dL
Leukocytes, UA: NEGATIVE
Nitrite: NEGATIVE
Protein, ur: NEGATIVE mg/dL
Specific Gravity, Urine: 1.015 (ref 1.005–1.030)
pH: 6 (ref 5.0–8.0)

## 2018-08-09 LAB — SEDIMENTATION RATE: Sed Rate: 14 mm/hr (ref 0–30)

## 2018-08-09 LAB — TYPE AND SCREEN
ABO/RH(D): O POS
Antibody Screen: NEGATIVE

## 2018-08-09 LAB — PROTIME-INR
INR: 0.88
Prothrombin Time: 11.9 seconds (ref 11.4–15.2)

## 2018-08-09 LAB — SURGICAL PCR SCREEN
MRSA, PCR: NEGATIVE
Staphylococcus aureus: NEGATIVE

## 2018-08-09 NOTE — Patient Instructions (Signed)
Your procedure is scheduled on: Tuesday 08/23/18 Su procedimiento est programado para: Report to 2nd floor surgery desk, [facility] entrance. Presntese a: To find out your arrival time please call [phone] between 1PM - 3PM on Monday 08/22/18. Para saber su hora de llegada por favor llame al [phone] entre la 1PM - 3PM el da:  Remember: Instructions that are not followed completely may result in serious medical risk, up to and including death, or upon the discretion of your surgeon and anesthesiologist your surgery may need to be rescheduled.  Recuerde: Las instrucciones que no se siguen completamente Armed forces logistics/support/administrative officerpueden resultar en un riesgo de salud grave, incluyendo hasta la [place] o a discrecin de su cirujano y Scientific laboratory techniciananestesilogo, su ciruga se puede posponer.   __X__ 1. Do not eat food  midnight. No gum chewing or hard candies.  No coma alimentos ni tome lquidos despus de la medianoche.  No mastique chicle ni caramelos  Duros. Clear liqiuds (water) ok up to 2 hours before arrival for surgery     __X__ 2. No alcohol for 24 hours before or after surgery.    No tome alcohol durante las 24 horas antes ni despus de la [place].   __X__ 3. Bring all medications with you on the day of surgery if instructed.    Lleve todos los medicamentos con usted el da de su ciruga si se le ha indicado as.   __X__ 4. Notify your doctor if there is any change in your medical condition (cold, fever,                             infections).    Informe a su mdico si hay algn cambio en su condicin mdica (resfriado, fiebre, infecciones).   Do not wear jewelry, make-up, hairpins, clips or nail polish.  No use joyas, maquillajes, pinzas/ganchos para el cabello ni esmalte de uas.  Do not wear lotions, powders, or perfumes. .  No use lociones, polvos o perfumes.  .    Do not shave 48 hours prior to surgery. Men may shave face and neck.  No se afeite 48 horas antes de la [place].  Los hombres pueden  Commercial Metals Companyafeitarse la cara y el cuello.   Do not bring valuables to the hospital.   No lleve objetos de valor al hospital.  [facility] is not responsible for any belongings or valuables.  [place] no se hace responsable de ningn tipo de pertenencias u objetos de Licensed conveyancervalor.               Contacts, dentures or bridgework may not be worn into surgery.  Los lentes de Saffordcontacto, las dentaduras postizas o puentes no se pueden usar en la [place].  Leave your suitcase in the car. After surgery it may be brought to your room.  Deje su maleta en el auto.  Despus de la ciruga podr traerla a su habitacin.  For patients admitted to the hospital, discharge time is determined by your treatment team.  Para los pacientes que sean ingresados al hospital, el tiempo en el cual se le dar de alta es determinado por su                equipo de [place].   Patients discharged the day of surgery will not be allowed to drive home. A los pacientes que se les da de alta el mismo da de la ciruga no se les permitir conducir a Higher education careers advisercasa.  Please read over the following fact sheets that you were given: Por favor lea las siguientes hojas de informacin que le dieron:   MRSA Information   __x__ Take these medicines the morning of surgery with A SIP OF WATER:          [place] medicinas la maana de la ciruga con UN SORBO DE AGUA:  1. NONE  2.   3.   4.       5.  6.  ____ Fleet Enema (as directed)          Enema de Fleet (segn lo indicado)    __X__ Use CHG Soap as directed          Utilice el jabn de CHG segn lo indicado  ____ Use inhalers on the day of surgery          Use los inhaladores el da de la ciruga  __X__ Stop metformin 2 days prior to surgery          Deje de tomar el metformin 2 das antes de la ciruga    ____ Take 1/2 of usual insulin dose the night before surgery and none on the morning of surgery           Tome la mitad de la dosis habitual de insulina la noche antes de la [place] y no  tome nada en la maana de la             ciruga  ____ Stop Coumadin/Plavix/aspirin on           Deje de tomar el Coumadin/Plavix/aspirina el da:  __X__ Stop Anti-inflammatories on 08/16/18 (ASPIRIN, ADVIL, ALEVE, MOTRIN, IBUPROFEN)          Deje de tomar antiinflamatorios el da:   __X__ Stop supplements until after surgery  08/16/18 STOP MANZANILLA TEA          Deje de tomar suplementos hasta despus de la ciruga  ____ Bring C-Pap to the hospital          Lleve el C-Pap al hospital

## 2018-08-10 LAB — URINE CULTURE

## 2018-08-22 MED ORDER — CEFAZOLIN SODIUM-DEXTROSE 2-4 GM/100ML-% IV SOLN
2.0000 g | Freq: Once | INTRAVENOUS | Status: AC
  Administered 2018-08-23: 2 g via INTRAVENOUS

## 2018-08-23 ENCOUNTER — Inpatient Hospital Stay
Admission: RE | Admit: 2018-08-23 | Discharge: 2018-08-26 | DRG: 470 | Disposition: A | Discharge status: Home Health | Payer: Managed Care, Other (non HMO) | Source: Ambulatory Visit | Attending: Orthopedic Surgery | Admitting: Orthopedic Surgery

## 2018-08-23 ENCOUNTER — Inpatient Hospital Stay: Payer: Managed Care, Other (non HMO) | Admitting: Certified Registered"

## 2018-08-23 ENCOUNTER — Encounter: Payer: Self-pay | Admitting: *Deleted

## 2018-08-23 ENCOUNTER — Other Ambulatory Visit: Payer: Self-pay

## 2018-08-23 ENCOUNTER — Encounter
Admission: RE | Disposition: A | Discharge status: Home Health | Payer: Self-pay | Source: Ambulatory Visit | Attending: Orthopedic Surgery

## 2018-08-23 ENCOUNTER — Inpatient Hospital Stay: Payer: Managed Care, Other (non HMO)

## 2018-08-23 DIAGNOSIS — Z96651 Presence of right artificial knee joint: Secondary | ICD-10-CM | POA: Diagnosis present

## 2018-08-23 DIAGNOSIS — Z7982 Long term (current) use of aspirin: Secondary | ICD-10-CM

## 2018-08-23 DIAGNOSIS — Z96652 Presence of left artificial knee joint: Secondary | ICD-10-CM

## 2018-08-23 DIAGNOSIS — Z886 Allergy status to analgesic agent status: Secondary | ICD-10-CM | POA: Diagnosis not present

## 2018-08-23 DIAGNOSIS — K5901 Slow transit constipation: Secondary | ICD-10-CM | POA: Diagnosis present

## 2018-08-23 DIAGNOSIS — Z7984 Long term (current) use of oral hypoglycemic drugs: Secondary | ICD-10-CM

## 2018-08-23 DIAGNOSIS — E7800 Pure hypercholesterolemia, unspecified: Secondary | ICD-10-CM | POA: Diagnosis present

## 2018-08-23 DIAGNOSIS — E119 Type 2 diabetes mellitus without complications: Secondary | ICD-10-CM | POA: Diagnosis present

## 2018-08-23 DIAGNOSIS — G8918 Other acute postprocedural pain: Secondary | ICD-10-CM

## 2018-08-23 DIAGNOSIS — M1712 Unilateral primary osteoarthritis, left knee: Secondary | ICD-10-CM | POA: Diagnosis present

## 2018-08-23 DIAGNOSIS — G5730 Lesion of lateral popliteal nerve, unspecified lower limb: Secondary | ICD-10-CM | POA: Diagnosis present

## 2018-08-23 DIAGNOSIS — Z9049 Acquired absence of other specified parts of digestive tract: Secondary | ICD-10-CM

## 2018-08-23 DIAGNOSIS — M21372 Foot drop, left foot: Secondary | ICD-10-CM | POA: Diagnosis present

## 2018-08-23 DIAGNOSIS — Z833 Family history of diabetes mellitus: Secondary | ICD-10-CM

## 2018-08-23 DIAGNOSIS — Z800 Family history of malignant neoplasm of digestive organs: Secondary | ICD-10-CM | POA: Diagnosis not present

## 2018-08-23 DIAGNOSIS — M25562 Pain in left knee: Secondary | ICD-10-CM | POA: Diagnosis present

## 2018-08-23 HISTORY — PX: TOTAL KNEE ARTHROPLASTY: SHX125

## 2018-08-23 LAB — GLUCOSE, CAPILLARY
Glucose-Capillary: 193 mg/dL — ABNORMAL HIGH (ref 70–99)
Glucose-Capillary: 158 mg/dL — ABNORMAL HIGH (ref 70–99)
Glucose-Capillary: 149 mg/dL — ABNORMAL HIGH (ref 70–99)
Glucose-Capillary: 140 mg/dL — ABNORMAL HIGH (ref 70–99)

## 2018-08-23 SURGERY — ARTHROPLASTY, KNEE, TOTAL
Anesthesia: Spinal | Site: Knee | Laterality: Left

## 2018-08-23 MED ORDER — PHENOL 1.4 % MT LIQD
1.0000 | OROMUCOSAL | Status: DC | PRN
  Filled 2018-08-23: qty 177

## 2018-08-23 MED ORDER — ONDANSETRON HCL 4 MG/2ML IJ SOLN: 4.0000 mg | Freq: Four times a day (QID) | INTRAMUSCULAR | Status: DC | PRN

## 2018-08-23 MED ORDER — OXYCODONE HCL 5 MG PO TABS
10.0000 mg | ORAL_TABLET | ORAL | Status: DC | PRN
  Administered 2018-08-24 (×3): 10 mg via ORAL
  Administered 2018-08-25 – 2018-08-26 (×6): 15 mg via ORAL
  Filled 2018-08-23: qty 3
  Filled 2018-08-23: qty 2
  Filled 2018-08-23: qty 3
  Filled 2018-08-23: qty 2
  Filled 2018-08-23 (×4): qty 3

## 2018-08-23 MED ORDER — FENTANYL CITRATE (PF) 100 MCG/2ML IJ SOLN
INTRAMUSCULAR | Status: DC | PRN
  Administered 2018-08-23 (×2): 50 ug via INTRAVENOUS

## 2018-08-23 MED ORDER — FENTANYL CITRATE (PF) 100 MCG/2ML IJ SOLN
INTRAMUSCULAR | Status: AC
  Filled 2018-08-23: qty 2

## 2018-08-23 MED ORDER — TRANEXAMIC ACID 1000 MG/10ML IV SOLN
INTRAVENOUS | Status: AC
  Filled 2018-08-23: qty 10

## 2018-08-23 MED ORDER — SODIUM CHLORIDE 0.9 % IV SOLN
INTRAVENOUS | Status: DC | PRN
  Administered 2018-08-23: 60 mL

## 2018-08-23 MED ORDER — PROMETHAZINE HCL 25 MG/ML IJ SOLN: 6.2500 mg | INTRAMUSCULAR | Status: DC | PRN

## 2018-08-23 MED ORDER — TRAMADOL HCL 50 MG PO TABS
50.0000 mg | ORAL_TABLET | Freq: Four times a day (QID) | ORAL | Status: DC
  Administered 2018-08-23 – 2018-08-26 (×10): 50 mg via ORAL
  Filled 2018-08-23 (×10): qty 1

## 2018-08-23 MED ORDER — NEOMYCIN-POLYMYXIN B GU 40-200000 IR SOLN
Status: AC
  Filled 2018-08-23: qty 4

## 2018-08-23 MED ORDER — METOCLOPRAMIDE HCL 5 MG/ML IJ SOLN: 5.0000 mg | Freq: Three times a day (TID) | INTRAMUSCULAR | Status: DC | PRN

## 2018-08-23 MED ORDER — PROPOFOL 500 MG/50ML IV EMUL
INTRAVENOUS | Status: AC
  Filled 2018-08-23: qty 50

## 2018-08-23 MED ORDER — EPHEDRINE SULFATE 50 MG/ML IJ SOLN
INTRAMUSCULAR | Status: AC
  Filled 2018-08-23: qty 1

## 2018-08-23 MED ORDER — FENTANYL CITRATE (PF) 100 MCG/2ML IJ SOLN: 25.0000 ug | INTRAMUSCULAR | Status: DC | PRN

## 2018-08-23 MED ORDER — SODIUM CHLORIDE 0.9 % IV SOLN
INTRAVENOUS | Status: DC
  Administered 2018-08-23 – 2018-08-24 (×2): via INTRAVENOUS

## 2018-08-23 MED ORDER — CEFAZOLIN SODIUM-DEXTROSE 2-4 GM/100ML-% IV SOLN
INTRAVENOUS | Status: AC
  Filled 2018-08-23: qty 100

## 2018-08-23 MED ORDER — ONDANSETRON HCL 4 MG PO TABS: 4.0000 mg | ORAL_TABLET | Freq: Four times a day (QID) | ORAL | Status: DC | PRN

## 2018-08-23 MED ORDER — METHOCARBAMOL 1000 MG/10ML IJ SOLN
500.0000 mg | Freq: Four times a day (QID) | INTRAVENOUS | Status: DC | PRN
  Filled 2018-08-23: qty 5

## 2018-08-23 MED ORDER — GLIMEPIRIDE 4 MG PO TABS
4.0000 mg | ORAL_TABLET | Freq: Every day | ORAL | Status: DC
  Administered 2018-08-24 – 2018-08-26 (×3): 4 mg via ORAL
  Filled 2018-08-23 (×3): qty 1

## 2018-08-23 MED ORDER — MIDAZOLAM HCL 2 MG/2ML IJ SOLN
INTRAMUSCULAR | Status: AC
  Filled 2018-08-23: qty 2

## 2018-08-23 MED ORDER — DOCUSATE SODIUM 100 MG PO CAPS
100.0000 mg | ORAL_CAPSULE | Freq: Two times a day (BID) | ORAL | Status: DC
  Administered 2018-08-23 – 2018-08-26 (×6): 100 mg via ORAL
  Filled 2018-08-23 (×5): qty 1

## 2018-08-23 MED ORDER — DIPHENHYDRAMINE HCL 12.5 MG/5ML PO ELIX: 12.5000 mg | ORAL_SOLUTION | ORAL | Status: DC | PRN

## 2018-08-23 MED ORDER — PHENYLEPHRINE HCL 10 MG/ML IJ SOLN
INTRAMUSCULAR | Status: DC | PRN
  Administered 2018-08-23 (×4): 100 ug via INTRAVENOUS

## 2018-08-23 MED ORDER — ZOLPIDEM TARTRATE 5 MG PO TABS: 5.0000 mg | ORAL_TABLET | Freq: Every evening | ORAL | Status: DC | PRN

## 2018-08-23 MED ORDER — CANAGLIFLOZIN 100 MG PO TABS: 100.0000 mg | ORAL_TABLET | Freq: Every day | ORAL | Status: DC

## 2018-08-23 MED ORDER — CEFAZOLIN SODIUM-DEXTROSE 2-4 GM/100ML-% IV SOLN
2.0000 g | Freq: Four times a day (QID) | INTRAVENOUS | Status: AC
  Administered 2018-08-23 (×2): 2 g via INTRAVENOUS
  Filled 2018-08-23 (×2): qty 100

## 2018-08-23 MED ORDER — MENTHOL 3 MG MT LOZG
1.0000 | LOZENGE | OROMUCOSAL | Status: DC | PRN
  Filled 2018-08-23: qty 9

## 2018-08-23 MED ORDER — ROSUVASTATIN CALCIUM 10 MG PO TABS
5.0000 mg | ORAL_TABLET | Freq: Every day | ORAL | Status: DC
  Administered 2018-08-23 – 2018-08-25 (×3): 5 mg via ORAL
  Filled 2018-08-23 (×3): qty 1

## 2018-08-23 MED ORDER — ASPIRIN EC 325 MG PO TBEC
325.0000 mg | DELAYED_RELEASE_TABLET | Freq: Every day | ORAL | Status: DC
  Administered 2018-08-24 – 2018-08-26 (×3): 325 mg via ORAL
  Filled 2018-08-23 (×3): qty 1

## 2018-08-23 MED ORDER — GLYCOPYRROLATE 0.2 MG/ML IJ SOLN
INTRAMUSCULAR | Status: DC | PRN
  Administered 2018-08-23: 0.2 mg via INTRAVENOUS

## 2018-08-23 MED ORDER — HYDROMORPHONE HCL 1 MG/ML IJ SOLN: 0.5000 mg | INTRAMUSCULAR | Status: DC | PRN

## 2018-08-23 MED ORDER — MAGNESIUM HYDROXIDE 400 MG/5ML PO SUSP
30.0000 mL | Freq: Every day | ORAL | Status: DC | PRN
  Administered 2018-08-25: 30 mL via ORAL
  Filled 2018-08-23: qty 30

## 2018-08-23 MED ORDER — METHOCARBAMOL 500 MG PO TABS: 500.0000 mg | ORAL_TABLET | Freq: Four times a day (QID) | ORAL | Status: DC | PRN

## 2018-08-23 MED ORDER — EPHEDRINE SULFATE 50 MG/ML IJ SOLN
INTRAMUSCULAR | Status: DC | PRN
  Administered 2018-08-23: 10 mg via INTRAVENOUS

## 2018-08-23 MED ORDER — SODIUM CHLORIDE 0.9 % IV SOLN
INTRAVENOUS | Status: DC
  Administered 2018-08-23 (×2): via INTRAVENOUS

## 2018-08-23 MED ORDER — SODIUM CHLORIDE (PF) 0.9 % IJ SOLN
INTRAMUSCULAR | Status: AC
  Filled 2018-08-23: qty 50

## 2018-08-23 MED ORDER — INSULIN ASPART 100 UNIT/ML ~~LOC~~ SOLN
0.0000 [IU] | Freq: Three times a day (TID) | SUBCUTANEOUS | Status: DC
  Administered 2018-08-23 – 2018-08-24 (×3): 2 [IU] via SUBCUTANEOUS
  Administered 2018-08-24: 3 [IU] via SUBCUTANEOUS
  Administered 2018-08-25: 2 [IU] via SUBCUTANEOUS
  Administered 2018-08-25: 5 [IU] via SUBCUTANEOUS
  Administered 2018-08-26: 3 [IU] via SUBCUTANEOUS
  Filled 2018-08-23 (×7): qty 1

## 2018-08-23 MED ORDER — SODIUM CHLORIDE 0.9 % IV SOLN
INTRAVENOUS | Status: DC | PRN
  Administered 2018-08-23: 500 mL

## 2018-08-23 MED ORDER — MORPHINE SULFATE (PF) 10 MG/ML IV SOLN
INTRAVENOUS | Status: AC
  Filled 2018-08-23: qty 1

## 2018-08-23 MED ORDER — OXYCODONE HCL 5 MG PO TABS
5.0000 mg | ORAL_TABLET | ORAL | Status: DC | PRN
  Administered 2018-08-23: 5 mg via ORAL
  Filled 2018-08-23: qty 2
  Filled 2018-08-23: qty 1

## 2018-08-23 MED ORDER — BUPIVACAINE HCL (PF) 0.5 % IJ SOLN
INTRAMUSCULAR | Status: AC
  Filled 2018-08-23: qty 10

## 2018-08-23 MED ORDER — TRANEXAMIC ACID-NACL 1000-0.7 MG/100ML-% IV SOLN
INTRAVENOUS | Status: DC | PRN
  Administered 2018-08-23: 1000 mg via INTRAVENOUS

## 2018-08-23 MED ORDER — ACETAMINOPHEN 325 MG PO TABS
325.0000 mg | ORAL_TABLET | Freq: Four times a day (QID) | ORAL | Status: DC | PRN
  Administered 2018-08-25: 650 mg via ORAL
  Filled 2018-08-23: qty 2

## 2018-08-23 MED ORDER — KETOROLAC TROMETHAMINE 30 MG/ML IJ SOLN
INTRAMUSCULAR | Status: DC | PRN
  Administered 2018-08-23: 30 mg

## 2018-08-23 MED ORDER — PROPOFOL 500 MG/50ML IV EMUL
INTRAVENOUS | Status: DC | PRN
  Administered 2018-08-23: 50 ug/kg/min via INTRAVENOUS

## 2018-08-23 MED ORDER — ALUM & MAG HYDROXIDE-SIMETH 200-200-20 MG/5ML PO SUSP: 30.0000 mL | ORAL | Status: DC | PRN

## 2018-08-23 MED ORDER — MAGNESIUM CITRATE PO SOLN
1.0000 | Freq: Once | ORAL | Status: AC | PRN
  Administered 2018-08-26: 0.75 via ORAL
  Filled 2018-08-23 (×2): qty 296

## 2018-08-23 MED ORDER — METFORMIN HCL ER 500 MG PO TB24
500.0000 mg | ORAL_TABLET | Freq: Two times a day (BID) | ORAL | Status: DC
  Administered 2018-08-23 – 2018-08-25 (×5): 500 mg via ORAL
  Filled 2018-08-23 (×7): qty 1

## 2018-08-23 MED ORDER — BUPIVACAINE-EPINEPHRINE (PF) 0.25% -1:200000 IJ SOLN
INTRAMUSCULAR | Status: DC | PRN
  Administered 2018-08-23: 30 mL

## 2018-08-23 MED ORDER — FAMOTIDINE 20 MG PO TABS
20.0000 mg | ORAL_TABLET | Freq: Once | ORAL | Status: AC
  Administered 2018-08-23: 20 mg via ORAL

## 2018-08-23 MED ORDER — TRIAMCINOLONE ACETONIDE 0.1 % EX OINT
1.0000 "application " | TOPICAL_OINTMENT | Freq: Two times a day (BID) | CUTANEOUS | Status: DC
  Administered 2018-08-23 – 2018-08-26 (×6): 1 via TOPICAL
  Filled 2018-08-23: qty 15

## 2018-08-23 MED ORDER — BISACODYL 5 MG PO TBEC
5.0000 mg | DELAYED_RELEASE_TABLET | Freq: Every day | ORAL | Status: DC | PRN
  Administered 2018-08-25: 5 mg via ORAL
  Filled 2018-08-23 (×2): qty 1

## 2018-08-23 MED ORDER — ACETAMINOPHEN 500 MG PO TABS
1000.0000 mg | ORAL_TABLET | Freq: Four times a day (QID) | ORAL | Status: AC
  Administered 2018-08-23 – 2018-08-24 (×3): 1000 mg via ORAL
  Filled 2018-08-23 (×3): qty 2

## 2018-08-23 MED ORDER — GLYCOPYRROLATE 0.2 MG/ML IJ SOLN
INTRAMUSCULAR | Status: AC
  Filled 2018-08-23: qty 1

## 2018-08-23 MED ORDER — MIDAZOLAM HCL 5 MG/5ML IJ SOLN
INTRAMUSCULAR | Status: DC | PRN
  Administered 2018-08-23: 2 mg via INTRAVENOUS

## 2018-08-23 MED ORDER — GENTAMICIN SULFATE 40 MG/ML IJ SOLN
INTRAMUSCULAR | Status: AC
  Filled 2018-08-23: qty 2

## 2018-08-23 MED ORDER — SODIUM CHLORIDE FLUSH 0.9 % IV SOLN
INTRAVENOUS | Status: AC
  Filled 2018-08-23: qty 10

## 2018-08-23 MED ORDER — BUPIVACAINE-EPINEPHRINE (PF) 0.25% -1:200000 IJ SOLN
INTRAMUSCULAR | Status: AC
  Filled 2018-08-23: qty 30

## 2018-08-23 MED ORDER — BUPIVACAINE LIPOSOME 1.3 % IJ SUSP
INTRAMUSCULAR | Status: AC
  Filled 2018-08-23: qty 20

## 2018-08-23 MED ORDER — METOCLOPRAMIDE HCL 10 MG PO TABS: 5.0000 mg | ORAL_TABLET | Freq: Three times a day (TID) | ORAL | Status: DC | PRN

## 2018-08-23 MED ORDER — PROPOFOL 10 MG/ML IV BOLUS
INTRAVENOUS | Status: DC | PRN
  Administered 2018-08-23: 20 mg via INTRAVENOUS

## 2018-08-23 MED ORDER — FAMOTIDINE 20 MG PO TABS
ORAL_TABLET | ORAL | Status: AC
  Administered 2018-08-23: 20 mg via ORAL
  Filled 2018-08-23: qty 1

## 2018-08-23 MED ORDER — BUPIVACAINE HCL (PF) 0.5 % IJ SOLN
INTRAMUSCULAR | Status: DC | PRN
  Administered 2018-08-23: 3 mL via INTRATHECAL

## 2018-08-23 MED ORDER — MORPHINE SULFATE 10 MG/ML IJ SOLN
INTRAMUSCULAR | Status: DC | PRN
  Administered 2018-08-23: 10 mg

## 2018-08-23 SURGICAL SUPPLY — 66 items
BANDAGE ACE 6X5 VEL STRL LF (GAUZE/BANDAGES/DRESSINGS) ×2 IMPLANT
BLADE SAW 1 (BLADE) ×2 IMPLANT
BLOCK CUTTING FEMUR 2+ LT MED (MISCELLANEOUS) ×2 IMPLANT
BLOCK CUTTING TIBIAL 4 (MISCELLANEOUS) ×2 IMPLANT
BLOCK CUTTING TIBIAL 4 MEDAC (MISCELLANEOUS) ×2 IMPLANT
CANISTER SUCT 1200ML W/VALVE (MISCELLANEOUS) ×2 IMPLANT
CANISTER SUCT 3000ML PPV (MISCELLANEOUS) ×4 IMPLANT
CEMENT HV SMART SET (Cement) ×4 IMPLANT
CEMENT PATELLA RESURF SZ1 (Cement) ×2 IMPLANT
CHLORAPREP W/TINT 26ML (MISCELLANEOUS) ×4 IMPLANT
COOLER POLAR GLACIER W/PUMP (MISCELLANEOUS) ×2 IMPLANT
COVER WAND RF STERILE (DRAPES) ×2 IMPLANT
CUFF TOURN 24 STER (MISCELLANEOUS) IMPLANT
CUFF TOURN 30 STER DUAL PORT (MISCELLANEOUS) IMPLANT
DRAPE SHEET LG 3/4 BI-LAMINATE (DRAPES) ×4 IMPLANT
ELECT CAUTERY BLADE 6.4 (BLADE) ×2 IMPLANT
ELECT REM PT RETURN 9FT ADLT (ELECTROSURGICAL) ×2
ELECTRODE REM PT RTRN 9FT ADLT (ELECTROSURGICAL) ×1 IMPLANT
FEMERAL COMPONENT LEFT SZ2P (Femur) ×2 IMPLANT
FEMUR BONE MODEL 4.9010 MEDACT (MISCELLANEOUS) ×2 IMPLANT
FEMUR CUTTING BLOCK ×2 IMPLANT
GAUZE PETRO XEROFOAM 1X8 (MISCELLANEOUS) ×2 IMPLANT
GAUZE SPONGE 4X4 12PLY STRL (GAUZE/BANDAGES/DRESSINGS) ×2 IMPLANT
GLOVE BIOGEL PI IND STRL 9 (GLOVE) ×1 IMPLANT
GLOVE BIOGEL PI INDICATOR 9 (GLOVE) ×1
GLOVE INDICATOR 8.0 STRL GRN (GLOVE) ×2 IMPLANT
GLOVE SURG ORTHO 8.0 STRL STRW (GLOVE) ×2 IMPLANT
GLOVE SURG SYN 9.0  PF PI (GLOVE) ×1
GLOVE SURG SYN 9.0 PF PI (GLOVE) ×1 IMPLANT
GOWN SRG 2XL LVL 4 RGLN SLV (GOWNS) ×1 IMPLANT
GOWN STRL NON-REIN 2XL LVL4 (GOWNS) ×1
GOWN STRL REUS W/ TWL LRG LVL3 (GOWN DISPOSABLE) ×1 IMPLANT
GOWN STRL REUS W/ TWL XL LVL3 (GOWN DISPOSABLE) ×1 IMPLANT
GOWN STRL REUS W/TWL LRG LVL3 (GOWN DISPOSABLE) ×1
GOWN STRL REUS W/TWL XL LVL3 (GOWN DISPOSABLE) ×1
HOLDER FOLEY CATH W/STRAP (MISCELLANEOUS) ×2 IMPLANT
HOOD PEEL AWAY FLYTE STAYCOOL (MISCELLANEOUS) ×4 IMPLANT
INSERT TIBIAL SZ2 LEFT (Insert) ×2 IMPLANT
KIT TURNOVER KIT A (KITS) ×2 IMPLANT
KNIFE SCULPS 14X20 (INSTRUMENTS) ×2 IMPLANT
NDL SAFETY ECLIPSE 18X1.5 (NEEDLE) ×1 IMPLANT
NEEDLE HYPO 18GX1.5 SHARP (NEEDLE) ×1
NEEDLE SPNL 18GX3.5 QUINCKE PK (NEEDLE) ×2 IMPLANT
NEEDLE SPNL 20GX3.5 QUINCKE YW (NEEDLE) ×2 IMPLANT
NS IRRIG 1000ML POUR BTL (IV SOLUTION) ×2 IMPLANT
PACK TOTAL KNEE (MISCELLANEOUS) ×2 IMPLANT
PAD WRAPON POLAR KNEE (MISCELLANEOUS) ×1 IMPLANT
PULSAVAC PLUS IRRIG FAN TIP (DISPOSABLE) ×2
SCALPEL PROTECTED #10 DISP (BLADE) ×4 IMPLANT
SOL .9 NS 3000ML IRR  AL (IV SOLUTION) ×1
SOL .9 NS 3000ML IRR UROMATIC (IV SOLUTION) ×1 IMPLANT
STAPLER SKIN PROX 35W (STAPLE) ×2 IMPLANT
STEM EXTENSION 11MMX30MM (Stem) ×2 IMPLANT
SUCTION FRAZIER HANDLE 10FR (MISCELLANEOUS) ×1
SUCTION TUBE FRAZIER 10FR DISP (MISCELLANEOUS) ×1 IMPLANT
SUT DVC 2 QUILL PDO  T11 36X36 (SUTURE) ×1
SUT DVC 2 QUILL PDO T11 36X36 (SUTURE) ×1 IMPLANT
SUT V-LOC 90 ABS DVC 3-0 CL (SUTURE) ×2 IMPLANT
SYR 20CC LL (SYRINGE) ×2 IMPLANT
SYR 50ML LL SCALE MARK (SYRINGE) ×4 IMPLANT
TIBIAL TRAY FIXED MEDACTA 0207 (Joint) ×2 IMPLANT
TIP FAN IRRIG PULSAVAC PLUS (DISPOSABLE) ×1 IMPLANT
TOWEL OR 17X26 4PK STRL BLUE (TOWEL DISPOSABLE) ×2 IMPLANT
TOWER CARTRIDGE SMART MIX (DISPOSABLE) ×2 IMPLANT
TRAY FOLEY MTR SLVR 16FR STAT (SET/KITS/TRAYS/PACK) ×2 IMPLANT
WRAPON POLAR PAD KNEE (MISCELLANEOUS) ×2

## 2018-08-23 NOTE — Anesthesia Post-op Follow-up Note (Signed)
Anesthesia QCDR form completed.        

## 2018-08-23 NOTE — NC FL2 (Signed)
[place] MEDICAID FL2 LEVEL OF CARE SCREENING TOOL     IDENTIFICATION  Patient Name: Tammy Brewer Birthdate: 1955-12-03 Sex: female Admission Date (Current Location): 08/23/2018  [place]   Facility and Address:  [facility], [address], [place], [place] [address]      Provider Number: [id number]  Attending Physician Name and Address:  Kennedy BuckerMenz, Michael, MD  Relative Name and Phone Number:       Current Level of Care: Hospital Recommended Level of Care: Skilled Nursing Facility Prior Approval Number:    Date Approved/Denied:   PASRR Number: [id number])  Discharge Plan: SNF    Current Diagnoses: Patient Active Problem List   Diagnosis Date Noted  . Status post total knee replacement using cement, 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  . Status post total knee replacement using cement, right 03/22/2018    Orientation RESPIRATION BLADDER Height & Weight     Self, Situation, Time, Place  Normal Continent Weight:   Height:     BEHAVIORAL SYMPTOMS/MOOD NEUROLOGICAL BOWEL NUTRITION STATUS      Continent Diet(Diet: Carb Modified. )  AMBULATORY STATUS COMMUNICATION OF NEEDS Skin   Extensive Assist Verbally Surgical wounds(Incision: Left Knee. )                       Personal Care Assistance Level of Assistance  Bathing, Feeding, Dressing Bathing Assistance: Limited assistance Feeding assistance: Independent Dressing Assistance: Limited assistance     Functional Limitations Info  Sight, Hearing, Speech Sight Info: Adequate Hearing Info: Adequate Speech Info: Adequate    SPECIAL CARE FACTORS FREQUENCY  PT (By licensed PT), OT (By licensed OT)     PT Frequency: (5) OT Frequency: (5)            Contractures      Additional Factors Info  Code Status, Allergies Code Status Info: (Full Code. ) Allergies Info: (No Known Allergies. )           Current Medications (08/23/2018):  This is the current hospital  active medication list Current Facility-Administered Medications  Medication Dose Route Frequency Provider Last Rate Last Dose  . 0.9 %  sodium chloride infusion   Intravenous Continuous Kennedy BuckerMenz, Michael, MD 75 mL/hr at 08/23/18 1315    . acetaminophen (TYLENOL) tablet 1,000 mg  1,000 mg Oral Q6H Kennedy BuckerMenz, Michael, MD      . Melene Muller[START ON 08/24/2018] acetaminophen (TYLENOL) tablet 325-650 mg  325-650 mg Oral Q6H PRN Kennedy BuckerMenz, Michael, MD      . alum & mag hydroxide-simeth (MAALOX/MYLANTA) 200-200-20 MG/5ML suspension 30 mL  30 mL Oral Q4H PRN Kennedy BuckerMenz, Michael, MD      . Melene Muller[START ON 08/24/2018] aspirin EC tablet 325 mg  325 mg Oral Q breakfast Kennedy BuckerMenz, Michael, MD      . bisacodyl (DULCOLAX) EC tablet 5 mg  5 mg Oral Daily PRN Kennedy BuckerMenz, Michael, MD      . ceFAZolin (ANCEF) 2-4 GM/100ML-% IVPB           . ceFAZolin (ANCEF) IVPB 2g/100 mL premix  2 g Intravenous Q6H Kennedy BuckerMenz, Michael, MD 200 mL/hr at 08/23/18 1650 2 g at 08/23/18 1650  . diphenhydrAMINE (BENADRYL) 12.5 MG/5ML elixir 12.5-25 mg  12.5-25 mg Oral Q4H PRN Kennedy BuckerMenz, Michael, MD      . docusate sodium (COLACE) capsule 100 mg  100 mg Oral BID Kennedy BuckerMenz, Michael, MD      . Melene Muller[START ON 08/24/2018] glimepiride (AMARYL) tablet 4 mg  4 mg  Oral Q breakfast Kennedy BuckerMenz, Michael, MD      . HYDROmorphone (DILAUDID) injection 0.5-1 mg  0.5-1 mg Intravenous Q4H PRN Kennedy BuckerMenz, Michael, MD      . insulin aspart (novoLOG) injection 0-15 Units  0-15 Units Subcutaneous TID WC Kennedy BuckerMenz, Michael, MD      . magnesium citrate solution 1 Bottle  1 Bottle Oral Once PRN Kennedy BuckerMenz, Michael, MD      . magnesium hydroxide (MILK OF MAGNESIA) suspension 30 mL  30 mL Oral Daily PRN Kennedy BuckerMenz, Michael, MD      . menthol-cetylpyridinium (CEPACOL) lozenge 3 mg  1 lozenge Oral PRN Kennedy BuckerMenz, Michael, MD       Or  . phenol (CHLORASEPTIC) mouth spray 1 spray  1 spray Mouth/Throat PRN Kennedy BuckerMenz, Michael, MD      . metFORMIN (GLUCOPHAGE-XR) 24 hr tablet 500 mg  500 mg Oral BID AC Kennedy BuckerMenz, Michael, MD      . methocarbamol (ROBAXIN) tablet 500 mg  500 mg Oral  Q6H PRN Kennedy BuckerMenz, Michael, MD       Or  . methocarbamol (ROBAXIN) 500 mg in dextrose 5 % 50 mL IVPB  500 mg Intravenous Q6H PRN Kennedy BuckerMenz, Michael, MD      . metoCLOPramide (REGLAN) tablet 5-10 mg  5-10 mg Oral Q8H PRN Kennedy BuckerMenz, Michael, MD       Or  . metoCLOPramide (REGLAN) injection 5-10 mg  5-10 mg Intravenous Q8H PRN Kennedy BuckerMenz, Michael, MD      . ondansetron [facility](ZOFRAN) tablet 4 mg  4 mg Oral Q6H PRN Kennedy BuckerMenz, Michael, MD       Or  . ondansetron [facility](ZOFRAN) injection 4 mg  4 mg Intravenous Q6H PRN Kennedy BuckerMenz, Michael, MD      . oxyCODONE (Oxy IR/ROXICODONE) immediate release tablet 10-15 mg  10-15 mg Oral Q4H PRN Kennedy BuckerMenz, Michael, MD      . oxyCODONE (Oxy IR/ROXICODONE) immediate release tablet 5-10 mg  5-10 mg Oral Q4H PRN Kennedy BuckerMenz, Michael, MD      . rosuvastatin (CRESTOR) tablet 5 mg  5 mg Oral QHS Kennedy BuckerMenz, Michael, MD      . traMADol Janean Sark(ULTRAM) tablet 50 mg  50 mg Oral Q6H Kennedy BuckerMenz, Michael, MD      . triamcinolone ointment (KENALOG) 0.1 % 1 application  1 application Topical BID Kennedy BuckerMenz, Michael, MD      . zolpidem (AMBIEN) tablet 5 mg  5 mg Oral QHS PRN Kennedy BuckerMenz, Michael, MD         Discharge Medications: Please see discharge summary for a list of discharge medications.  Relevant Imaging Results:  Relevant Lab Results:   Additional Information (SSN: [id number])  Meliyah Simon, Darleen CrockerBailey M, LCSW

## 2018-08-23 NOTE — Anesthesia Preprocedure Evaluation (Signed)
Anesthesia Evaluation  Patient identified by MRN, date of birth, ID band Patient awake    Reviewed: Allergy & Precautions, H&P , NPO status , Patient's Chart, lab work & pertinent test results  History of Anesthesia Complications Negative for: history of anesthetic complications  Airway Mallampati: III  TM Distance: <3 FB Neck ROM: limited    Dental  (+) Chipped, Poor Dentition, Missing, Partial Upper   Pulmonary neg pulmonary ROS, neg shortness of breath,           Cardiovascular Exercise Tolerance: Good (-) angina(-) Past MI and (-) DOE negative cardio ROS       Neuro/Psych Anxiety  Neuromuscular disease negative psych ROS   GI/Hepatic negative GI ROS, Neg liver ROS,   Endo/Other  diabetes, Type 2  Renal/GU      Musculoskeletal   Abdominal   Peds  Hematology negative hematology ROS (+)   Anesthesia Other Findings Past Medical History: No date: Anxiety No date: Bell's palsy No date: Diabetes mellitus without complication (HCC) No date: High cholesterol  Past Surgical History: No date: APPENDECTOMY No date: CHOLECYSTECTOMY No date: DILATION AND CURETTAGE OF UTERUS  BMI    Body Mass Index:  26.95 kg/m      Reproductive/Obstetrics negative OB ROS                             Anesthesia Physical  Anesthesia Plan  ASA: III  Anesthesia Plan: Spinal   Post-op Pain Management:    Induction:   PONV Risk Score and Plan:   Airway Management Planned: Natural Airway and Nasal Cannula  Additional Equipment:   Intra-op Plan:   Post-operative Plan:   Informed Consent: I have reviewed the patients History and Physical, chart, labs and discussed the procedure including the risks, benefits and alternatives for the proposed anesthesia with the patient or authorized representative who has indicated his/her understanding and acceptance.   Dental Advisory Given  Plan Discussed  with: Anesthesiologist, CRNA and Surgeon  Anesthesia Plan Comments: (Consent via interpreter   Patient reports no bleeding problems and no anticoagulant use.  Plan for spinal with backup GA  Patient consented for risks of anesthesia including but not limited to:  - adverse reactions to medications - risk of bleeding, infection, nerve damage and headache - risk of failed spinal - damage to teeth, lips or other oral mucosa - sore throat or hoarseness - Damage to heart, brain, lungs or loss of life  Patient voiced understanding.)        Anesthesia Quick Evaluation

## 2018-08-23 NOTE — Pre-Procedure Instructions (Signed)
Urine culture results sent to Dr. Menz for review. 

## 2018-08-23 NOTE — H&P (Signed)
Reviewed paper H+P, will be scanned into chart. No changes noted.  

## 2018-08-23 NOTE — Transfer of Care (Signed)
Immediate Anesthesia Transfer of Care Note  Patient: Tammy Brewer  Procedure(s) Performed: TOTAL KNEE ARTHROPLASTY (Left Knee)  Patient Location: PACU  Anesthesia Type:Spinal  Level of Consciousness: awake  Airway & Oxygen Therapy: Patient Spontanous Breathing  Post-op Assessment: Report given to RN and Post -op Vital signs reviewed and stable  Post vital signs: Reviewed  Last Vitals:  Vitals Value Taken Time  BP 101/55 08/23/2018 11:40 AM  Temp    Pulse 81 08/23/2018 11:40 AM  Resp 16 08/23/2018 11:40 AM  SpO2 99 % 08/23/2018 11:40 AM  Vitals shown include unvalidated device data.  Last Pain:  Vitals:   08/23/18 0842  TempSrc: Tympanic  PainSc: 0-No pain         Complications: No apparent anesthesia complications

## 2018-08-23 NOTE — Care Management (Deleted)
Patient was here in July and received a bedside commode and rolling walker. She also was discharged with [facility] for home health PT. She now has [employer] insurance and [facility] is out of network (along with many other home PT agencies).

## 2018-08-23 NOTE — Progress Notes (Signed)
PT Cancellation Note  Patient Details Name: Tammy Brewer MRN: [id number] DOB: [birth year]   Cancelled Treatment:    Reason Eval/Treat Not Completed: Patient not medically ready Pt in room post surgery.  At ~15:30 still unable to do any ankle DF and reports very minimal sensation.  Will hold POD0 exam and initiate PT tomorrow.    Malachi ProGalen R Rudie Rikard, DPT 08/23/2018, 3:39 PM

## 2018-08-23 NOTE — Op Note (Signed)
08/23/2018  11:41 AM  PATIENT:  Tammy Brewer  62 y.o. female  PRE-OPERATIVE DIAGNOSIS:  PRIMARY OSTEOARHTRITIS OF LEFT KNEE  POST-OPERATIVE DIAGNOSIS:  PRIMARY OSTEOARHTRITIS OF LEFT KNEE  PROCEDURE:  Procedure(s): TOTAL KNEE ARTHROPLASTY (Left)  SURGEON: Leitha SchullerMichael J Traci Plemons, MD  ASSISTANTS: Cranston Neighborhris Gaines, PA-C  ANESTHESIA:   spinal  EBL:  Total I/O In: 1000 [I.V.:1000] Out: 250 [Urine:200; Blood:50]  BLOOD ADMINISTERED:none  DRAINS: none   LOCAL MEDICATIONS USED:  MARCAINE    and OTHER Toradol morphine and Exparel  SPECIMEN:  No Specimen  DISPOSITION OF SPECIMEN:  N/A  COUNTS:  YES  TOURNIQUET: 56 minutes at 300 mmHg  IMPLANTS: Medacta GMK near 2+ femur, 2 tibia, 14 mm insert with size 1 patella, all components cemented  DICTATION: .Dragon Dictation  patient was brought to the operating room and after adequatespinalanesthesia was obtained theleft leg was prepped and draped in the usual sterile fashion. After patient identification and timeout procedure were completed, a midline skin incision was made followed by a medial parapatellar arthrotomy with a tourniquet raised after bone cuts were started. There is mildsynovitis throughout the knee with eburnated bone in themedial compartment and patellofemoral joint with advancedarthritis to thelateral compartment. The fat pad and ACL and PCL were excised. The proximal tibia was exposed and the proximal tibia MYKNEE  cutting guide applied and proximal tibia cut carried out. The distal femoral cut was carried out with theMYKNEE guideas well and adequate resection noted for the  extension gap. The size2+cutting guide was applied, anterior posterior and chamfer cuts made on the femur with the meniscus removed at this time. With the PCL completely  released themy kneecutting block applied to the tibia the proximal tibia was prepared for short stem. trials placed, 2+femur placed with a 13mm insert giving good stability  throughout a range of motion and full extension obtained without difficulty. The distal femoral drill holes were made with  cut for the trochlear groove made. These trials were removed and the patella was cut using the patellar cutting guide drill holes were made in the patella sized to a size 1. the bony surfaces were thoroughly irrigated and dried. The tibial component was cemented to place first with thepolyethylene was placed with set screw tightened with a torque screwdriver. The femoral component was placed with excess cement being removed and the knee held in extension as the patellar button was clamped into place. When the cement set the excess amount around the periphery was removed and the patella tracked well with no touch technique. The tourniquet was let down and the knee thoroughly irrigated. The wound was closed with a heavy Quill for the arthrotomy followed by a 3-0 v-LOC subcuticular closure followed by skin staples and Xeroform 4 x 4 ABD web roll Ace wrap andPolar Care   PLAN OF CARE: Admit to inpatient   PATIENT DISPOSITION:  PACU - hemodynamically stable.

## 2018-08-23 NOTE — Anesthesia Procedure Notes (Signed)
Spinal  Patient location during procedure: OR Staffing Anesthesiologist: Karenz, Andrew, MD Resident/CRNA: Leon Montoya, CRNA Performed: resident/CRNA  Preanesthetic Checklist Completed: patient identified, site marked, surgical consent, pre-op evaluation, timeout performed, IV checked, risks and benefits discussed and monitors and equipment checked Spinal Block Patient position: sitting Prep: ChloraPrep and site prepped and draped Patient monitoring: heart rate, continuous pulse ox, blood pressure and cardiac monitor Approach: midline Location: L4-5 Injection technique: single-shot Needle Needle type: Introducer and Pencan  Needle gauge: 24 G Needle length: 9 cm Additional Notes Negative paresthesia. Negative blood return. Positive free-flowing CSF. Expiration date of kit checked and confirmed. Patient tolerated procedure well, without complications.       

## 2018-08-23 NOTE — Progress Notes (Signed)
ADMISSION NOTE:  Pt admitted to room 154 from PACU. Pt alert and oriented X4, spanish speaking. Surgical dressing clean dry and intact, foley in place. Husband at bedside. Bed in lowest position call bell in reach bed alarm on.

## 2018-08-24 LAB — CBC
HCT: 35.7 % — ABNORMAL LOW (ref 36.0–46.0)
Hemoglobin: 11.6 g/dL — ABNORMAL LOW (ref 12.0–15.0)
MCH: 30.9 pg (ref 26.0–34.0)
MCHC: 32.5 g/dL (ref 30.0–36.0)
MCV: 95.2 fL (ref 80.0–100.0)
Platelets: 203 10*3/uL (ref 150–400)
RBC: 3.75 MIL/uL — ABNORMAL LOW (ref 3.87–5.11)
RDW: 13.2 % (ref 11.5–15.5)
WBC: 8.4 10*3/uL (ref 4.0–10.5)
nRBC: 0 % (ref 0.0–0.2)

## 2018-08-24 LAB — BASIC METABOLIC PANEL
Anion gap: 5 (ref 5–15)
BUN: 14 mg/dL (ref 8–23)
CO2: 22 mmol/L (ref 22–32)
Calcium: 8.2 mg/dL — ABNORMAL LOW (ref 8.9–10.3)
Chloride: 110 mmol/L (ref 98–111)
Creatinine, Ser: 0.38 mg/dL — ABNORMAL LOW (ref 0.44–1.00)
GFR calc Af Amer: >60 mL/min (ref >60)
GFR calc non Af Amer: >60 mL/min (ref >60)
Glucose, Bld: 160 mg/dL — ABNORMAL HIGH (ref 70–99)
Potassium: 3.5 mmol/L (ref 3.5–5.1)
Sodium: 137 mmol/L (ref 135–145)

## 2018-08-24 LAB — GLUCOSE, CAPILLARY
Glucose-Capillary: 181 mg/dL — ABNORMAL HIGH (ref 70–99)
Glucose-Capillary: 164 mg/dL — ABNORMAL HIGH (ref 70–99)
Glucose-Capillary: 137 mg/dL — ABNORMAL HIGH (ref 70–99)
Glucose-Capillary: 175 mg/dL — ABNORMAL HIGH (ref 70–99)

## 2018-08-24 NOTE — Evaluation (Signed)
Physical Therapy Evaluation Patient Details Name: Tammy Brewer MRN: [id number] DOB: 24-May-1956 Today's Date: 08/24/2018   History of Present Illness  Pt is a 63 y/o F s/p L TKA.  Pt with L foot drop and decreased sensation following surgery.  Per Dr. Ernest PineHooten on 1/1 plan is to keep L knee bent when resting.  Pt's PMH includes Bell's palsy.     Clinical Impression  Pt is s/p L TKA resulting in the deficits listed below (see PT Problem List). Pt puts forth good effort despite significant pain (pt tearful and crying during session).  L foot drop remains so compensatory techniques taught for ambulation.  Pt ambulated 35 ft with RW, remaining steady throughout. Pt will benefit from skilled PT to increase their independence and safety with mobility to allow discharge to the venue listed below.     Follow Up Recommendations Home health PT;Supervision for mobility/OOB    Equipment Recommendations  None recommended by PT    Recommendations for Other Services OT consult     Precautions / Restrictions Precautions Precautions: Fall;Other (comment) Precaution Comments: new L foot drop Restrictions Weight Bearing Restrictions: Yes LLE Weight Bearing: Weight bearing as tolerated      Mobility  Bed Mobility Overal bed mobility: Needs Assistance Bed Mobility: Supine to Sit     Supine to sit: HOB elevated;Min assist     General bed mobility comments: Assist to advance LLE to EOB.  Cues for sequencing.  Pt uses bed rail.   Transfers Overall transfer level: Needs assistance Equipment used: Rolling walker (2 wheeled) Transfers: Sit to/from Stand Sit to Stand: Min guard            Ambulation/Gait Ambulation/Gait assistance: Min guard Gait Distance (Feet): 35 Feet Assistive device: Rolling walker (2 wheeled) Gait Pattern/deviations: Step-to pattern;Decreased dorsiflexion - left;Decreased stance time - left;Decreased step length - right;Antalgic Gait velocity: decreased Gait velocity  interpretation: <1.31 ft/sec, indicative of household ambulator General Gait Details: Cues for compensatory L hip F due to L foot drop.  Pt remains steady using RW.  Min guard for safety as pt reports dizziness at end of ambulation.  BP taken in sitting by RN and WNL.   Stairs            Wheelchair Mobility    Modified Rankin (Stroke Patients Only)       Balance Overall balance assessment: Needs assistance Sitting-balance support: No upper extremity supported;Feet supported Sitting balance-Leahy Scale: Good     Standing balance support: Single extremity supported;During functional activity Standing balance-Leahy Scale: Poor Standing balance comment: Pt relies on at least 1UE support for static and dynamic activities                             Pertinent Vitals/Pain Pain Assessment: Faces Faces Pain Scale: Hurts worst Pain Location: L knee Pain Descriptors / Indicators: Crying;Grimacing;Guarding;Moaning Pain Intervention(s): Limited activity within patient's tolerance;Monitored during session;Premedicated before session;Repositioned;Ice applied;Utilized relaxation techniques    Home Living Family/patient expects to be discharged to:: Private residence Living Arrangements: Children(son who works during the day) Available Help at Discharge: Family;Available 24 hours/day(daughter to stay with pt for 3 days at d/c) Type of Home: House Home Access: Stairs to enter Entrance Stairs-Rails: None Entrance Stairs-Number of Steps: 4 Home Layout: One level Home Equipment: Walker - 2 wheels;Cane - single point;Bedside commode      Prior Function Level of Independence: Independent  Comments: Pt had returned to work with ability to sit as needed (works on an Theatre stage managerassembly line).  No AD.  No falls in the past 6 months.       Hand Dominance        Extremity/Trunk Assessment   Upper Extremity Assessment Upper Extremity Assessment: Overall WFL for tasks  assessed    Lower Extremity Assessment Lower Extremity Assessment: LLE deficits/detail LLE Deficits / Details: L foot drop with no activation of toe E or tib anterior appreciated.  Pt requires assist to perform SLR with LLE.  Dec sensation top of L foot.        Communication   Communication: Interpreter utilized;Other (comment)(spanish speaking)  Cognition Arousal/Alertness: Awake/alert Behavior During Therapy: WFL for tasks assessed/performed;Anxious(Somewhat anxious about foot drop and pain) Overall Cognitive Status: Within Functional Limits for tasks assessed                                        General Comments General comments (skin integrity, edema, etc.): Utilized interpreter for majority of session until son arrived and pt verbally requesting to use son as interpreter rather than the phone interpreter.  Phone interpreter: Byrd HesselbachMaria [phone].     Exercises Total Joint Exercises Ankle Circles/Pumps: AROM;Right;10 reps;Supine Quad Sets: Strengthening;Left;10 reps;Supine Straight Leg Raises: AAROM;Strengthening;Left;10 reps;Supine Knee Flexion: AAROM;Left;Other reps (comment);Seated(3 reps) Goniometric ROM: -12 to 78 deg   Assessment/Plan    PT Assessment Patient needs continued PT services  PT Problem List Decreased strength;Decreased range of motion;Decreased activity tolerance;Decreased balance;Decreased mobility;Decreased knowledge of use of DME;Decreased safety awareness;Decreased knowledge of precautions;Pain;Impaired sensation       PT Treatment Interventions DME instruction;Gait training;Stair training;Functional mobility training;Therapeutic activities;Therapeutic exercise;Balance training;Neuromuscular re-education;Patient/family education;Modalities    PT Goals (Current goals can be found in the Care Plan section)  Acute Rehab PT Goals Patient Stated Goal: decreased pain and return of sensation and strength in LLE PT Goal Formulation: With  patient Time For Goal Achievement: 09/07/18 Potential to Achieve Goals: Good    Frequency BID   Barriers to discharge        Co-evaluation               AM-PAC PT "6 Clicks" Mobility  Outcome Measure Help needed turning from your back to your side while in a flat bed without using bedrails?: A Little Help needed moving from lying on your back to sitting on the side of a flat bed without using bedrails?: A Little Help needed moving to and from a bed to a chair (including a wheelchair)?: A Little Help needed standing up from a chair using your arms (e.g., wheelchair or bedside chair)?: A Little Help needed to walk in hospital room?: A Little Help needed climbing 3-5 steps with a railing? : A Lot 6 Click Score: 17    End of Session Equipment Utilized During Treatment: Gait belt Activity Tolerance: Patient limited by pain Patient left: in chair;with call bell/phone within reach;with chair alarm set;with family/visitor present;with nursing/sitter in room;Other (comment)(RN at bedside; L knee F and polar care in place) Nurse Communication: Mobility status;Other (comment)(nausea and some dizziness at end of ambulation; pain) PT Visit Diagnosis: Pain;Unsteadiness on feet (R26.81);Other abnormalities of gait and mobility (R26.89);Difficulty in walking, not elsewhere classified (R26.2);Muscle weakness (generalized) (M62.81) Pain - Right/Left: Left Pain - part of body: Knee    Time: 1132-1223 PT Time Calculation (min) (ACUTE ONLY): 51 min  Charges:   PT Evaluation $PT Eval Moderate Complexity: 1 Mod PT Treatments $Gait Training: 23-37 mins $Therapeutic Exercise: 8-22 mins        Encarnacion Chu PT, DPT 08/24/2018, 12:54 PM

## 2018-08-24 NOTE — Progress Notes (Signed)
Pt complains of numbness left foot. Pt unable to dorsiflex left foot. Amador Cunas aware and orders received.

## 2018-08-24 NOTE — Progress Notes (Signed)
Clinical Social Worker (CSW) received SNF consult. PT is recommending home health. RN case manager aware of above. Please reconsult if future social work needs arise. CSW signing off.   Faustina Gebert, LCSW [phone] 

## 2018-08-24 NOTE — Evaluation (Signed)
Occupational Therapy Evaluation Patient Details Name: Tammy Brewer MRN: [id number] DOB: 12-Dec-1955 Today's Date: 08/24/2018    History of Present Illness Pt is a 63 y/o F s/p L TKA.  Pt with L foot drop and  per pt numb feeling  following surgery.  Per Dr. Ernest PineHooten on 1/1 plan is to keep L knee bent when resting.  Pt's PMH includes Bell's palsy.    Clinical Impression   Pt present s/p day one after L TKR - pt present with foot drop and decrease sensation in L foot since surgery - pt report pain 10/10 and this knee not doing as good as the other knee earlier this year. Pt show decrease functional mobilty to bathroom with L knee and foot drop. And increase pain. Pt report she did not use AE after last TKR- but at them moment mod to Max A for LB dressing - and education if she needs AFO- will cont to assess if she needs AE this time and bathroom transfers and toiletting to bathroom - pt can benefit from OT services     [facility]    Equipment Recommendations  3 in 1 bedside commode    Recommendations for Other Services  PT      Precautions / Restrictions Precautions Precautions: Fall Precaution Comments: new L foot drop Restrictions Weight Bearing Restrictions: Yes LLE Weight Bearing: Weight bearing as tolerated      Mobility Bed Mobility Overal bed mobility: Needs Assistance Bed Mobility: Sit to Supine     Supine to sit: Min guard(with legs)     General bed mobility comments: Assist to advance LLE to EOB.  Cues for sequencing.  Pt uses bed rail.   Transfers Overall transfer level: Needs assistance Equipment used: Rolling walker (2 wheeled) Transfers: (BSC to BED) Sit to Stand: Min guard              Balance Overall balance assessment: Needs assistance Sitting-balance support: No upper extremity supported;Feet supported Sitting balance-Leahy Scale: Good     Standing balance support: Single extremity supported;During functional  activity Standing balance-Leahy Scale: Poor Standing balance comment: Pt relies on at least 1UE support for static and dynamic activities                           ADL either performed or assessed with clinical judgement   ADL                                         General ADL Comments: Independent with setup for grooming, eating and UB ADl's - but mod A for LB ADL's - and min A for toiletting BSC     Vision         Perception     Praxis      Pertinent Vitals/Pain Pain Assessment: 0-10 Pain Score: 10-Worst pain ever Faces Pain Scale: Hurts worst Pain Location: L knee Pain Descriptors / Indicators: Grimacing;Guarding;Moaning Pain Intervention(s): Limited activity within patient's tolerance;Monitored during session;Premedicated before session;Repositioned;Ice applied;Utilized relaxation techniques     Hand Dominance     Extremity/Trunk Assessment Upper Extremity Assessment Upper Extremity Assessment: (bilateral UE WFL)   Lower Extremity Assessment Lower Extremity Assessment: LLE deficits/detail LLE Deficits / Details: L foot drop with no activation of toe E or tib anterior appreciated.  Pt requires assist to perform SLR  with LLE.  Dec sensation top of L foot.        Communication Communication Communication: Interpreter utilized;Other (comment)(spanish speaking)   Cognition Arousal/Alertness: Awake/alert Behavior During Therapy: WFL for tasks assessed/performed;Anxious Overall Cognitive Status: Within Functional Limits for tasks assessed                                     General Comments  Utilized interpreter for majority of session until son arrived and pt verbally requesting to use son as interpreter rather than the phone interpreter.  Phone interpreter: [place] [phone].     Exercises Exercises: Total Joint Total Joint Exercises Ankle Circles/Pumps: AROM;Right;10 reps;Supine Quad Sets: Strengthening;Left;10  reps;Supine Straight Leg Raises: AAROM;Strengthening;Left;10 reps;Supine Knee Flexion: AAROM;Left;Other reps (comment);Seated(3 reps) Goniometric ROM: -12 to 78 deg   Shoulder Instructions      Home Living Family/patient expects to be discharged to:: Private residence Living Arrangements: Children Available Help at Discharge: Family;Available 24 hours/day Type of Home: House Home Access: Stairs to enter [employer] of Steps: 4 Entrance Stairs-Rails: None Home Layout: One level     Bathroom Shower/Tub: Chief Strategy Officer: (has BSC) Bathroom Accessibility: Yes   Home Equipment: Walker - 2 wheels;Cane - single point;Bedside commode          Prior Functioning/Environment Level of Independence: Independent        Comments: 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  No AD.  No falls in the past 6 months.          OT Problem List: Decreased strength;Decreased knowledge of use of DME or AE;Impaired balance (sitting and/or standing);Pain;Decreased safety awareness      OT Treatment/Interventions: Self-care/ADL training;Patient/family education;DME and/or AE instruction;Balance training    OT Goals(Current goals can be found in the care plan section) Acute Rehab OT Goals Patient Stated Goal: Want to go home and get my L foot better and leg  OT Goal Formulation: With patient Time For Goal Achievement: 09/03/18  OT Frequency: Min 2X/week   Barriers to D/C:            Co-evaluation              AM-PAC OT "6 Clicks" Daily Activity     Outcome Measure Help from another person eating meals?: None Help from another person taking care of personal grooming?: None Help from another person toileting, which includes using toliet, bedpan, or urinal?: A Little Help from another person bathing (including washing, rinsing, drying)?: A Lot Help from another person to put on and taking off regular upper body  clothing?: None Help from another person to put on and taking off regular lower body clothing?: A Lot 6 Click Score: 19   End of Session Equipment Utilized During Treatment: Gait belt  Activity Tolerance: Patient tolerated treatment well;Patient limited by pain Patient left: in bed;with call bell/phone within reach;with bed alarm set  OT Visit Diagnosis: Other abnormalities of gait and mobility (R26.89);Muscle weakness (generalized) (M62.81)                Time: 1450-1510 OT Time Calculation (min): 20 min Charges:  OT General Charges $OT Visit: 1 Visit OT Evaluation $OT Eval Low Complexity: 1 Low    Jerod Mcquain OTR/L,CLT 08/24/2018, 3:16 PM

## 2018-08-24 NOTE — Progress Notes (Signed)
   Subjective: 1 Day Post-Op Procedure(s) (LRB): TOTAL KNEE ARTHROPLASTY (Left) Patient reports pain as 8 on 0-10 scale.   Patient is well, and has had no acute complaints or problems.  Complains of numbness top of left foot Denies any CP, SOB, ABD pain. We will start physical therapy today.   Objective: Vital signs in last 24 hours: Temp:  [97 F (36.1 C)-98.9 F (37.2 C)] 98.9 F (37.2 C) (01/01 0751) Pulse Rate:  [65-98] 98 (01/01 0751) Resp:  [12-18] 18 (01/01 0751) BP: (93-135)/(52-71) 102/61 (01/01 0751) SpO2:  [96 %-100 %] 96 % (01/01 0751) Weight:  [73.5 kg] 73.5 kg (12/31 1749)  Intake/Output from previous day: 12/31 0701 - 01/01 0700 In: 1708.3 [P.O.:240; I.V.:1468.3] Out: 4492 [EFEOF:1219; Blood:50] Intake/Output this shift: No intake/output data recorded.  Recent Labs    08/24/18 0325  HGB 11.6*   Recent Labs    08/24/18 0325  WBC 8.4  RBC 3.75*  HCT 35.7*  PLT 203   Recent Labs    08/24/18 0325  NA 137  K 3.5  CL 110  CO2 22  BUN 14  CREATININE 0.38*  GLUCOSE 160*  CALCIUM 8.2*   No results for input(s): LABPT, INR in the last 72 hours.  EXAM General - Patient is Alert, Appropriate and Oriented Extremity - Neurovascular intact Intact pulses distally Incision: dressing C/D/I and no drainage No cellulitis present Compartment soft  Unable to dorsiflex left ankle.  Slight sensation loss along the dorsal aspect of the left foot. Dressing - dressing C/D/I and no drainage Motor Function -patient able to straight leg raise but unable to dorsiflex left foot.  Past Medical History:  Diagnosis Date  . Anxiety   . Bell's palsy   . Diabetes mellitus without complication (HCC)   . High cholesterol     Assessment/Plan:   1 Day Post-Op Procedure(s) (LRB): TOTAL KNEE ARTHROPLASTY (Left) Active Problems:   Status post total knee replacement using cement, left  Estimated body mass index is 30.61 kg/m as calculated from the following:  Height as of this encounter: 5\' 1"  (1.549 m).   Weight as of this encounter: 73.5 kg. Advance diet Up with therapy  Needs bowel movement Vital signs stable Labs are stable Overall patient doing well except for moderate to severe pain.  She has taken very minimal pain medications over the last 24 hours.  Last oxycodone dosage given 7 PM 08/23/2017. Patient with left foot drop.  Ace wrap and dressing removed, honeycomb dressing applied.  Patient with no drainage.  Will inquire about getting a brace for her left ankle to help with ambulation and continue to monitor.  DVT Prophylaxis - Aspirin, TED hose and SCDs Weight-Bearing as tolerated to left leg   T. Cranston Neighbor, PA-C [facility] 08/24/2018, 8:01 AM

## 2018-08-24 NOTE — Care Management Note (Signed)
Case Management Note  Patient Details  Name: Tammy Brewer MRN: [id number] Date of Birth: 30-Oct-1955  Subjective/Objective:                   Met with Patient and Son to discuss Discharge plan Patient lives at home with spouse Patient has adult children that will help when she is at home and will take to appointments [name] Patient had knee replacement 6 months ago and used [facility], [facility] agrees to take patient back Patient has DME at home including a 3 in one and a walker [facility] Patient uses [facility] on [address] and can afford meds Action/Plan:  Provided [facility] list per [email or web address]  Expected Discharge Date:                  Expected Discharge Plan:     In-House Referral:     Discharge planning Services  CM Consult  Post Acute Care Choice:  Home Health Choice offered to:  Patient, Adult Children  DME Arranged:    DME Agency:     HH Arranged:  PT [place]:  [facility] [employer]  Status of Service:  In process, will continue to follow  If discussed at Long Length of Stay Meetings, dates discussed:    Additional Comments:  Su Hilt, RN 08/24/2018, 1:34 PM

## 2018-08-25 LAB — GLUCOSE, CAPILLARY
Glucose-Capillary: 143 mg/dL — ABNORMAL HIGH (ref 70–99)
Glucose-Capillary: 206 mg/dL — ABNORMAL HIGH (ref 70–99)
Glucose-Capillary: 117 mg/dL — ABNORMAL HIGH (ref 70–99)
Glucose-Capillary: 159 mg/dL — ABNORMAL HIGH (ref 70–99)

## 2018-08-25 MED ORDER — BISACODYL 5 MG PO TBEC: 5.0000 mg | DELAYED_RELEASE_TABLET | Freq: Every day | ORAL | 0 refills | Status: DC | PRN

## 2018-08-25 MED ORDER — OXYCODONE HCL 5 MG PO TABS: 5.0000 mg | ORAL_TABLET | ORAL | 0 refills | Status: DC | PRN

## 2018-08-25 MED ORDER — ASPIRIN 325 MG PO TBEC: 325.0000 mg | DELAYED_RELEASE_TABLET | Freq: Every day | ORAL | 0 refills | Status: AC

## 2018-08-25 NOTE — Progress Notes (Addendum)
Physical Therapy Treatment Patient Details Name: Tammy Brewer MRN: [id number] DOB: 06-Nov-1955 Today's Date: 08/25/2018    History of Present Illness Pt is a 63 y/o F s/p L TKA.  Pt with L foot drop and decreased sensation following surgery.  Per Dr. Ernest Pine on 1/1 plan is to keep L knee bent when resting.  Pt's PMH includes Bell's palsy.     PT Comments    Pt demonstrates 5/5 L DF strength but continues to report mild numbness/tingling distally from L knee. Cues provided for safety with transfers and improved gait mechanics while ambulating.  Follow up recommendations remain appropriate.     Follow Up Recommendations  Home health PT;Supervision for mobility/OOB     Equipment Recommendations  None recommended by PT    Recommendations for Other Services OT consult     Precautions / Restrictions Precautions Precautions: Fall Restrictions Weight Bearing Restrictions: Yes LLE Weight Bearing: Weight bearing as tolerated    Mobility  Bed Mobility Overal bed mobility: Needs Assistance Bed Mobility: Supine to Sit     Supine to sit: HOB elevated;Min assist     General bed mobility comments: Assist to advance LLE to EOB.  Cues for sequencing.  Pt uses bed rail.   Transfers Overall transfer level: Needs assistance Equipment used: Rolling walker (2 wheeled) Transfers: Sit to/from Stand Sit to Stand: Min guard         General transfer comment: Cues for proper hand placement.  Pt slow to stand.  She demonstrates very well controlled descent to sit.   Ambulation/Gait Ambulation/Gait assistance: Min guard Gait Distance (Feet): 120 Feet Assistive device: Rolling walker (2 wheeled) Gait Pattern/deviations: Step-to pattern;Decreased dorsiflexion - left;Decreased stance time - left;Decreased step length - right;Antalgic;Step-through pattern Gait velocity: decreased   General Gait Details: Pt naturally advances to step through pattern without cues.  Verbal cue provided with  demonstration for increased L knee F at terminal stance.    Stairs             Wheelchair Mobility    Modified Rankin (Stroke Patients Only)       Balance Overall balance assessment: Needs assistance Sitting-balance support: No upper extremity supported;Feet supported Sitting balance-Leahy Scale: Good     Standing balance support: Single extremity supported;During functional activity Standing balance-Leahy Scale: Fair Standing balance comment: Pt able to stand statically without UE support but relies on RW to ambulate                            Cognition Arousal/Alertness: Awake/alert Behavior During Therapy: WFL for tasks assessed/performed Overall Cognitive Status: Within Functional Limits for tasks assessed                                        Exercises Total Joint Exercises Ankle Circles/Pumps: AROM;10 reps;Supine;Both Quad Sets: Strengthening;Left;10 reps;Supine Straight Leg Raises: AAROM;Strengthening;Left;10 reps;Supine Knee Flexion: AAROM;Left;Other reps (comment);Seated(3 reps with 5 sec holds) Goniometric ROM: -8 to 84 deg    General Comments General comments (skin integrity, edema, etc.): Phone interpreter utilized: Marylene Land #[phone].  Pt demonstrates 5/5 L DF strength but continues to report mild numbness/tingling distally from L knee.       Pertinent Vitals/Pain Pain Assessment: Faces Faces Pain Scale: Hurts even more Pain Location: L knee Pain Descriptors / Indicators: Grimacing;Guarding;Moaning Pain Intervention(s): Limited activity within patient's tolerance;Monitored during session;Premedicated before  session;Repositioned;Ice applied;Utilized relaxation techniques    Home Living                      Prior Function            PT Goals (current goals can now be found in the care plan section) Acute Rehab PT Goals Patient Stated Goal: to continue improving PT Goal Formulation: With patient Time For Goal  Achievement: 09/07/18 Potential to Achieve Goals: Good Progress towards PT goals: Progressing toward goals    Frequency    BID      PT Plan Current plan remains appropriate    Co-evaluation              AM-PAC PT "6 Clicks" Mobility   Outcome Measure  Help needed turning from your back to your side while in a flat bed without using bedrails?: A Little Help needed moving from lying on your back to sitting on the side of a flat bed without using bedrails?: A Little Help needed moving to and from a bed to a chair (including a wheelchair)?: A Little Help needed standing up from a chair using your arms (e.g., wheelchair or bedside chair)?: A Little Help needed to walk in hospital room?: A Little Help needed climbing 3-5 steps with a railing? : A Little 6 Click Score: 18    End of Session Equipment Utilized During Treatment: Gait belt Activity Tolerance: Patient limited by fatigue Patient left: in chair;with call bell/phone within reach;with chair alarm set;Other (comment);with SCD's reapplied(polar care) Nurse Communication: Mobility status PT Visit Diagnosis: Pain;Unsteadiness on feet (R26.81);Other abnormalities of gait and mobility (R26.89);Difficulty in walking, not elsewhere classified (R26.2);Muscle weakness (generalized) (M62.81) Pain - Right/Left: Left Pain - part of body: Knee     Time: 4944-9675 PT Time Calculation (min) (ACUTE ONLY): 43 min  Charges:  $Gait Training: 8-22 mins $Therapeutic Exercise: 8-22 mins $Therapeutic Activity: 8-22 mins                     Encarnacion Chu PT, DPT 08/25/2018, 11:59 AM

## 2018-08-25 NOTE — Progress Notes (Signed)
   Subjective: 2 Days Post-Op Procedure(s) (LRB): TOTAL KNEE ARTHROPLASTY (Left) Patient reports pain as moderate.   Patient is well, and has had no acute complaints or problems.  Numbness improved to the dorsal aspect of the left foot.  Patient able to ankle plantarflex and dorsiflex.  Denies any CP, SOB, ABD pain. We will continue with physical therapy today.   Objective: Vital signs in last 24 hours: Temp:  [99.8 F (37.7 C)-100.1 F (37.8 C)] 99.8 F (37.7 C) (01/01 2322) Pulse Rate:  [104-110] 104 (01/01 2322) Resp:  [14-18] 14 (01/01 2322) BP: (128-135)/(74-77) 135/74 (01/01 2322) SpO2:  [97 %] 97 % (01/01 2322)  Intake/Output from previous day: 01/01 0701 - 01/02 0700 In: 960 [P.O.:960] Out: 800 [Urine:800] Intake/Output this shift: No intake/output data recorded.  Recent Labs    08/24/18 0325  HGB 11.6*   Recent Labs    08/24/18 0325  WBC 8.4  RBC 3.75*  HCT 35.7*  PLT 203   Recent Labs    08/24/18 0325  NA 137  K 3.5  CL 110  CO2 22  BUN 14  CREATININE 0.38*  GLUCOSE 160*  CALCIUM 8.2*   No results for input(s): LABPT, INR in the last 72 hours.  EXAM General - Patient is Alert, Appropriate and Oriented Extremity - Neurovascular intact Intact pulses distally Incision: dressing C/D/I and no drainage No cellulitis present Compartment soft  Ankle plantar flexion dorsiflexion are intact.  Sensation intact along the dorsum of the foot. Dressing - dressing C/D/I and no drainage Motor Function -moves foot and toes well on exam of left leg.  Past Medical History:  Diagnosis Date  . Anxiety   . Bell's palsy   . Diabetes mellitus without complication (HCC)   . High cholesterol     Assessment/Plan:   2 Days Post-Op Procedure(s) (LRB): TOTAL KNEE ARTHROPLASTY (Left) Active Problems:   Status post total knee replacement using cement, left  Estimated body mass index is 30.61 kg/m as calculated from the following:   Height as of this encounter:  5\' 1"  (1.549 m).   Weight as of this encounter: 73.5 kg. Advance diet Up with therapy  Needs bowel movement Vital signs stable, monitor heart rate Continue with physical therapy Peroneal nerve palsy resolving, patient able to plantarflex and dorsiflex. Care management to assist with discharge to home with home health PT   DVT Prophylaxis - Aspirin, TED hose and SCDs Weight-Bearing as tolerated to left leg   T. Cranston Neighbor, PA-C [facility] 08/25/2018, 8:42 AM

## 2018-08-25 NOTE — Anesthesia Postprocedure Evaluation (Signed)
Anesthesia Post Note  Patient: GLADIES UNGAR  Procedure(s) Performed: TOTAL KNEE ARTHROPLASTY (Left Knee)  Patient location during evaluation: Nursing Unit Anesthesia Type: Spinal Level of consciousness: oriented and awake and alert Pain management: pain level controlled Vital Signs Assessment: post-procedure vital signs reviewed and stable Respiratory status: spontaneous breathing and respiratory function stable Cardiovascular status: blood pressure returned to baseline and stable Postop Assessment: no headache, no backache, no apparent nausea or vomiting and patient able to bend at knees Anesthetic complications: no     Last Vitals:  Vitals:   08/24/18 1723 08/24/18 2322  BP: 128/77 135/74  Pulse: (!) 110 (!) 104  Resp: 18 14  Temp: 37.8 C 37.7 C  SpO2: 97% 97%    Last Pain:  Vitals:   08/25/18 0523  TempSrc:   PainSc: 3                  Jules Schick

## 2018-08-25 NOTE — Discharge Instructions (Signed)

## 2018-08-25 NOTE — Discharge Summary (Signed)
Physician Discharge Summary  Patient ID: Tammy Brewer MRN: [id number] DOB/AGE: 02-21-1956 63 y.o.  Admit date: 08/23/2018 Discharge date: 08/26/2018 Admission Diagnoses:  PRIMARY OSTEOARHTRITIS OF LEFT KNEE   Discharge Diagnoses: Patient Active Problem List   Diagnosis Date Noted  . Status post total knee replacement using cement, 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  . Status post total knee replacement using cement, right 03/22/2018    Past Medical History:  Diagnosis Date  . Anxiety   . Bell's palsy   . Diabetes mellitus without complication (HCC)   . High cholesterol      Transfusion: none   Consultants (if any):   Discharged Condition: Improved  Hospital Course: Tammy Brewer is an 63 y.o. female who was admitted 08/23/2018 with a diagnosis of <principal problem not specified> and went to the operating room on 08/23/2018 and underwent the above named procedures.    Surgeries: Procedure(s): TOTAL KNEE ARTHROPLASTY on 08/23/2018 Patient tolerated the surgery well. Taken to PACU where she was stabilized and then transferred to the orthopedic floor.  Started on aspirin, teds, SCDs.  Heels elevated on bed with rolled towels. No evidence of DVT. Negative Homan. Physical therapy started on day #1 for gait training and transfer. OT started day #1 for ADL and assisted devices.  Patient's foley was d/c on day #1. Patient's IV was d/c on day #2.  On post op day #3 patient was stable and ready for discharge to home with home health PT.  Implants: Medacta GMK near 2+ femur, 2 tibia, 14 mm insert with size 1 patella, all components cemented  She was given perioperative antibiotics:  Anti-infectives (From admission, onward)   Start     Dose/Rate Route Frequency Ordered Stop   08/23/18 1600  ceFAZolin (ANCEF) IVPB 2g/100 mL premix     2 g 200 mL/hr over 30 Minutes Intravenous Every 6 hours 08/23/18 1310 08/23/18 2138   08/23/18 1030  gentamicin (GARAMYCIN) 80 mg in sodium chloride 0.9 % 500 mL  irrigation  Status:  Discontinued       As needed 08/23/18 1030 08/23/18 1137   08/23/18 0826  ceFAZolin (ANCEF) 2-4 GM/100ML-% IVPB    Note to Pharmacy:  Agnes Lawrence  : cabinet override      08/23/18 0826 08/23/18 2044   08/22/18 2115  ceFAZolin (ANCEF) IVPB 2g/100 mL premix     2 g 200 mL/hr over 30 Minutes Intravenous  Once 08/22/18 2106 08/23/18 1037    .  She was given sequential compression devices, early ambulation, and aspirin, teds for DVT prophylaxis.  She benefited maximally from the hospital stay and there were no complications.    Recent vital signs:  Vitals:   08/24/18 2322 08/25/18 0852  BP: 135/74 108/65  Pulse: (!) 104 94  Resp: 14 18  Temp: 99.8 F (37.7 C) (!) 97.4 F (36.3 C)  SpO2: 97% 98%    Recent laboratory studies:  Lab Results  Component Value Date   HGB 11.6 (L) 08/24/2018   HGB 13.7 08/09/2018   HGB 12.6 03/25/2018   Lab Results  Component Value Date   WBC 8.4 08/24/2018   PLT 203 08/24/2018   Lab Results  Component Value Date   INR 0.88 08/09/2018   Lab Results  Component Value Date   NA 137 08/24/2018   K 3.5 08/24/2018   CL 110 08/24/2018   CO2 22 08/24/2018   BUN 14 08/24/2018   CREATININE 0.38 (L) 08/24/2018   GLUCOSE 160 (H) 08/24/2018  Discharge Medications:   Allergies as of 08/25/2018   No Known Allergies     Medication List    STOP taking these medications   HYDROcodone-acetaminophen 5-325 MG tablet Commonly known as:  NORCO/VICODIN     TAKE these medications   aspirin 325 MG EC tablet Take 1 tablet (325 mg total) by mouth daily with breakfast. What changed:    when to take this  reasons to take this   bisacodyl 5 MG EC tablet Commonly known as:  DULCOLAX Take 1 tablet (5 mg total) by mouth daily as needed for moderate constipation.   docusate sodium 100 MG capsule Commonly known as:  COLACE Take 1 capsule (100 mg total) by mouth 2 (two) times daily.   glimepiride 4 MG tablet Commonly  known as:  AMARYL Take 4 mg by mouth daily with breakfast.   JARDIANCE 25 MG Tabs tablet Generic drug:  empagliflozin Take 25 mg by mouth daily.   metFORMIN 500 MG 24 hr tablet Commonly known as:  GLUCOPHAGE-XR Take 500 mg by mouth 2 (two) times daily. At lunch and at bedtime   methocarbamol 500 MG tablet Commonly known as:  ROBAXIN Take 1 tablet (500 mg total) by mouth every 6 (six) hours as needed for muscle spasms.   oxyCODONE 5 MG immediate release tablet Commonly known as:  Oxy IR/ROXICODONE Take 1-2 tablets (5-10 mg total) by mouth every 4 (four) hours as needed for moderate pain (pain score 4-6).   rosuvastatin 5 MG tablet Commonly known as:  CRESTOR Take 5 mg by mouth at bedtime.   triamcinolone ointment 0.1 % Commonly known as:  KENALOG Apply 1 application topically 2 (two) times daily.       Diagnostic Studies: Dg Knee 1-2 Views Left  Result Date: 08/23/2018 CLINICAL DATA:  Total knee replacement. EXAM: LEFT KNEE - 1-2 VIEW COMPARISON:  CT 07/19/2018. FINDINGS: Total left knee replacement. Hardware intact. Anatomic alignment. No acute bony abnormality. 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 Electronically Signed   By: Maisie Fus  Register   On: 08/23/2018 12:14    Disposition:     Follow-up Information    Evon Slack, PA-C Follow up in 2 week(s).   Specialties:  Orthopedic Surgery, Emergency Medicine Contact information: [address] [place] [address] [phone]            Signed: Patience Musca 08/25/2018, 8:54 AM

## 2018-08-25 NOTE — Progress Notes (Signed)
Physical Therapy Treatment Patient Details Name: Tammy Brewer MRN: [id number] DOB: 1956/02/18 Today's Date: 08/25/2018    History of Present Illness Pt is a 63 y/o F s/p L TKA.  Pt with L foot drop and decreased sensation following surgery.  Per Dr. Ernest Pine on 1/1 plan is to keep L knee bent when resting.  Pt's PMH includes Bell's palsy.     PT Comments    Ms. Winberry made good progress toward her mobility goals, improving her gait speed and gait mechanics with cues.  She ambulated 160 ft around the nurses station. She does continue to require min assist for bed mobility.  Will initiate stair training next date. Follow up recommendations remain appropriate.     Follow Up Recommendations  Home health PT;Supervision for mobility/OOB     Equipment Recommendations  None recommended by PT    Recommendations for Other Services       Precautions / Restrictions Precautions Precautions: Fall Restrictions Weight Bearing Restrictions: Yes LLE Weight Bearing: Weight bearing as tolerated    Mobility  Bed Mobility Overal bed mobility: Needs Assistance Bed Mobility: Supine to Sit;Sit to Supine     Supine to sit: HOB elevated;Min assist Sit to supine: Min assist   General bed mobility comments: Assist to advance LLE to EOB.  Pt uses bed rail. Assist for LLE management to return to supine.   Transfers Overall transfer level: Needs assistance Equipment used: Rolling walker (2 wheeled) Transfers: Sit to/from Stand Sit to Stand: Min guard         General transfer comment: Pt demonstrates proper hand placement.  Pt slow to stand.    Ambulation/Gait Ambulation/Gait assistance: Min guard Gait Distance (Feet): 160 Feet Assistive device: Rolling walker (2 wheeled) Gait Pattern/deviations: Decreased stance time - left;Decreased step length - right;Antalgic;Step-through pattern Gait velocity: decreased   General Gait Details: Cues for increased L knee flexion at terminal stance and for  increased gait speed to progress toward normalizing gait pattern.    Stairs             Wheelchair Mobility    Modified Rankin (Stroke Patients Only)       Balance Overall balance assessment: Needs assistance Sitting-balance support: No upper extremity supported;Feet supported Sitting balance-Leahy Scale: Good     Standing balance support: Single extremity supported;During functional activity Standing balance-Leahy Scale: Fair Standing balance comment: Pt able to stand statically without UE support but relies on RW to ambulate                            Cognition Arousal/Alertness: Awake/alert Behavior During Therapy: WFL for tasks assessed/performed Overall Cognitive Status: Within Functional Limits for tasks assessed                                        Exercises Total Joint Exercises Ankle Circles/Pumps: AROM;10 reps;Supine;Both Quad Sets: Strengthening;Left;10 reps;Supine Knee Flexion: Left;AROM;10 reps;Standing Other Exercises Other Exercises: Heel raises in standing x10 with BUE support on RW.     General Comments General comments (skin integrity, edema, etc.): Phone interpreter utilized: [place] #[phone].  Pt in bed at end of session and bed controls not working to set bed alarm saying that bed is not plugged in.  However, bed plugged in and this PT attempted to unplug and plug back in with no results.  RN notified.  Pertinent Vitals/Pain Pain Assessment: 0-10 Pain Score: 5  Pain Location: L knee Pain Descriptors / Indicators: Grimacing;Guarding;Moaning Pain Intervention(s): Limited activity within patient's tolerance;Monitored during session;Premedicated before session;Ice applied;Utilized relaxation techniques    Home Living                      Prior Function            PT Goals (current goals can now be found in the care plan section) Acute Rehab PT Goals Patient Stated Goal: to continue improving PT  Goal Formulation: With patient Time For Goal Achievement: 09/07/18 Potential to Achieve Goals: Good Progress towards PT goals: Progressing toward goals    Frequency    BID      PT Plan Current plan remains appropriate    Co-evaluation              AM-PAC PT "6 Clicks" Mobility   Outcome Measure  Help needed turning from your back to your side while in a flat bed without using bedrails?: A Little Help needed moving from lying on your back to sitting on the side of a flat bed without using bedrails?: A Little Help needed moving to and from a bed to a chair (including a wheelchair)?: A Little Help needed standing up from a chair using your arms (e.g., wheelchair or bedside chair)?: A Little Help needed to walk in hospital room?: A Little Help needed climbing 3-5 steps with a railing? : A Little 6 Click Score: 18    End of Session Equipment Utilized During Treatment: Gait belt Activity Tolerance: Patient tolerated treatment well Patient left: with call bell/phone within reach;Other (comment);with SCD's reapplied;in bed;with bed alarm set(polar care) Nurse Communication: Mobility status;Other (comment)(bed reporting that it's not plugged in;unable to set alarm) PT Visit Diagnosis: Pain;Unsteadiness on feet (R26.81);Other abnormalities of gait and mobility (R26.89);Difficulty in walking, not elsewhere classified (R26.2);Muscle weakness (generalized) (M62.81) Pain - Right/Left: Left Pain - part of body: Knee     Time: [id number] PT Time Calculation (min) (ACUTE ONLY): 40 min  Charges:  $Gait Training: 23-37 mins $Therapeutic Exercise: 8-22 mins                     Encarnacion ChuAshley Abashian PT, DPT 08/25/2018, 4:15 PM

## 2018-08-26 LAB — GLUCOSE, CAPILLARY
Glucose-Capillary: 179 mg/dL — ABNORMAL HIGH (ref 70–99)
Glucose-Capillary: 233 mg/dL — ABNORMAL HIGH (ref 70–99)

## 2018-08-26 MED ORDER — FLEET ENEMA 7-19 GM/118ML RE ENEM: 1.0000 | ENEMA | Freq: Once | RECTAL | Status: DC

## 2018-08-26 MED ORDER — BISACODYL 10 MG RE SUPP
10.0000 mg | Freq: Once | RECTAL | Status: AC
  Administered 2018-08-26: 10 mg via RECTAL
  Filled 2018-08-26: qty 1

## 2018-08-26 NOTE — Progress Notes (Signed)
Physical Therapy Treatment Patient Details Name: Tammy Brewer MRN: [id number] DOB: [birth year] Today's Date: 08/26/2018    History of Present Illness Pt is a 63 y/o F s/p L TKA.  Pt with L foot drop and decreased sensation following surgery.  Per Dr. Ernest Pine on 1/1 plan is to keep L knee bent when resting.  Pt's PMH includes Bell's palsy.     PT Comments    Ms. Schullo completed her stair training this date and demonstrated proper technique with all aspects of mobility.  Pt remains steady throughout session using RW.  Follow up recommendations remain appropriate.    Follow Up Recommendations  Home health PT;Supervision for mobility/OOB     Equipment Recommendations  None recommended by PT    Recommendations for Other Services       Precautions / Restrictions Precautions Precautions: Fall Restrictions Weight Bearing Restrictions: Yes LLE Weight Bearing: Weight bearing as tolerated    Mobility  Bed Mobility Overal bed mobility: Needs Assistance Bed Mobility: Supine to Sit     Supine to sit: HOB elevated;Min assist     General bed mobility comments: Assist to advance LLE to EOB.  Increased time and effort.   Transfers Overall transfer level: Needs assistance Equipment used: Rolling walker (2 wheeled) Transfers: Sit to/from Stand Sit to Stand: Min guard         General transfer comment: Pt demonstrates proper hand placement.  Pt slow to stand.  Well controlled descent to sit.   Ambulation/Gait Ambulation/Gait assistance: Min guard Gait Distance (Feet): 100 Feet Assistive device: Rolling walker (2 wheeled) Gait Pattern/deviations: Decreased stance time - left;Decreased step length - right;Antalgic;Step-through pattern Gait velocity: decreased   General Gait Details: Improved gait speed and mechanics without cues needed.  Pt remains steady with RW.  Min guard provided for safety.    Stairs Stairs: Yes Stairs assistance: Min assist Stair Management: No  rails;Step to pattern;Backwards;With walker Number of Stairs: 3 General stair comments: Cues and demonstration for proper technique.  Min assist provided to hold RW in place and instructions given to have assist do the same at d/c.    Wheelchair Mobility    Modified Rankin (Stroke Patients Only)       Balance Overall balance assessment: Needs assistance Sitting-balance support: No upper extremity supported;Feet supported Sitting balance-Leahy Scale: Good     Standing balance support: Single extremity supported;During functional activity Standing balance-Leahy Scale: Fair Standing balance comment: Pt able to stand statically without UE support but relies on RW to ambulate                            Cognition Arousal/Alertness: Awake/alert Behavior During Therapy: WFL for tasks assessed/performed Overall Cognitive Status: Within Functional Limits for tasks assessed                                        Exercises Total Joint Exercises Quad Sets: Strengthening;Left;10 reps;Seated Knee Flexion: Left;AROM;Other reps (comment);Seated(3 reps with 5 second holds) Goniometric ROM: -8 to 97 deg    General Comments General comments (skin integrity, edema, etc.): Phone interpreter utilized: Elita Quick [phone]      Pertinent Vitals/Pain Pain Assessment: Faces Faces Pain Scale: Hurts even more Pain Location: L knee Pain Descriptors / Indicators: Grimacing;Guarding;Moaning Pain Intervention(s): Limited activity within patient's tolerance;Monitored during session;Repositioned;Ice applied;Utilized relaxation techniques;Premedicated before session    Home  Living                      Prior Function            PT Goals (current goals can now be found in the care plan section) Acute Rehab PT Goals Patient Stated Goal: to continue improving PT Goal Formulation: With patient Time For Goal Achievement: 09/07/18 Potential to Achieve Goals: Good Progress  towards PT goals: Progressing toward goals    Frequency    BID      PT Plan Current plan remains appropriate    Co-evaluation              AM-PAC PT "6 Clicks" Mobility   Outcome Measure  Help needed turning from your back to your side while in a flat bed without using bedrails?: A Little Help needed moving from lying on your back to sitting on the side of a flat bed without using bedrails?: A Little Help needed moving to and from a bed to a chair (including a wheelchair)?: A Little Help needed standing up from a chair using your arms (e.g., wheelchair or bedside chair)?: A Little Help needed to walk in hospital room?: A Little Help needed climbing 3-5 steps with a railing? : A Little 6 Click Score: 18    End of Session Equipment Utilized During Treatment: Gait belt Activity Tolerance: Patient tolerated treatment well Patient left: with call bell/phone within reach;Other (comment);with SCD's reapplied;in chair;with chair alarm set(polar care) Nurse Communication: Mobility status PT Visit Diagnosis: Pain;Unsteadiness on feet (R26.81);Other abnormalities of gait and mobility (R26.89);Difficulty in walking, not elsewhere classified (R26.2);Muscle weakness (generalized) (M62.81) Pain - Right/Left: Left Pain - part of body: Knee     Time: [id number] PT Time Calculation (min) (ACUTE ONLY): 45 min  Charges:  $Gait Training: 23-37 mins $Therapeutic Activity: 8-22 mins                    Encarnacion ChuAshley Abashian PT, DPT 08/26/2018, 11:03 AM

## 2018-08-26 NOTE — Progress Notes (Signed)
DISCHARGE NOTE:  Spanish interpreter at bedside, discharge instructions given to pt. Pt verbalized understanding. Pt waiting on ride.

## 2018-08-26 NOTE — Progress Notes (Signed)
   Subjective: 3 Days Post-Op Procedure(s) (LRB): TOTAL KNEE ARTHROPLASTY (Left) Patient reports pain as mild.   Patient is well, and has had no acute complaints or problems. Denies any CP, SOB, ABD pain. We will continue with physical therapy today.   Objective: Vital signs in last 24 hours: Temp:  [97.4 F (36.3 C)-100 F (37.8 C)] 99.4 F (37.4 C) (01/03 0022) Pulse Rate:  [88-101] 101 (01/02 2321) Resp:  [16-19] 19 (01/02 2321) BP: (108-123)/(65-69) 123/69 (01/02 2321) SpO2:  [97 %-99 %] 97 % (01/02 2321)  Intake/Output from previous day: 01/02 0701 - 01/03 0700 In: 240 [P.O.:240] Out: -  Intake/Output this shift: No intake/output data recorded.  Recent Labs    08/24/18 0325  HGB 11.6*   Recent Labs    08/24/18 0325  WBC 8.4  RBC 3.75*  HCT 35.7*  PLT 203   Recent Labs    08/24/18 0325  NA 137  K 3.5  CL 110  CO2 22  BUN 14  CREATININE 0.38*  GLUCOSE 160*  CALCIUM 8.2*   No results for input(s): LABPT, INR in the last 72 hours.  EXAM General - Patient is Alert, Appropriate and Oriented Extremity - Neurovascular intact Intact pulses distally Incision: dressing C/D/I and no drainage No cellulitis present Compartment soft  Ankle plantar flexion dorsiflexion are intact.  Sensation intact along the dorsum of the foot. Dressing - dressing C/D/I and no drainage Motor Function -moves foot and toes well on exam of left leg.  Past Medical History:  Diagnosis Date  . Anxiety   . Bell's palsy   . Diabetes mellitus without complication (HCC)   . High cholesterol     Assessment/Plan:   3 Days Post-Op Procedure(s) (LRB): TOTAL KNEE ARTHROPLASTY (Left) Active Problems:   Status post total knee replacement using cement, left  Estimated body mass index is 30.61 kg/m as calculated from the following:   Height as of this encounter: 5\' 1"  (1.549 m).   Weight as of this encounter: 73.5 kg. Advance diet Up with therapy  Needs bowel movement Vital  signs stable Continue with physical therapy Care management to assist with discharge to home with home health PT today   DVT Prophylaxis - Aspirin, TED hose and SCDs Weight-Bearing as tolerated to left leg   T. Cranston Neighbor, PA-C [facility] 08/26/2018, 7:41 AM

## 2018-08-29 ENCOUNTER — Encounter: Payer: Self-pay | Admitting: Orthopedic Surgery

## 2018-10-22 IMAGING — DX DG CHEST 1V PORT
1 series · 1 of 1 positions shown · non-contrast
Comparison: 11/13/2013.

CLINICAL DATA: Total knee replacement.

EXAM:
PORTABLE CHEST 1 VIEW

[chest ap]
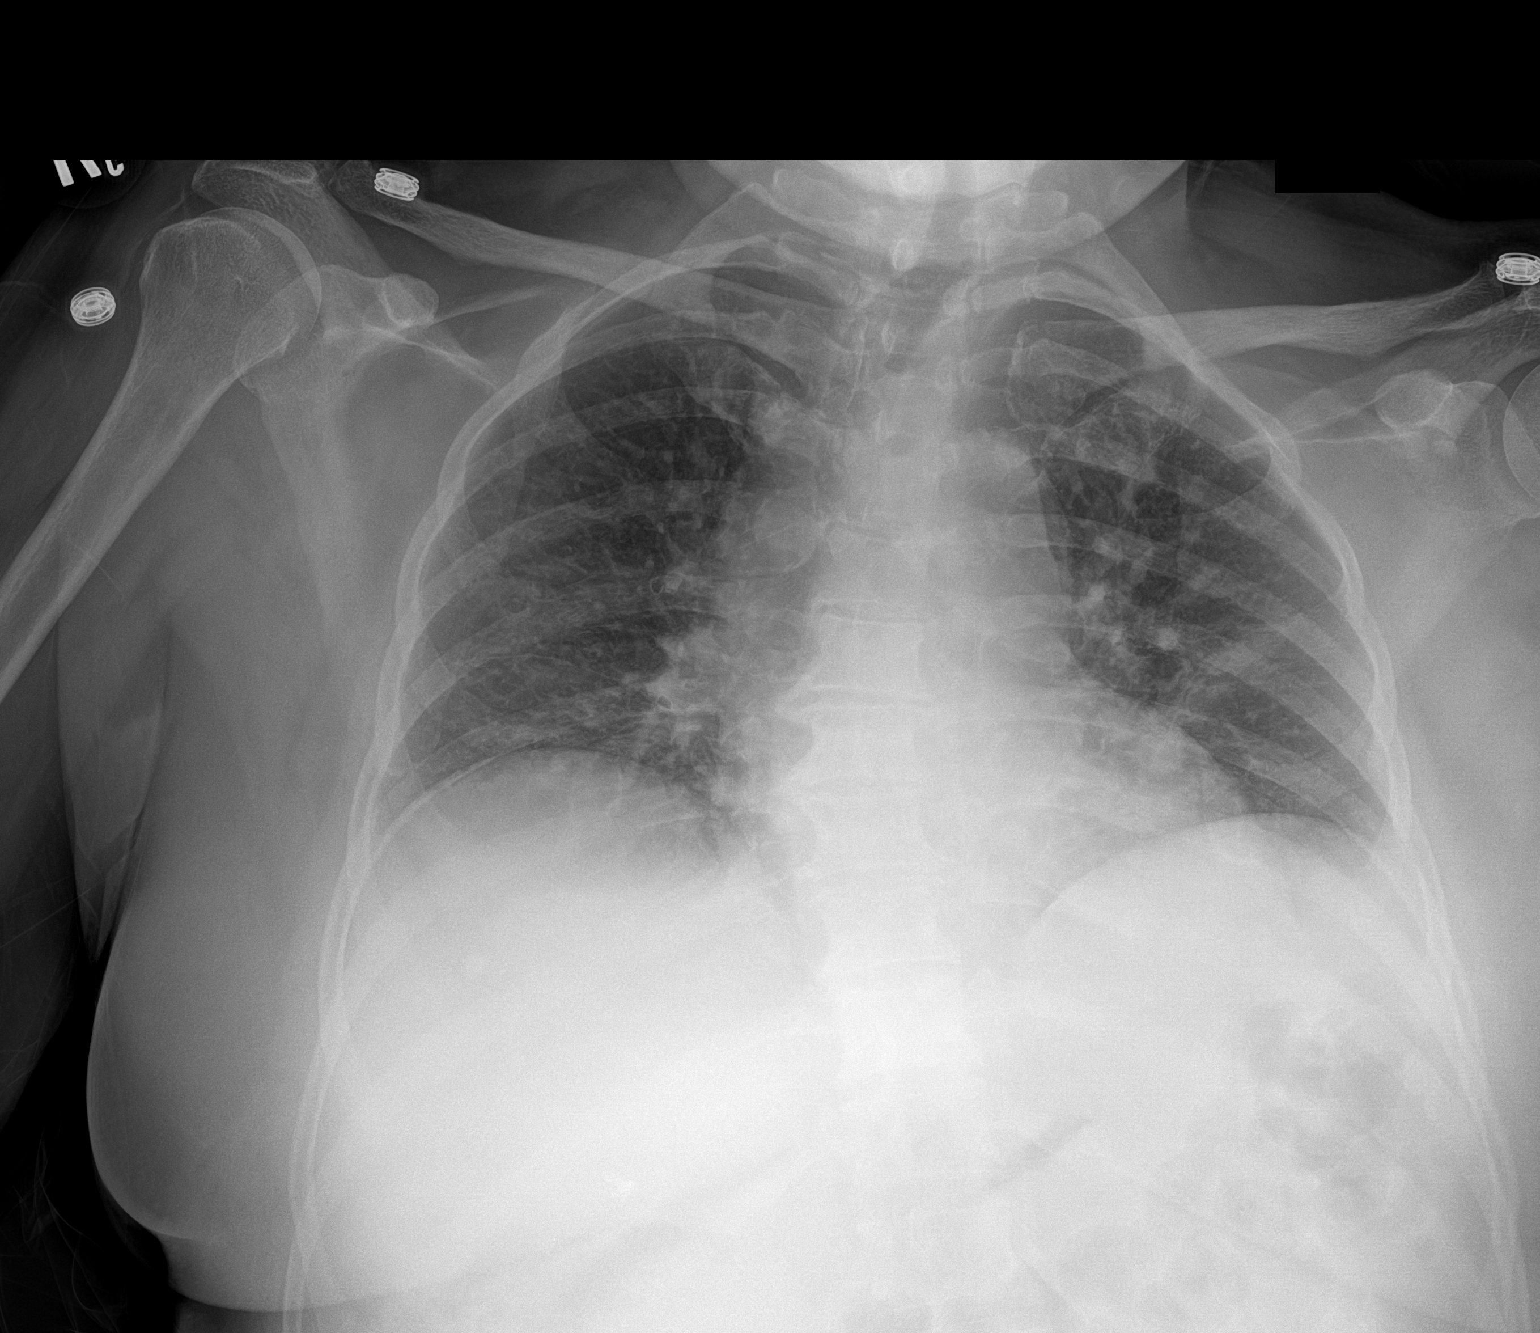

[1 of 1 positions shown; findings below may reference images not displayed]

FINDINGS: Mediastinum and hilar structures normal. Cardiomegaly with mild
pulmonary vascular prominence. Mild bilateral interstitial
prominence. Small left pleural effusion. No pneumothorax.
IMPRESSION: 1.  Cardiomegaly with mild pulmonary venous congestion.

2. Mild basilar interstitial prominence. Small left pleural
effusion.

## 2018-10-25 ENCOUNTER — Other Ambulatory Visit: Payer: Self-pay | Admitting: Obstetrics and Gynecology

## 2018-10-25 DIAGNOSIS — Z1231 Encounter for screening mammogram for malignant neoplasm of breast: Secondary | ICD-10-CM

## 2018-11-08 ENCOUNTER — Inpatient Hospital Stay: Admission: RE | Admit: 2018-11-08 | Payer: Self-pay | Source: Ambulatory Visit

## 2019-02-16 IMAGING — CT CT KNEE*L* W/O CM
3 of 5 series · 9 of 33 positions shown, 10 images · non-contrast
Comparison: Left knee x-rays dated June 06, 2007.

CLINICAL DATA: Preoperative examination for left knee arthroplasty.

EXAM:
CT OF THE LEFT KNEE WITHOUT CONTRAST
TECHNIQUE: Multidetector CT imaging of the left knee was performed according to
the standard protocol. Multiplanar CT image reconstructions were
also generated.

[Series 5: coronal st · coronal · 0.32mm/px · 3 of 61 slices shown]
[im 13/61  bone]
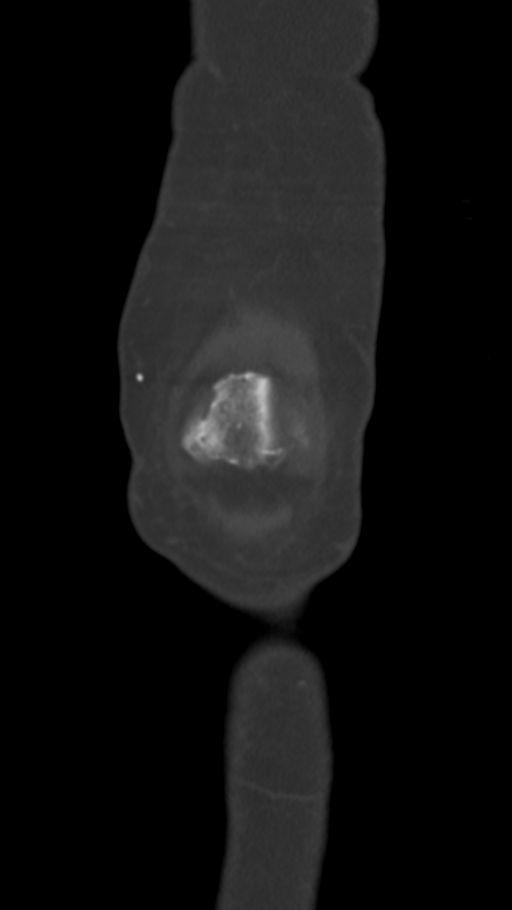
[im 25/61  bone]
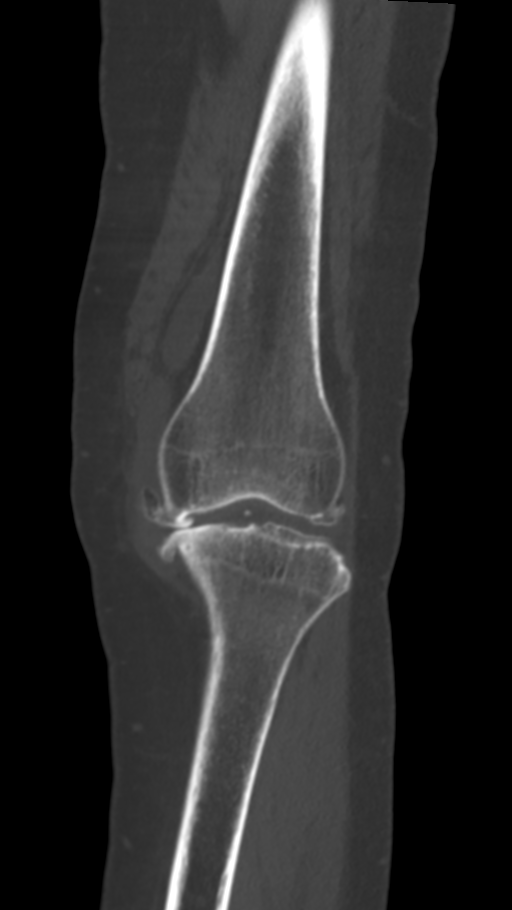
[im 37/61  bone]
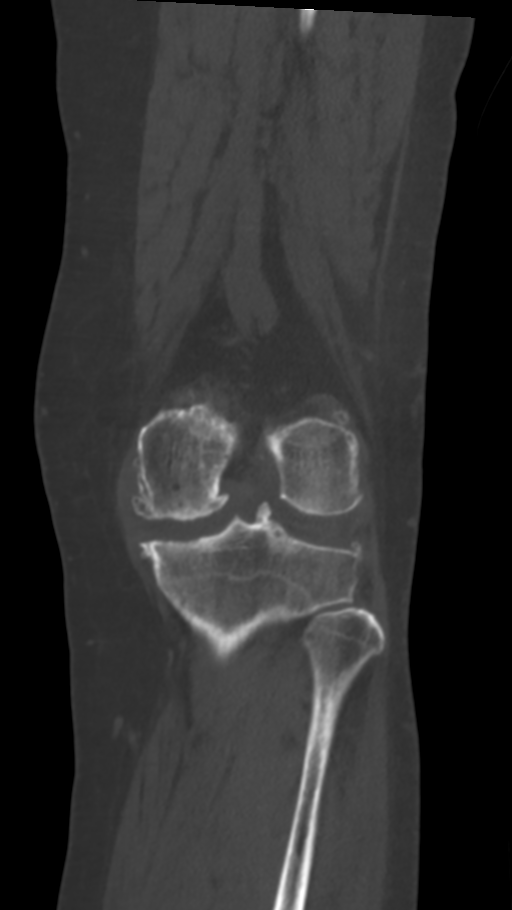

[Series 7: sagittal st · sagittal · 0.24mm/px · 5 of 73 slices shown]
[im 25/73  bone]
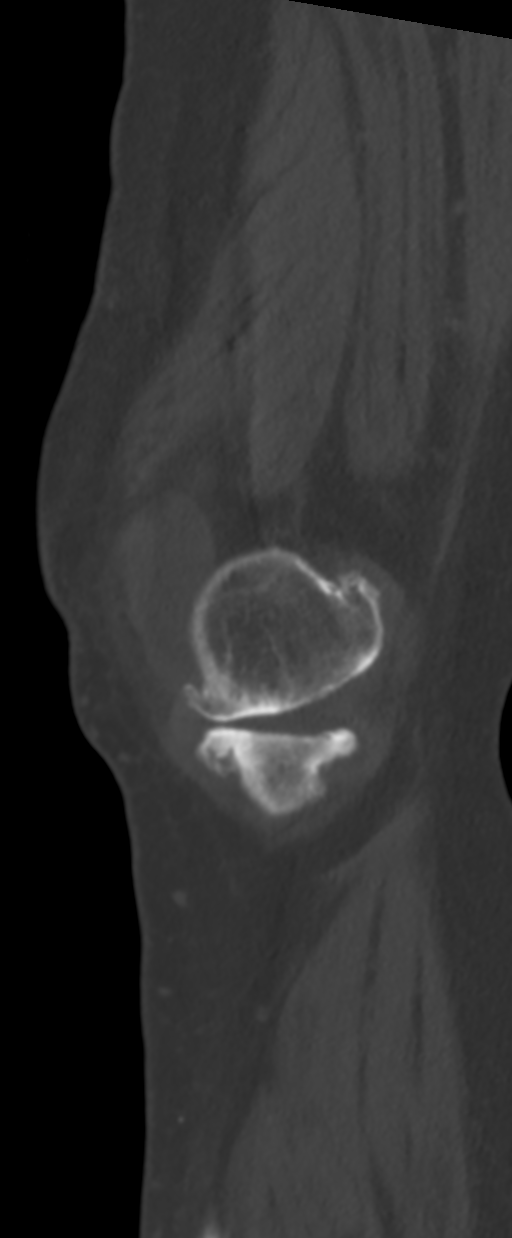
[im 31/73  bone]
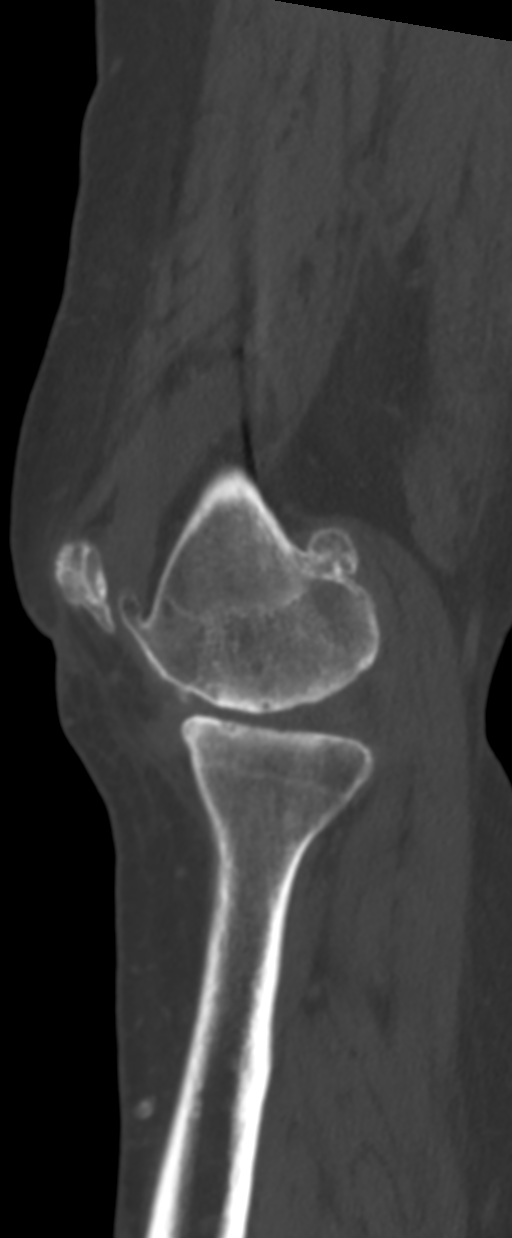
[im 37/73  bone]
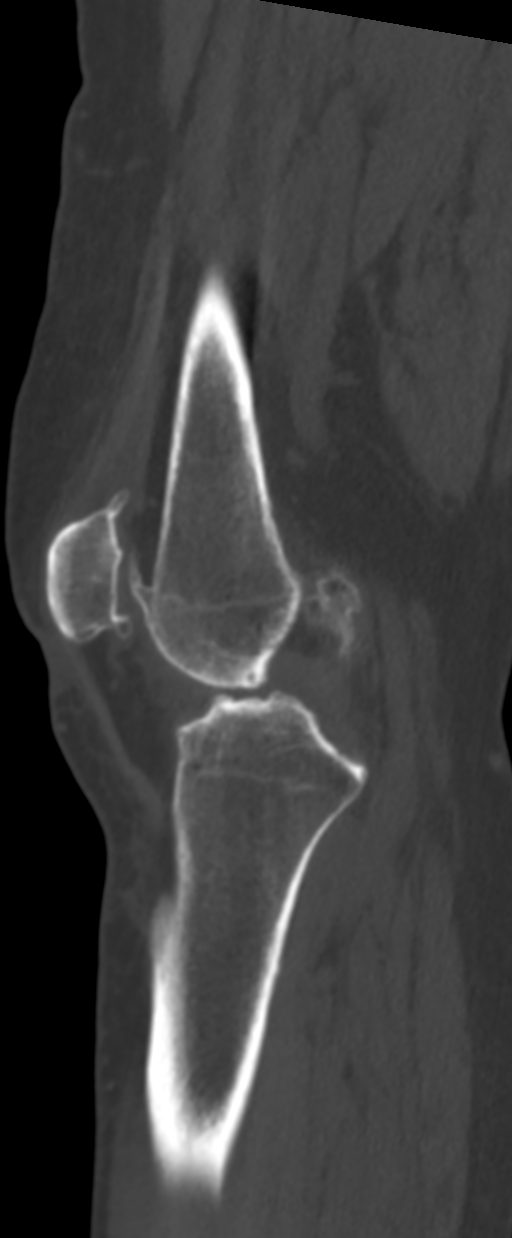
[im 43/73  bone]
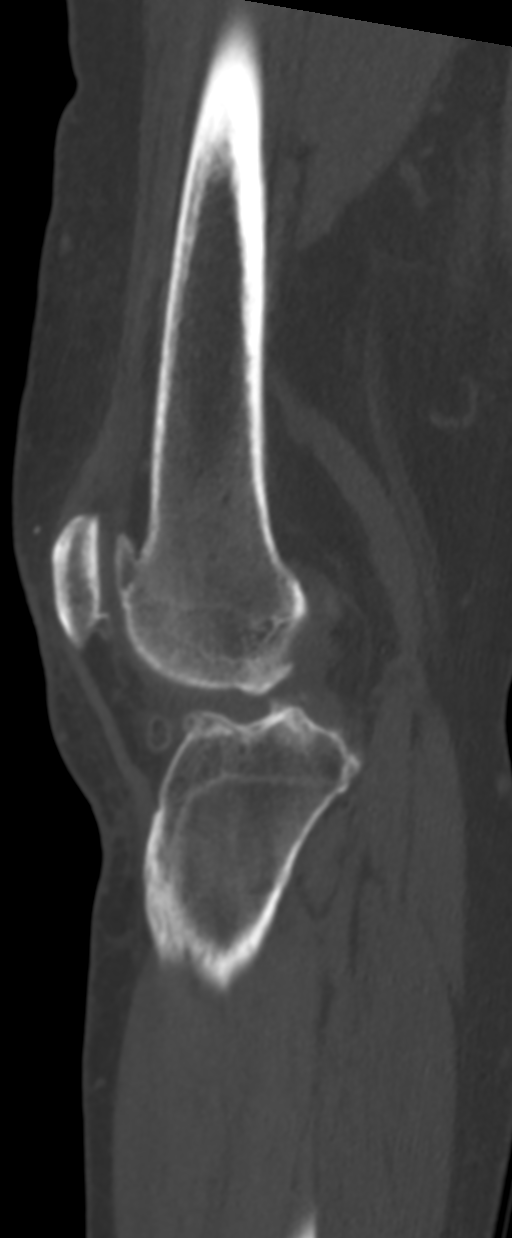
[im 49/73  bone]
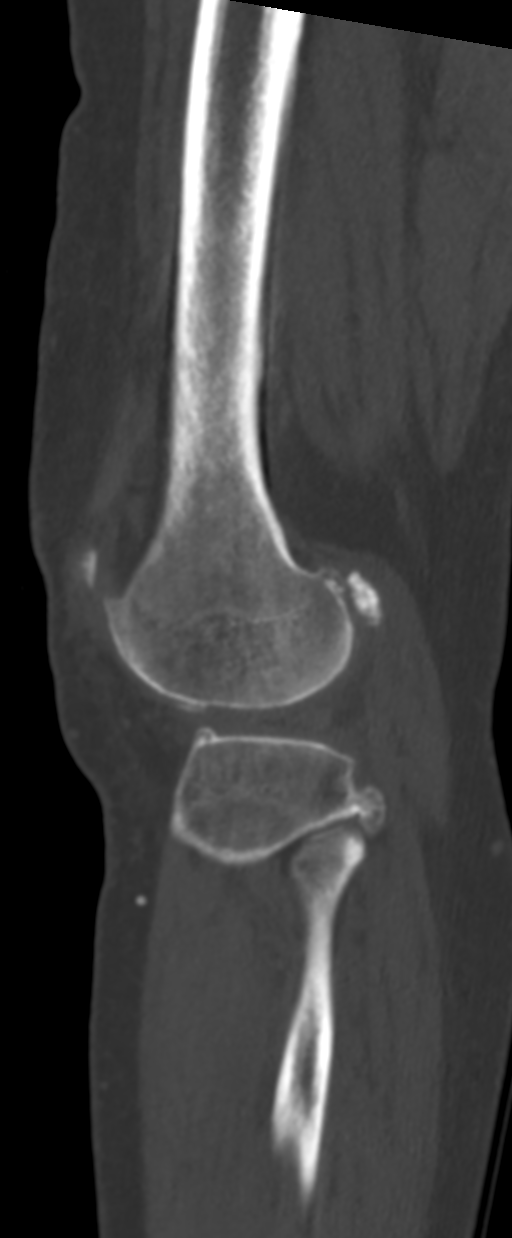

[Series 10: axial (person_name) (person_name) · axial · 0.39mm/px · z∈[+530,+530]mm · 1 of 300 slices shown, 2 images]
[im 150/300  soft-tissue]
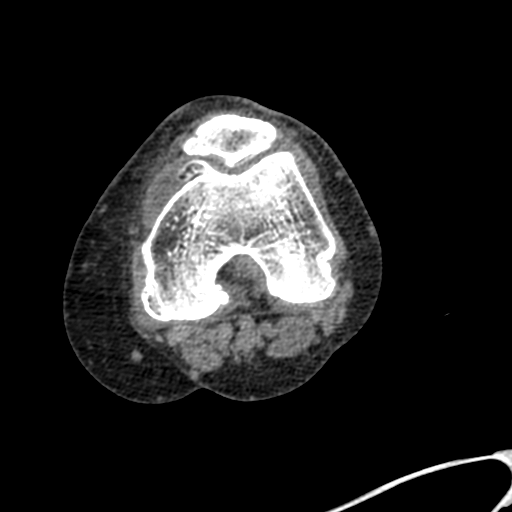
[im 150/300  bone]
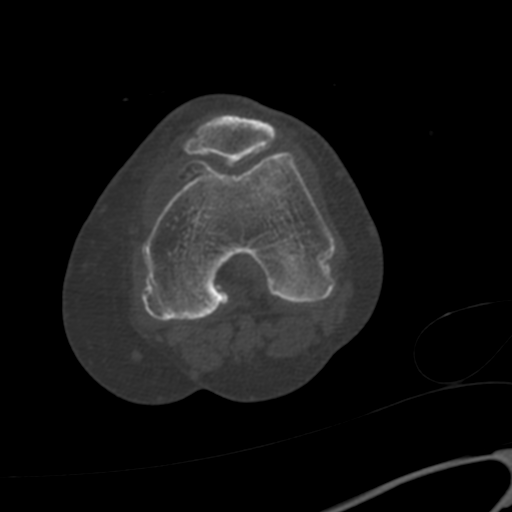

[9 of 33 positions shown; findings below may reference images not displayed]

FINDINGS: Bones/Joint/Cartilage

The hip demonstrates no fracture or dislocation. There is no lytic
or blastic lesion.

The knee demonstrates no fracture or dislocation. There is no lytic
or blastic lesion. Moderate medial and patellofemoral compartment
osteoarthritis. Mild lateral compartment osteoarthritis. 8 mm loose
body in the anterior joint space. 15 mm loose body in the posterior
joint space. Small joint effusion.

The ankle demonstrates no fracture or dislocation. There is no lytic
or blastic lesion.

Ligaments

Suboptimally assessed by CT.

Muscles and Tendons

No muscle atrophy.  The extensor mechanism is intact.

Soft tissues

No soft tissue mass.  No fluid collection or hematoma.
IMPRESSION: 1. Tricompartmental osteoarthritis, moderate in the medial and
patellofemoral compartments.

## 2019-03-23 IMAGING — DX DG KNEE 1-2V*L*
2 series · 2 of 2 positions shown · non-contrast
Comparison: CT 07/19/2018.

CLINICAL DATA: Total knee replacement.

EXAM:
LEFT KNEE - 1-2 VIEW

[knee ap]
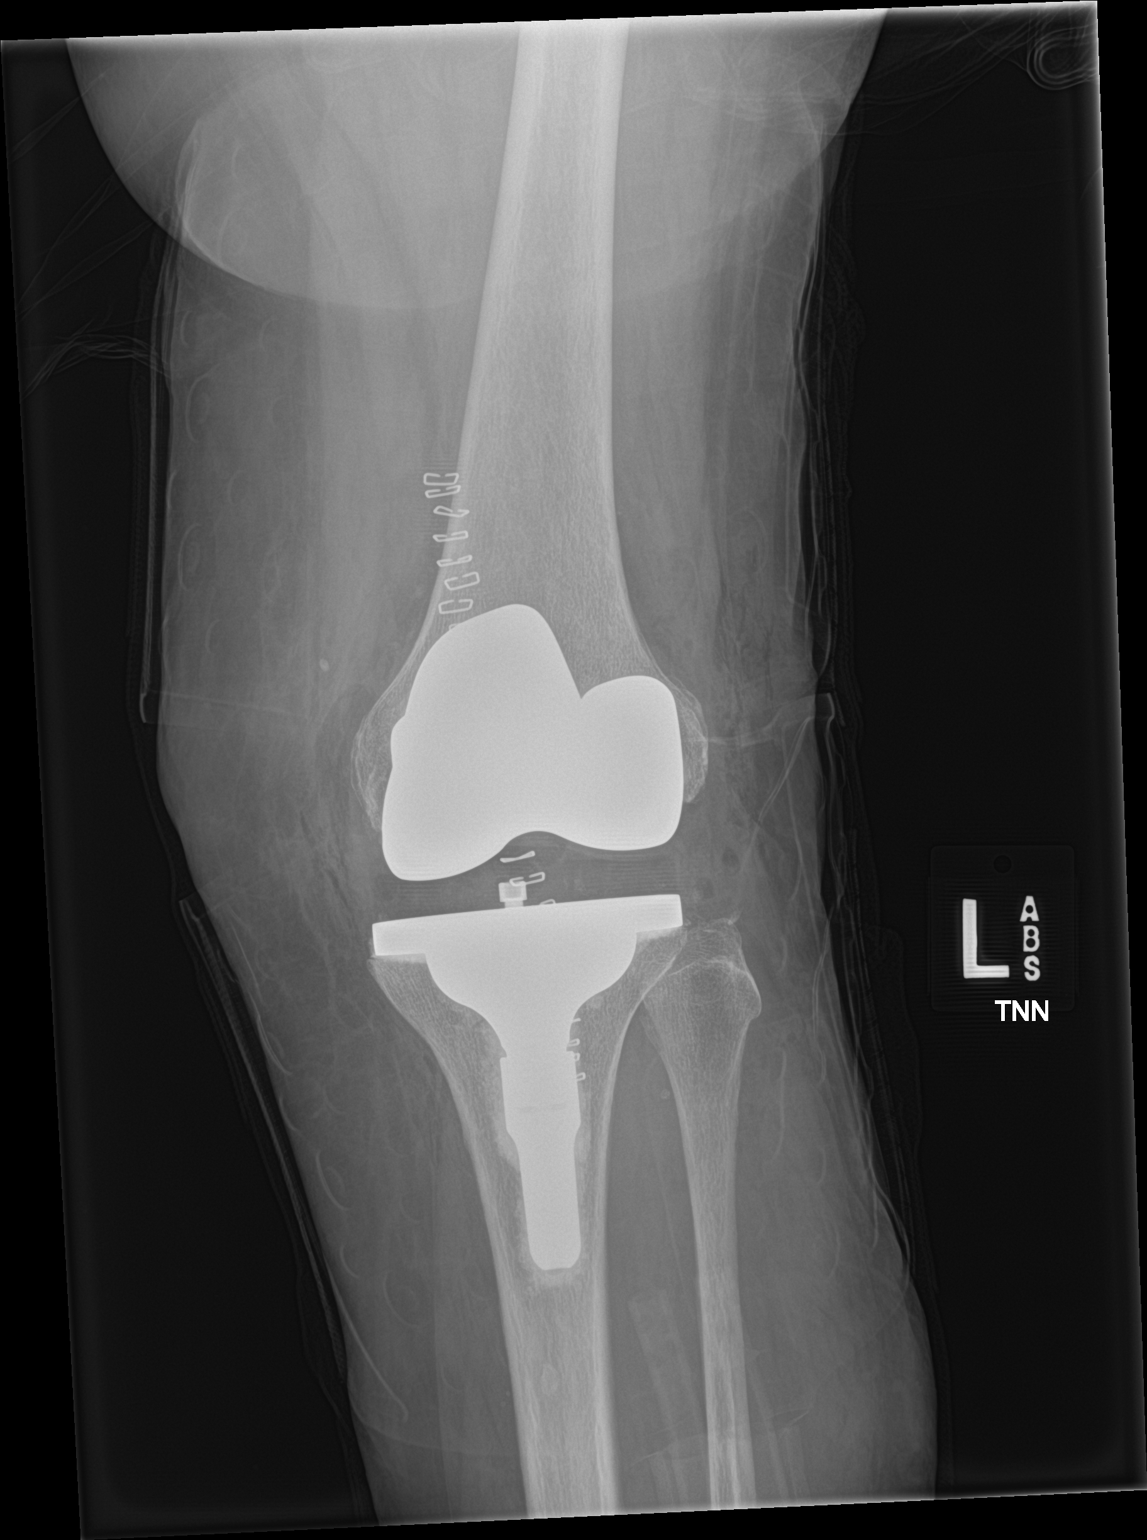

[knee lat]
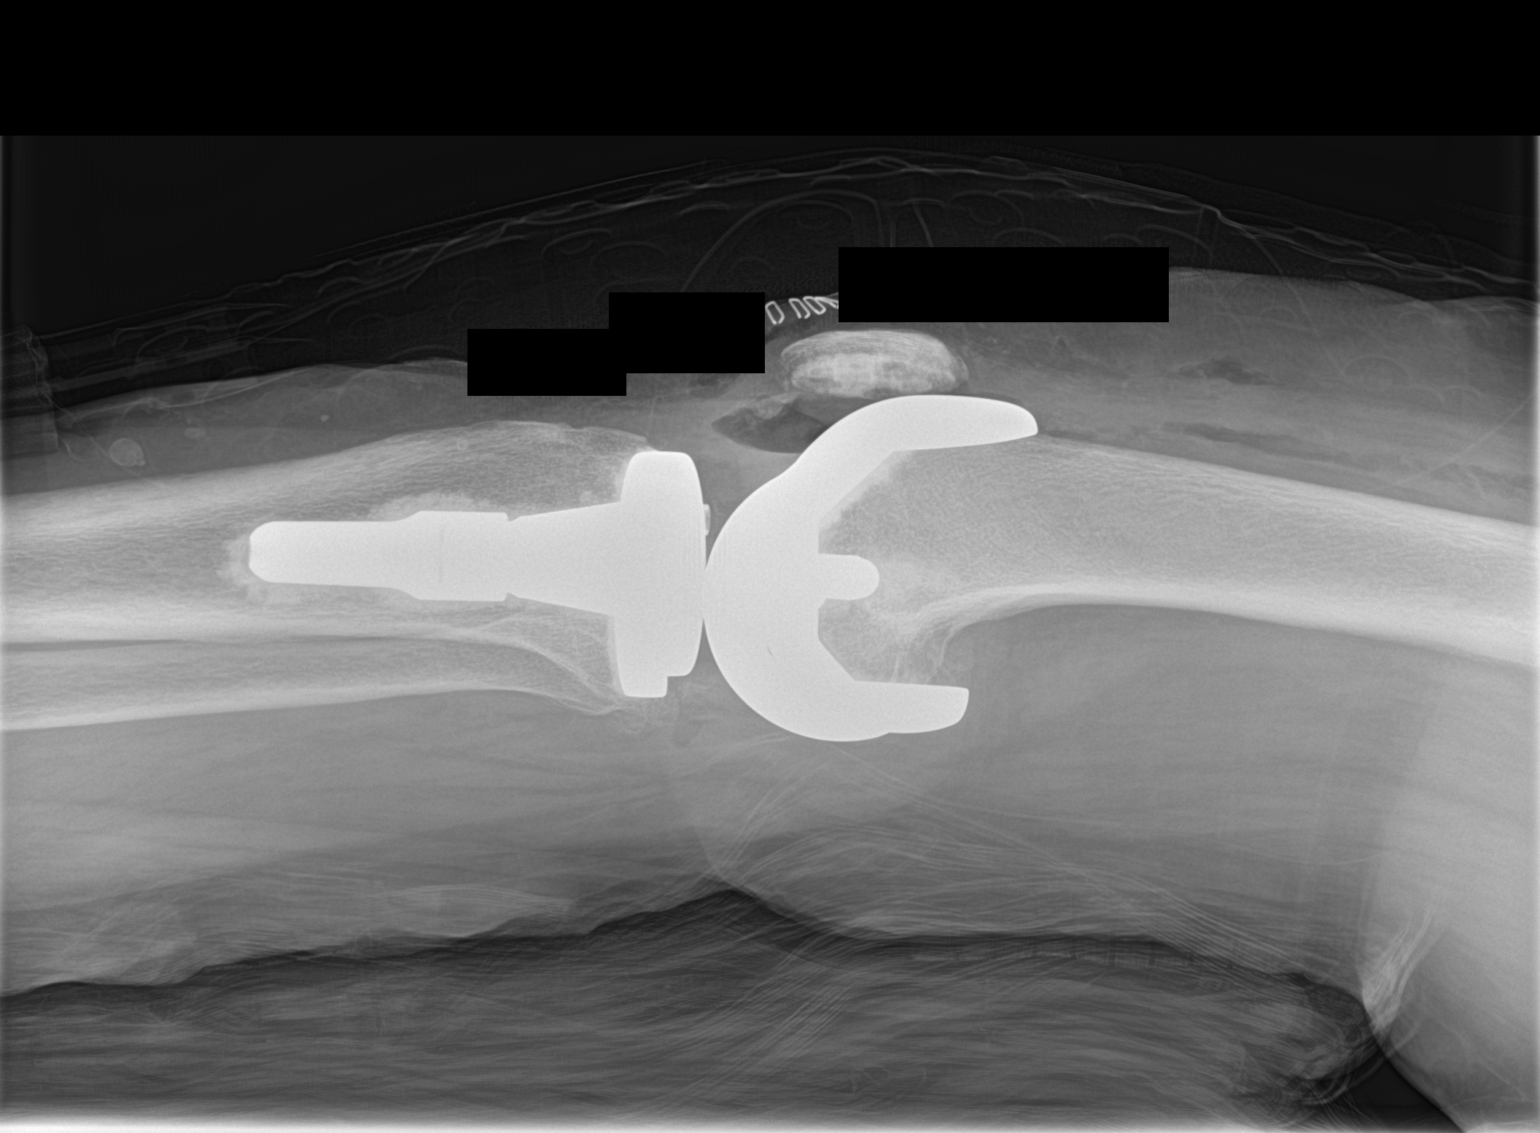

[2 of 2 positions shown; findings below may reference images not displayed]

FINDINGS: Total left knee replacement. Hardware intact. Anatomic alignment. No
acute bony abnormality.
IMPRESSION: Total left knee replacement with anatomic alignment.

## 2020-05-13 ENCOUNTER — Encounter: Payer: No Typology Code available for payment source | Attending: Physician Assistant | Admitting: Physician Assistant

## 2020-05-13 ENCOUNTER — Other Ambulatory Visit: Payer: Self-pay

## 2020-05-13 DIAGNOSIS — E11621 Type 2 diabetes mellitus with foot ulcer: Secondary | ICD-10-CM | POA: Diagnosis not present

## 2020-05-13 DIAGNOSIS — M1990 Unspecified osteoarthritis, unspecified site: Secondary | ICD-10-CM | POA: Diagnosis not present

## 2020-05-13 DIAGNOSIS — L97312 Non-pressure chronic ulcer of right ankle with fat layer exposed: Secondary | ICD-10-CM | POA: Insufficient documentation

## 2020-05-13 DIAGNOSIS — E7800 Pure hypercholesterolemia, unspecified: Secondary | ICD-10-CM | POA: Diagnosis not present

## 2020-05-13 DIAGNOSIS — E11622 Type 2 diabetes mellitus with other skin ulcer: Secondary | ICD-10-CM | POA: Diagnosis present

## 2020-05-13 DIAGNOSIS — E1140 Type 2 diabetes mellitus with diabetic neuropathy, unspecified: Secondary | ICD-10-CM | POA: Diagnosis not present

## 2020-05-13 DIAGNOSIS — I872 Venous insufficiency (chronic) (peripheral): Secondary | ICD-10-CM | POA: Insufficient documentation

## 2020-05-13 DIAGNOSIS — Z9049 Acquired absence of other specified parts of digestive tract: Secondary | ICD-10-CM | POA: Insufficient documentation

## 2020-05-13 DIAGNOSIS — F329 Major depressive disorder, single episode, unspecified: Secondary | ICD-10-CM | POA: Insufficient documentation

## 2020-05-13 NOTE — Progress Notes (Signed)
VERSA, CRATON ([id number]) Visit Report for 05/13/2020 Allergy List Details Patient Name: Tammy Brewer, Tammy T. Date of Service: 05/13/2020 9:15 AM Medical Record Number: [id number] Patient Account Number: [id number] Date of Birth/Sex: 01-10-1956 (64 y.o. F) Treating RN: Tyler Aas Primary Care Cuahutemoc Attar: Einar Crow Other Clinician: Referring Davi Rotan: Einar Crow Treating Iyanla Eilers/Extender: STONE III, HOYT Weeks in Treatment: 0 Allergies Active Allergies No Known Allergies Allergy Notes Electronic Signature(s) Signed: 05/13/2020 12:07:35 PM By: Benna Dunks Entered By: Benna Dunks on 05/13/2020 10:02:42 Koplin, Grandville Silos ([id number]) -------------------------------------------------------------------------------- Arrival Information Details Patient Name: Tammy Broom T. Date of Service: 05/13/2020 9:15 AM Medical Record Number: [id number] Patient Account Number: [id number] Date of Birth/Sex: 11-08-1955 (64 y.o. F) Treating RN: Tyler Aas Primary Care Desirae Mancusi: Einar Crow Other Clinician: Referring Shealynn Saulnier: Einar Crow Treating Rakeen Gaillard/Extender: Linwood Dibbles, HOYT Weeks in Treatment: 0 Visit Information Patient Arrived: Ambulatory Arrival Time: 09:29 Accompanied By: husband Transfer Assistance: None Patient Identification Verified: Yes Secondary Verification Process Completed: Yes History Since Last Visit Added or deleted any medications: No Any Tammy allergies or adverse reactions: No Had a fall or experienced change in activities of daily living that may affect risk of falls: No Signs or symptoms of abuse/neglect since last visito No Hospitalized since last visit: No Implantable device outside of the clinic excluding cellular tissue based products placed in the center since last visit: No Electronic Signature(s) Signed: 05/13/2020 4:27:49 PM By: Dayton Martes RCP, RRT, CHT Entered By: Dayton Martes on  05/13/2020 09:32:38 Bozzo, Sissi TMarland Kitchen ([id number]) -------------------------------------------------------------------------------- Clinic Level of Care Assessment Details Patient Name: Tammy Era, [name] Date of Service: 05/13/2020 9:15 AM Medical Record Number: [id number] Patient Account Number: [id number] Date of Birth/Sex: [birth year] (64 y.o. F) Treating RN: Tyler Aas Primary Care Matty Deamer: Einar Crow Other Clinician: Referring Kyrus Hyde: Einar Crow Treating Danasha Melman/Extender: Linwood Dibbles, HOYT Weeks in Treatment: 0 Clinic Level of Care Assessment Items TOOL 1 Quantity Score []  - Use when EandM and Procedure is performed on INITIAL visit 0 ASSESSMENTS - Nursing Assessment / Reassessment X - General Physical Exam (combine w/ comprehensive assessment (listed just below) when performed on Tammy 1 20 pt. evals) X- 1 25 Comprehensive Assessment (HX, ROS, Risk Assessments, Wounds Hx, etc.) ASSESSMENTS - Wound and Skin Assessment / Reassessment []  - Dermatologic / Skin Assessment (not related to wound area) 0 ASSESSMENTS - Ostomy and/or Continence Assessment and Care []  - Incontinence Assessment and Management 0 []  - 0 Ostomy Care Assessment and Management (repouching, etc.) PROCESS - Coordination of Care []  - Simple Patient / Family Education for ongoing care 0 X- 1 20 Complex (extensive) Patient / Family Education for ongoing care X- 1 10 Staff obtains , Records, Test Results / Process Orders []  - 0 Staff telephones HHA, Nursing Homes / Clarify orders / etc []  - 0 Routine Transfer to another Facility (non-emergent condition) []  - 0 Routine Hospital Admission (non-emergent condition) X- 1 15 Tammy Admissions / / Ordering NPWT, Apligraf, etc. []  - 0 Emergency Hospital Admission (emergent condition) PROCESS - Special Needs []  - Pediatric / Minor Patient Management 0 []  - 0 Isolation Patient Management []  - 0 Hearing / Language /  Visual special needs []  - 0 Assessment of Community assistance (transportation, D/C planning, etc.) []  - 0 Additional assistance / Altered mentation []  - 0 Support Surface(s) Assessment (bed, cushion, seat, etc.) INTERVENTIONS - Miscellaneous []  - External ear exam 0 []  - 0 Patient Transfer (multiple staff / / Similar devices) []  - 0 Simple Staple /  Suture removal (25 or less) []  - 0 Complex Staple / Suture removal (26 or more) []  - 0 Hypo/Hyperglycemic Management (do not check if billed separately) X- 1 15 Ankle / Brachial Index (ABI) - do not check if billed separately Has the patient been seen at the hospital within the last three years: Yes Total Score: 105 Level Of Care: Tammy/Established - Level 3 EARSIE, HUMM T. ( ) Electronic Signature(s) Signed: 05/13/2020 4:33:41 PM By: [id number] Entered By: 05/15/2020 on 05/13/2020 10:40:39 Hillis, Stasia T. (Tyler Aas) -------------------------------------------------------------------------------- Compression Therapy Details Patient Name: 05/15/2020 T. Date of Service: 05/13/2020 9:15 AM Medical Record Number: Tammy Broom Patient Account Number: 05/15/2020 Date of Birth/Sex: [birth year] (64 y.o. F) Treating RN: 01/20/1956 Primary Care Ijanae Macapagal: 11-17-1990 Other Clinician: Referring Hiawatha Merriott: Tyler Aas Treating Zakk Borgen/Extender: Einar Crow, HOYT Weeks in Treatment: 0 Compression Therapy Performed for Wound Assessment: Wound #2 Right,Medial Ankle Performed By: Einar Crow, RN Compression Type: Three Layer Pre Treatment ABI: 0.9 Post Procedure Diagnosis Same as Pre-procedure Electronic Signature(s) Signed: 05/13/2020 4:33:41 PM By: Alfonse Spruce Entered By: 05/15/2020 on 05/13/2020 10:40:12 Karel, Oliviya TTyler Aas (05/15/2020) -------------------------------------------------------------------------------- Encounter Discharge Information Details Patient Name:  Tammy Kitchen T. Date of Service: 05/13/2020 9:15 AM Medical Record Number: Tammy Broom Patient Account Number: 05/15/2020 Date of Birth/Sex: 1955-11-11 (64 y.o. F) Treating RN: 01/20/1956 Primary Care Rc Amison: 11-17-1990 Other Clinician: Referring Roxene Alviar: Tyler Aas Treating Iokepa Geffre/Extender: Einar Crow, HOYT Weeks in Treatment: 0 Encounter Discharge Information Items Discharge Condition: Stable Ambulatory Status: Ambulatory Discharge Destination: Home Transportation: Private Auto Accompanied By: spouse Schedule Follow-up Appointment: Yes Clinical Summary of Care: Electronic Signature(s) Signed: 05/13/2020 4:33:41 PM By: Linwood Dibbles Entered By: 05/15/2020 on 05/13/2020 10:41:24 Balinski, Tyler Aas (05/15/2020) -------------------------------------------------------------------------------- Lower Extremity Assessment Details Patient Name: Grandville Silos T. Date of Service: 05/13/2020 9:15 AM Medical Record Number: Tammy Broom Patient Account Number: 05/15/2020 Date of Birth/Sex: Jun 14, 1956 (64 y.o. F) Treating RN: 01/20/1956 Primary Care Sreekar Broyhill: 11-17-1990 Other Clinician: Referring Simone Tuckey: Tyler Aas Treating Isadore Palecek/Extender: Einar Crow, HOYT Weeks in Treatment: 0 Edema Assessment Assessed: [Left: Yes] [Right: No] Edema: [Left: No] [Right: No] Calf Left: Right: Point of Measurement: 24 cm From Medial Instep 35 cm 32 cm Ankle Left: Right: Point of Measurement: 9 cm From Medial Instep 22 cm 22.5 cm Vascular Assessment Pulses: Dorsalis Pedis Palpable: [Left:Yes] [Right:Yes] Doppler Audible: [Left:Yes] [Right:Yes] Posterior Tibial Palpable: [Left:Yes] [Right:Yes] Doppler Audible: [Left:Yes] [Right:Yes] Blood Pressure: Brachial: [Left:130] [Right:138] Dorsalis Pedis: 110 [Left:Dorsalis Pedis: 82] Ankle: Posterior Tibial: 122 [Left:Posterior Tibial: 130 0.88] [Right:0.94] Electronic Signature(s) Signed: 05/13/2020  12:07:35 PM By: Linwood Dibbles Signed: 05/13/2020 4:33:41 PM By: Benna Dunks Entered By: 05/15/2020 on 05/13/2020 10:02:30 Massoud, Waylon T. (Benna Dunks) -------------------------------------------------------------------------------- Multi Wound Chart Details Patient Name: 05/15/2020 T. Date of Service: 05/13/2020 9:15 AM Medical Record Number: Tammy Broom Patient Account Number: 05/15/2020 Date of Birth/Sex: 26-Mar-1956 (64 y.o. F) Treating RN: 01/20/1956 Primary Care Shekina Cordell: 11-17-1990 Other Clinician: Referring Rickita Forstner: Tyler Aas Treating Renaye Janicki/Extender: Einar Crow, HOYT Weeks in Treatment: 0 Vital Signs Height(in): 62 Pulse(bpm): 78 Weight(lbs): 160 Blood Pressure(mmHg): 135/74 Body Mass Index(BMI): 29 Temperature(F): 98.2 Respiratory Rate(breaths/min): 16 Photos: [N/A:N/A] Wound Location: Right, Medial Ankle N/A N/A Wounding Event: Trauma N/A N/A Primary Etiology: Trauma, Other N/A N/A Date Acquired: 01/13/2020 N/A N/A Weeks of Treatment: 0 N/A N/A Wound Status: Open N/A N/A Measurements L x W x D (cm) 1.5x1.5x0.1 N/A N/A Area (cm) : 1.767 N/A N/A Volume (cm) : 0.177 N/A N/A % Reduction in Area:  0.00% N/A N/A % Reduction in Volume: 0.00% N/A N/A Classification: Full Thickness Without Exposed N/A N/A Support Structures Exudate Amount: None Present N/A N/A Granulation Amount: Small (1-33%) N/A N/A Granulation Quality: Pink N/A N/A Necrotic Amount: Large (67-100%) N/A N/A Exposed Structures: Fat Layer (Subcutaneous Tissue): N/A N/A Yes Fascia: No Tendon: No Muscle: No Joint: No Bone: No Epithelialization: Small (1-33%) N/A N/A Assessment Notes: States she injured her ankle on a N/A N/A bucket when [place]. The area has been slow to heal and her primary MD referred her to the clinic. Treatment Notes Electronic Signature(s) Signed: 05/13/2020 4:33:41 PM By: Tyler Aas Entered By:  Tyler Aas on 05/13/2020 10:31:25 SHALISE, ROSADO ([id number]) -------------------------------------------------------------------------------- Multi-Disciplinary Care Plan Details Patient Name: WING, GFELLER T. Date of Service: 05/13/2020 9:15 AM Medical Record Number: [id number] Patient Account Number: [id number] Date of Birth/Sex: 10-19-1955 (64 y.o. F) Treating RN: Tyler Aas Primary Care Ruberta Holck: Einar Crow Other Clinician: Referring Deneice Wack: Einar Crow Treating Kaylah Chiasson/Extender: Linwood Dibbles, HOYT Weeks in Treatment: 0 Active Inactive Wound/Skin Impairment Nursing Diagnoses: Impaired tissue integrity Knowledge deficit related to ulceration/compromised skin integrity Goals: Patient/caregiver will verbalize understanding of skin care regimen Date Initiated: 05/13/2020 Target Resolution Date: 05/27/2020 Goal Status: Active Interventions: Assess patient/caregiver ability to obtain necessary supplies Assess patient/caregiver ability to perform ulcer/skin care regimen upon admission and as needed Assess ulceration(s) every visit Provide education on ulcer and skin care Treatment Activities: Skin care regimen initiated : 05/13/2020 Notes: Electronic Signature(s) Signed: 05/13/2020 4:33:41 PM By: Tyler Aas Entered By: Tyler Aas on 05/13/2020 10:31:15 Hautala, Milah T. ([id number]) -------------------------------------------------------------------------------- Pain Assessment Details Patient Name: Tammy Broom T. Date of Service: 05/13/2020 9:15 AM Medical Record Number: [id number] Patient Account Number: [id number] Date of Birth/Sex: 11-Oct-1955 (64 y.o. F) Treating RN: Tyler Aas Primary Care Ruqaya Strauss: Einar Crow Other Clinician: Referring Adel Burch: Einar Crow Treating Ndrew Creason/Extender: Linwood Dibbles, HOYT Weeks in Treatment: 0 Active Problems Location of Pain Severity and Description of Pain Patient Has Paino Yes Site  Locations Pain Location: Pain in Ulcers Rate the pain. Current Pain Level: 3 Character of Pain Describe the Pain: Burning Pain Management and Medication Current Pain Management: Notes Takes Motrin for pain with "some relief." Electronic Signature(s) Signed: 05/13/2020 12:07:35 PM By: Benna Dunks Signed: 05/13/2020 4:33:41 PM By: Tyler Aas Entered By: Benna Dunks on 05/13/2020 09:51:30 Grossi, Mardell T. ([id number]) -------------------------------------------------------------------------------- Patient/Caregiver Education Details Patient Name: Tammy Broom T. Date of Service: 05/13/2020 9:15 AM Medical Record Number: [id number] Patient Account Number: [id number] Date of Birth/Gender: [birth year] (64 y.o. F) Treating RN: Tyler Aas Primary Care Physician: Einar Crow Other Clinician: Referring Physician: Einar Crow Treating Physician/Extender: Linwood Dibbles, HOYT Weeks in Treatment: 0 Education Assessment Education Provided To: Patient Education Topics Provided Wound/Skin Impairment: Methods: Explain/Verbal Responses: State content correctly Electronic Signature(s) Signed: 05/13/2020 4:33:41 PM By: Tyler Aas Entered By: Tyler Aas on 05/13/2020 10:31:01 Helzer, Grandville Silos ([id number]) -------------------------------------------------------------------------------- Wound Assessment Details Patient Name: Tammy Broom T. Date of Service: 05/13/2020 9:15 AM Medical Record Number: [id number] Patient Account Number: [id number] Date of Birth/Sex: Mar 30, 1956 (64 y.o. F) Treating RN: Tyler Aas Primary Care Negar Sieler: Einar Crow Other Clinician: Referring Jakeel Starliper: Einar Crow Treating Ottie Neglia/Extender: Linwood Dibbles, HOYT Weeks in Treatment: 0 Wound Status Wound Number: 2 Primary Etiology: Trauma, Other Wound Location: Right, Medial Ankle Wound Status: Open Wounding Event: Trauma Date Acquired: 01/13/2020 Weeks Of  Treatment: 0 Clustered Wound: No Photos Wound Measurements Length: (cm) 1.5 Width: (cm) 1.5 Depth: (cm) 0.1 Area: (cm)  1.767 Volume: (cm) 0.177 % Reduction in Area: 0% % Reduction in Volume: 0% Epithelialization: Small (1-33%) Tunneling: No Undermining: No Wound Description Classification: Full Thickness Without Exposed Support Structures Exudate Amount: None Present Foul Odor After Cleansing: No Slough/Fibrino No Wound Bed Granulation Amount: Small (1-33%) Exposed Structure Granulation Quality: Pink Fascia Exposed: No Necrotic Amount: Large (67-100%) Fat Layer (Subcutaneous Tissue) Exposed: Yes Necrotic Quality: Adherent Slough Tendon Exposed: No Muscle Exposed: No Joint Exposed: No Bone Exposed: No Assessment Notes States she injured her ankle on a bucket when [place] was [place] past [place]. The area has been slow to heal and her primary MD referred her to the clinic. Treatment Notes Wound #2 (Right, Medial Ankle) Notes scell, abd, 3LR Bonfiglio, Tabytha T. ([id number]) Electronic Signature(s) Signed: 05/13/2020 12:07:35 PM By: Benna DunksLineberry, Carol Signed: 05/13/2020 4:33:41 PM By: Tyler AasButler, Michelle Entered By: Benna DunksLineberry, Carol on 05/13/2020 09:56:02 Penman, Maleta T. ([id number]) -------------------------------------------------------------------------------- Vitals Details Patient Name: Tammy BroomGARCIA, Makyna T. Date of Service: 05/13/2020 9:15 AM Medical Record Number: [id number] Patient Account Number: [id number] Date of Birth/Sex: [birth year] (64 y.o. F) Treating RN: Tyler AasButler, Michelle Primary Care Justene Jensen: Einar CrowAnderson, Marshall Other Clinician: Referring Bolton Canupp: Einar CrowAnderson, Marshall Treating Saroya Riccobono/Extender: Linwood DibblesSTONE III, HOYT Weeks in Treatment: 0 Vital Signs Time Taken: 09:30 Temperature (F): 98.2 Height (in): 62 Pulse (bpm): 78 Source: Stated Respiratory Rate (breaths/min): 16 Weight (lbs): 160 Blood Pressure (mmHg): 135/74 Source: Stated Reference Range: 80 -  120 mg / dl Body Mass Index (BMI): 29.3 Electronic Signature(s) Signed: 05/13/2020 12:07:35 PM By: Benna DunksLineberry, Carol Entered By: Benna DunksLineberry, Carol on 05/13/2020 09:51:43

## 2020-05-13 NOTE — Progress Notes (Signed)
ELLEEN, COULIBALY ([id number]) Visit Report for 05/13/2020 Chief Complaint Document Details Patient Name: Tammy Brewer, Tammy T. Date of Service: 05/13/2020 9:15 AM Medical Record Number: [id number] Patient Account Number: [id number] Date of Birth/Sex: Aug 06, 1956 (64 y.o. F) Treating RN: Tyler Aas Primary Care Provider: Einar Crow Other Clinician: Referring Provider: Einar Crow Treating Provider/Extender: Linwood Dibbles, Sulma Ruffino Weeks in Treatment: 0 Information Obtained from: Patient Chief Complaint Right ankle ulcer Electronic Signature(s) Signed: 05/13/2020 10:23:18 AM By: Lenda Kelp PA-C Previous Signature: 05/13/2020 10:22:55 AM Version By: Lenda Kelp PA-C Entered By: Lenda Kelp on 05/13/2020 10:23:17 [name], [name] ([id number]) -------------------------------------------------------------------------------- HPI Details Patient Name: Tammy Broom T. Date of Service: 05/13/2020 9:15 AM Medical Record Number: [id number] Patient Account Number: [id number] Date of Birth/Sex: 1956/01/01 (64 y.o. F) Treating RN: Tyler Aas Primary Care Provider: Einar Crow Other Clinician: Referring Provider: Einar Crow Treating Provider/Extender: Linwood Dibbles, Halen Antenucci Weeks in Treatment: 0 History of Present Illness HPI Description: 63 year old patient who comes from Dr. Alva Garnet office with the chief complaints of having a foot ulcer with redness for about 2 weeks. The patient has a past medical history of diabetes mellitus type 2 which is uncontrolled with neuropathy, hypercholesterolemia, osteoarthritis, major depression and has had a past history of appendectomy, cholecystectomy and a DandC. She has never been a smoker and her last hemoglobin A1c done in February 2016 was 8.2. As per our office note she has recently seen podiatry and also has some vascular workup done recently and saw Dr. Gilda Crease on 04/18/2015. Clinically they found varicose veins of  the lower extremity with both ulcers of the ankle and inflammation. There was an active ulcer on the right lower extremity. Lower extremity venous duplex exam was recommended and the patient was asked to wear compression stockings. Venous duplex studies are still pending. 05/27/2015 -- she has still not done her venous duplex studies and understand she will be traveling out of town this week and does not want to wear a compression wrap and will get herself some compression stockings. 06/10/2015 -- she is back from her travels and was wearing a compression stocking as per her history. She has not yet got her vascular workup and intervention done. 06/17/2015 -- we have always seen this patient with a Spanish-speaking interpreter but she still does not understand that the venous duplex studies have to be done or she chooses not to follow our advice. She did not keep the wound was prepped for more than 3 days and says she has been wearing compression stockings but today she comes with a Tubigrip. As far as I'm concerned this patient is not being compliant. Readmission: 05/13/2020 patient presents today for reevaluation here in the clinic concerning issues that she has been having with a wound on the right medial ankle region which is in a very similar spot where she had a wound in 2016. Subsequently she tells me that the area does hurt quite significantly guarding quite a bit. Upon inspection today patient appears to show no signs of infection which is great news. Fortunately overall she is managing things quite nicely which is also good news. Nonetheless she has been using mupirocin at this time along with just a cover gauze dressing. She does show some signs of having had improvement but again this is not completely close by any means. She does have a history of chronic venous insufficiency with varicose veins as well and diabetes mellitus type 2. Patient's ABIs are actually fairly good at this point  especially on the right at 0.94. Electronic Signature(s) Signed: 05/13/2020 4:26:36 PM By: Lenda Kelp PA-C Entered By: Lenda Kelp on 05/13/2020 16:26:36 Tammy Brewer, Tammy Brewer Tammy ([id number]) -------------------------------------------------------------------------------- Physical Exam Details Patient Name: [name], [name] Date of Service: 05/13/2020 9:15 AM Medical Record Number: [id number] Patient Account Number: [id number] Date of Birth/Sex: February 20, 1956 (64 y.o. F) Treating RN: Tyler Aas Primary Care Provider: Einar Crow Other Clinician: Referring Provider: Einar Crow Treating Provider/Extender: Linwood Dibbles, Kamrie Fanton Weeks in Treatment: 0 Constitutional sitting or standing blood pressure is within target range for patient.. pulse regular and within target range for patient.Marland Tammy respirations regular, non- labored and within target range for patient.Marland Tammy temperature within target range for patient.. Well-nourished and well-hydrated in no acute distress. Eyes conjunctiva clear no eyelid edema noted. pupils equal round and reactive to light and accommodation. Ears, Nose, Mouth, and Throat no gross abnormality of ear auricles or external auditory canals. normal hearing noted during conversation. mucus membranes moist. Respiratory normal breathing without difficulty. Cardiovascular 2+ pitting edema of the bilateral lower extremities. Musculoskeletal normal gait and posture. no significant deformity or arthritic changes, no loss or range of motion, no clubbing. Psychiatric this patient is able to make decisions and demonstrates good insight into disease process. Alert and Oriented x 3. pleasant and cooperative. Notes Patient's wound bed actually showed signs of good granulation at this time and there was some epithelization noted as well. With that being said I do not see any evidence of active infection at this point which is great news and in general I am actually very pleased  with where we stand. She does have pitting edema noted of the bilateral lower extremities which I think is part of the reason why she is not really healing as quickly as we would like to see. Electronic Signature(s) Signed: 05/13/2020 4:27:12 PM By: Lenda Kelp PA-C Entered By: Lenda Kelp on 05/13/2020 16:27:12 Tammy Brewer, Tammy Brewer Tammy ([id number]) -------------------------------------------------------------------------------- Physician Orders Details Patient Name: Tammy Broom T. Date of Service: 05/13/2020 9:15 AM Medical Record Number: [id number] Patient Account Number: [id number] Date of Birth/Sex: 26-Feb-1956 (64 y.o. F) Treating RN: Tyler Aas Primary Care Provider: Einar Crow Other Clinician: Referring Provider: Einar Crow Treating Provider/Extender: Linwood Dibbles, Gordie Belvin Weeks in Treatment: 0 Verbal / Phone Orders: No Diagnosis Coding ICD-10 Coding Code Description I87.2 Venous insufficiency (chronic) (peripheral) E11.622 Type 2 diabetes mellitus with other skin ulcer L97.312 Non-pressure chronic ulcer of right ankle with fat layer exposed Wound Cleansing Wound #2 Right,Medial Ankle o Dial antibacterial soap, wash wounds, rinse and pat dry prior to dressing wounds Anesthetic (add to Medication List) Wound #2 Right,Medial Ankle o Topical Lidocaine 4% cream applied to wound bed prior to debridement (In Clinic Only). Primary Wound Dressing Wound #2 Right,Medial Ankle o Silver Alginate Secondary Dressing Wound #2 Right,Medial Ankle o ABD pad Dressing Change Frequency Wound #2 Right,Medial Ankle o Change dressing every week Edema Control o 3 Layer Compression System - Right Lower Extremity o Elevate legs to the level of the heart and pump ankles as often as possible Consults o Vascular - venous study with provider visit Electronic Signature(s) Signed: 05/13/2020 4:30:36 PM By: Lenda Kelp PA-C Signed: 05/13/2020 4:33:41 PM By: Tyler Aas Entered By: Tyler Aas on 05/13/2020 10:39:36 Tammy Brewer, Tammy T. ([id number]) -------------------------------------------------------------------------------- Problem List Details Patient Name: Tammy Broom T. Date of Service: 05/13/2020 9:15 AM Medical Record Number: [id number] Patient Account Number: [id number] Date of Birth/Sex: Aug 20, 1956 (64 y.o. F) Treating RN: Tyler Aas Primary Care Provider: Dareen Piano,  Gaynell Face Other Clinician: Referring Provider: Einar Crow Treating Provider/Extender: Linwood Dibbles, Elita Dame Weeks in Treatment: 0 Active Problems ICD-10 Encounter Code Description Active Date MDM Diagnosis I87.2 Venous insufficiency (chronic) (peripheral) 05/13/2020 No Yes E11.622 Type 2 diabetes mellitus with other skin ulcer 05/13/2020 No Yes L97.312 Non-pressure chronic ulcer of right ankle with fat layer exposed 05/13/2020 No Yes Inactive Problems Resolved Problems Electronic Signature(s) Signed: 05/13/2020 10:22:41 AM By: Lenda Kelp PA-C Entered By: Lenda Kelp on 05/13/2020 10:22:40 Cuaresma, Bobbye T. ([id number]) -------------------------------------------------------------------------------- Progress Note Details Patient Name: Isaacson, Talli T. Date of Service: 05/13/2020 9:15 AM Medical Record Number: [id number] Patient Account Number: [id number] Date of Birth/Sex: 12/10/1955 (64 y.o. F) Treating RN: Tyler Aas Primary Care Provider: Einar Crow Other Clinician: Referring Provider: Einar Crow Treating Provider/Extender: Linwood Dibbles, Kimiko Common Weeks in Treatment: 0 Subjective Chief Complaint Information obtained from Patient Right ankle ulcer History of Present Illness (HPI) 64 year old patient who comes from Dr. Alva Garnet office with the chief complaints of having a foot ulcer with redness for about 2 weeks. The patient has a past medical history of diabetes mellitus type 2 which is uncontrolled with neuropathy,  hypercholesterolemia, osteoarthritis, major depression and has had a past history of appendectomy, cholecystectomy and a DandC. She has never been a smoker and her last hemoglobin A1c done in February 2016 was 8.2. As per our office note she has recently seen podiatry and also has some vascular workup done recently and saw Dr. Gilda Crease on 04/18/2015. Clinically they found varicose veins of the lower extremity with both ulcers of the ankle and inflammation. There was an active ulcer on the right lower extremity. Lower extremity venous duplex exam was recommended and the patient was asked to wear compression stockings. Venous duplex studies are still pending. 05/27/2015 -- she has still not done her venous duplex studies and understand she will be traveling out of town this week and does not want to wear a compression wrap and will get herself some compression stockings. 06/10/2015 -- she is back from her travels and was wearing a compression stocking as per her history. She has not yet got her vascular workup and intervention done. 06/17/2015 -- we have always seen this patient with a Spanish-speaking interpreter but she still does not understand that the venous duplex studies have to be done or she chooses not to follow our advice. She did not keep the wound was prepped for more than 3 days and says she has been wearing compression stockings but today she comes with a Tubigrip. As far as I'm concerned this patient is not being compliant. Readmission: 05/13/2020 patient presents today for reevaluation here in the clinic concerning issues that she has been having with a wound on the right medial ankle region which is in a very similar spot where she had a wound in 2016. Subsequently she tells me that the area does hurt quite significantly guarding quite a bit. Upon inspection today patient appears to show no signs of infection which is great news. Fortunately overall she is managing things quite nicely  which is also good news. Nonetheless she has been using mupirocin at this time along with just a cover gauze dressing. She does show some signs of having had improvement but again this is not completely close by any means. She does have a history of chronic venous insufficiency with varicose veins as well and diabetes mellitus type 2. Patient's ABIs are actually fairly good at this point especially on the right at 0.94. Patient  History Information obtained from Patient. Allergies No Known Allergies Family History No family history of Cancer, Diabetes, Heart Disease, Hereditary Spherocytosis, Hypertension, Kidney Disease, Lung Disease, Seizures, Stroke, Thyroid Problems, Tuberculosis. Social History Never smoker, Marital Status - Married, Alcohol Use - Never, Drug Use - No History, Caffeine Use - Never. Medical History Endocrine Patient has history of Type II Diabetes Integumentary (Skin) Denies history of History of Burn, History of pressure wounds Musculoskeletal Patient has history of Osteoarthritis Denies history of Gout, Rheumatoid Arthritis, Osteomyelitis Neurologic Patient has history of Neuropathy Denies history of Quadriplegia, Paraplegia, Seizure Disorder Oncologic Denies history of Received Chemotherapy, Received Radiation Patient is treated with Oral Agents. Blood sugar results noted at the following times: Breakfast - yes. Hospitalization/Surgery History - knee replacements in 2020 and 2021. Medical And Surgical History Notes Tammy Brewer, Tammy T. ([id number]) Cardiovascular hypercholesterolemia Review of Systems (ROS) Constitutional Symptoms (General Health) Denies complaints or symptoms of Fatigue, Fever, Chills, Marked Weight Change. Eyes Denies complaints or symptoms of Dry Eyes, Vision Changes, Glasses / Contacts. Ear/Nose/Mouth/Throat Denies complaints or symptoms of Difficult clearing ears, Sinusitis. Hematologic/Lymphatic Denies complaints or symptoms of Bleeding  / Clotting Disorders, Human Immunodeficiency Virus. Respiratory Denies complaints or symptoms of Chronic or frequent coughs, Shortness of Breath. Cardiovascular Denies complaints or symptoms of Chest pain, LE edema. Gastrointestinal Denies complaints or symptoms of Frequent diarrhea, Nausea, Vomiting. Endocrine Denies complaints or symptoms of Hepatitis, Thyroid disease, Polydypsia (Excessive Thirst). Genitourinary Denies complaints or symptoms of Kidney failure/ Dialysis, Incontinence/dribbling. Immunological Denies complaints or symptoms of Hives, Itching. Integumentary (Skin) Complains or has symptoms of Wounds - trauma wound on Right malleolus, Swelling - some swelling in right ankle. Denies complaints or symptoms of Bleeding or bruising tendency, Breakdown. Musculoskeletal Denies complaints or symptoms of Muscle Pain, Muscle Weakness. Neurologic Denies complaints or symptoms of Numbness/parasthesias, Focal/Weakness. Objective Constitutional sitting or standing blood pressure is within target range for patient.. pulse regular and within target range for patient.Marland Tammy. respirations regular, non- labored and within target range for patient.Marland Tammy. temperature within target range for patient.. Well-nourished and well-hydrated in no acute distress. Vitals Time Taken: 9:30 AM, Height: 62 in, Source: Stated, Weight: 160 lbs, Source: Stated, BMI: 29.3, Temperature: 98.2 F, Pulse: 78 bpm, Respiratory Rate: 16 breaths/min, Blood Pressure: 135/74 mmHg. Eyes conjunctiva clear no eyelid edema noted. pupils equal round and reactive to light and accommodation. Ears, Nose, Mouth, and Throat no gross abnormality of ear auricles or external auditory canals. normal hearing noted during conversation. mucus membranes moist. Respiratory normal breathing without difficulty. Cardiovascular 2+ pitting edema of the bilateral lower extremities. Musculoskeletal normal gait and posture. no significant deformity or  arthritic changes, no loss or range of motion, no clubbing. Psychiatric this patient is able to make decisions and demonstrates good insight into disease process. Alert and Oriented x 3. pleasant and cooperative. General Notes: Patient's wound bed actually showed signs of good granulation at this time and there was some epithelization noted as well. With that being said I do not see any evidence of active infection at this point which is great news and in general I am actually very pleased with where we stand. She does have pitting edema noted of the bilateral lower extremities which I think is part of the reason why she is not really healing as quickly as we would like to see. Integumentary (Hair, Skin) Wound #2 status is Open. Original cause of wound was Trauma. The wound is located on the Right,Medial Ankle. The wound measures 1.5cm length x 1.5cm  width x 0.1cm depth; 1.767cm^2 area and 0.177cm^3 volume. There is Fat Layer (Subcutaneous Tissue) exposed. There is no tunneling or undermining noted. There is a none present amount of drainage noted. There is small (1-33%) pink granulation within the wound Tammy Brewer, Tammy T. ([id number]) bed. There is a large (67-100%) amount of necrotic tissue within the wound bed including Adherent Slough. General Notes: States she injured her ankle on a bucket when she was out of the country this past May. The area has been slow to heal and her primary MD referred her to the clinic. Assessment Active Problems ICD-10 Venous insufficiency (chronic) (peripheral) Type 2 diabetes mellitus with other skin ulcer Non-pressure chronic ulcer of right ankle with fat layer exposed Procedures Wound #2 Pre-procedure diagnosis of Wound #2 is a Trauma, Other located on the Right,Medial Ankle . There was a Three Layer Compression Therapy Procedure with a pre-treatment ABI of 0.9 by Tyler Aas, RN. Post procedure Diagnosis Wound #2: Same as Pre-Procedure Plan Wound  Cleansing: Wound #2 Right,Medial Ankle: Dial antibacterial soap, wash wounds, rinse and pat dry prior to dressing wounds Anesthetic (add to Medication List): Wound #2 Right,Medial Ankle: Topical Lidocaine 4% cream applied to wound bed prior to debridement (In Clinic Only). Primary Wound Dressing: Wound #2 Right,Medial Ankle: Silver Alginate Secondary Dressing: Wound #2 Right,Medial Ankle: ABD pad Dressing Change Frequency: Wound #2 Right,Medial Ankle: Change dressing every week Edema Control: 3 Layer Compression System - Right Lower Extremity Elevate legs to the level of the heart and pump ankles as often as possible Consults ordered were: Vascular - venous study with provider visit 1. I would recommend at this time that we initiate treatment with a silver alginate dressing I think this is can be the best thing for her. 2. I am also can recommend that we go ahead and initiate treatment with a compression wrap I recommend a 3 layer compression wrap so will not be too tight to cause pain but fortunately should help to cut back on some of the lower extremity edema helping this to heal more effectively and quickly. 3. Muscle can recommend at this time that the patient continue to monitor for any signs of worsening infection in general obviously I see no signs of infection right now we will keep an eye on this as we change her wrap but right now I feel like things are doing quite well. 4. I do think having her see the vascular physician for further evaluation and treatment would be ideal as well and I would go ahead and make that referral for her at this point. That would be for venous studies as well as an actual provider evaluation to discuss any options in that regard. We will see patient back for reevaluation in 1 week here in the clinic. If anything worsens or changes patient will contact our office for additional recommendations. Electronic Signature(s) Signed: 05/13/2020 4:28:13 PM  By: Edison Simon, Rox T. ([id number]) Entered By: Lenda Kelp on 05/13/2020 16:28:12 Tammy Brewer, Tammy Brewer ([id number]) -------------------------------------------------------------------------------- ROS/PFSH Details Patient Name: Tammy Broom T. Date of Service: 05/13/2020 9:15 AM Medical Record Number: [id number] Patient Account Number: [id number] Date of Birth/Sex: 04/05/1956 (64 y.o. F) Treating RN: Tyler Aas Primary Care Provider: Einar Crow Other Clinician: Referring Provider: Einar Crow Treating Provider/Extender: Linwood Dibbles, Markeeta Scalf Weeks in Treatment: 0 Information Obtained From Patient Constitutional Symptoms (General Health) Complaints and Symptoms: Negative for: Fatigue; Fever; Chills; Marked Weight Change Eyes Complaints and Symptoms: Negative for: Dry Eyes; Vision  Changes; Glasses / Contacts Ear/Nose/Mouth/Throat Complaints and Symptoms: Negative for: Difficult clearing ears; Sinusitis Hematologic/Lymphatic Complaints and Symptoms: Negative for: Bleeding / Clotting Disorders; Human Immunodeficiency Virus Respiratory Complaints and Symptoms: Negative for: Chronic or frequent coughs; Shortness of Breath Cardiovascular Complaints and Symptoms: Negative for: Chest pain; LE edema Medical History: Past Medical History Notes: hypercholesterolemia Gastrointestinal Complaints and Symptoms: Negative for: Frequent diarrhea; Nausea; Vomiting Endocrine Complaints and Symptoms: Negative for: Hepatitis; Thyroid disease; Polydypsia (Excessive Thirst) Medical History: Positive for: Type II Diabetes Time with diabetes: 10 years Treated with: Oral agents Blood sugar testing results: Breakfast: yes Genitourinary Complaints and Symptoms: Negative for: Kidney failure/ Dialysis; Incontinence/dribbling Tammy Brewer, Tammy T. ([id number]) Immunological Complaints and Symptoms: Negative for: Hives; Itching Integumentary (Skin) Complaints and  Symptoms: Positive for: Wounds - trauma wound on Right malleolus; Swelling - some swelling in right ankle Negative for: Bleeding or bruising tendency; Breakdown Medical History: Negative for: History of Burn; History of pressure wounds Musculoskeletal Complaints and Symptoms: Negative for: Muscle Pain; Muscle Weakness Medical History: Positive for: Osteoarthritis Negative for: Gout; Rheumatoid Arthritis; Osteomyelitis Neurologic Complaints and Symptoms: Negative for: Numbness/parasthesias; Focal/Weakness Medical History: Positive for: Neuropathy Negative for: Quadriplegia; Paraplegia; Seizure Disorder Oncologic Medical History: Negative for: Received Chemotherapy; Received Radiation Immunizations Pneumococcal Vaccine: Received Pneumococcal Vaccination: No Tetanus Vaccine: Last tetanus shot: 08/28/2019 Implantable Devices None Hospitalization / Surgery History Type of Hospitalization/Surgery knee replacements in 2020 and 2021 Family and Social History Cancer: No; Diabetes: No; Heart Disease: No; Hereditary Spherocytosis: No; Hypertension: No; Kidney Disease: No; Lung Disease: No; Seizures: No; Stroke: No; Thyroid Problems: No; Tuberculosis: No; Never smoker; Marital Status - Married; Alcohol Use: Never; Drug Use: No History; Caffeine Use: Never; Financial Concerns: No; Food, Clothing or Shelter Needs: No; Support System Lacking: No; Transportation Concerns: No Electronic Signature(s) Signed: 05/13/2020 12:07:35 PM By: Benna Dunks Signed: 05/13/2020 4:30:36 PM By: Lenda Kelp PA-C Signed: 05/13/2020 4:33:41 PM By: Tyler Aas Entered By: Benna Dunks on 05/13/2020 10:08:29 Tammy Brewer, Tammy Brewer ([id number]) -------------------------------------------------------------------------------- SuperBill Details Patient Name: Tammy Brewer, Tammy T. Date of Service: 05/13/2020 Medical Record Number: [id number] Patient Account Number: [id number] Date of Birth/Sex: 24-Sep-1955 (64 y.o.  F) Treating RN: Tyler Aas Primary Care Provider: Einar Crow Other Clinician: Referring Provider: Einar Crow Treating Provider/Extender: Linwood Dibbles, Capone Schwinn Weeks in Treatment: 0 Diagnosis Coding ICD-10 Codes Code Description I87.2 Venous insufficiency (chronic) (peripheral) E11.622 Type 2 diabetes mellitus with other skin ulcer L97.312 Non-pressure chronic ulcer of right ankle with fat layer exposed Facility Procedures CPT4 Code: 08657846 Description: 99213 - WOUND CARE VISIT-LEV 3 EST PT Modifier: Quantity: 1 CPT4 Code: [id number] Description: (Facility Use Only) [phone] - APPLY MULTLAY COMPRS LWR RT LEG Modifier: Quantity: 1 Physician Procedures CPT4 Code: [id number] Description: WC PHYS LEVEL 3 o NEW PT Modifier: Quantity: 1 CPT4 Code: Description: ICD-10 Diagnosis Description I87.2 Venous insufficiency (chronic) (peripheral) E11.622 Type 2 diabetes mellitus with other skin ulcer L97.312 Non-pressure chronic ulcer of right ankle with fat layer exposed Modifier: Quantity: Electronic Signature(s) Signed: 05/13/2020 4:28:31 PM By: Lenda Kelp PA-C Entered By: Lenda Kelp on 05/13/2020 16:28:31

## 2020-05-13 NOTE — Progress Notes (Signed)
ILHAN, DEBENEDETTO ([id number]) Visit Report for 05/13/2020 Abuse/Suicide Risk Screen Details Patient Name: ZAINAB, CRUMRINE. Date of Service: 05/13/2020 9:15 AM Medical Record Number: [id number] Patient Account Number: [id number] Date of Birth/Sex: Dec 28, 1955 (64 y.o. F) Treating RN: Tyler Aas Primary Care Delvin Hedeen: Einar Crow Other Clinician: Referring Gelila Well: Einar Crow Treating Addelynn Batte/Extender: Linwood Dibbles, HOYT Weeks in Treatment: 0 Abuse/Suicide Risk Screen Items Answer ABUSE RISK SCREEN: Has anyone close to you tried to hurt or harm you recentlyo No Do you feel uncomfortable with anyone in your familyo No Has anyone forced you do things that you didnot want to doo No Electronic Signature(s) Signed: 05/13/2020 12:07:35 PM By: Benna Dunks Signed: 05/13/2020 4:33:41 PM By: Tyler Aas Entered By: Benna Dunks on 05/13/2020 10:09:07 Rease, Armetta T. ([id number]) -------------------------------------------------------------------------------- Activities of Daily Living Details Patient Name: LYNDE, LUDWIG T. Date of Service: 05/13/2020 9:15 AM Medical Record Number: [id number] Patient Account Number: [id number] Date of Birth/Sex: Jan 04, 1956 (64 y.o. F) Treating RN: Tyler Aas Primary Care Ezana Hubbert: Einar Crow Other Clinician: Referring Draper Gallon: Einar Crow Treating Winifred Bodiford/Extender: Linwood Dibbles, HOYT Weeks in Treatment: 0 Activities of Daily Living Items Answer Activities of Daily Living (Please select one for each item) Drive Automobile Not Able Take Medications Completely Able Use Telephone Completely Able Care for Appearance Completely Able Use Toilet Completely Able Bath / Shower Completely Able Dress Self Completely Able Feed Self Completely Able Walk Completely Able Get In / Out Bed Completely Able Housework Completely Able Prepare Meals Completely Able Handle Money Completely Able Shop for Self Completely  Able Electronic Signature(s) Signed: 05/13/2020 12:07:35 PM By: Benna Dunks Signed: 05/13/2020 4:33:41 PM By: Tyler Aas Entered By: Benna Dunks on 05/13/2020 10:10:27 Bortner, Grandville Silos ([id number]) -------------------------------------------------------------------------------- Education Screening Details Patient Name: [name] Date of Service: 05/13/2020 9:15 AM Medical Record Number: [id number] Patient Account Number: [id number] Date of Birth/Sex: 1956-06-08 (64 y.o. 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 Memory Deficit: No Emotional Barrier: No Cultural/Religious Beliefs Affecting Medical Care: No Physical Barrier Impaired Vision: No Impaired Hearing: No Decreased Hand dexterity: No Knowledge/Comprehension Knowledge Level: Medium Comprehension Level: Medium Ability to understand written instructions: Medium Ability to understand verbal instructions: Medium Motivation Anxiety Level: Calm Cooperation: Cooperative Education Importance: Acknowledges Need Interest in Health Problems: Asks Questions Perception: Coherent Willingness to Engage in Self-Management High Activities: Readiness to Engage in Self-Management High Activities: Electronic Signature(s) Signed: 05/13/2020 12:07:35 PM By: Benna Dunks Signed: 05/13/2020 4:33:41 PM By: Tyler Aas Entered By: Benna Dunks on 05/13/2020 10:11:38 Brashears, Grandville Silos ([id number]) -------------------------------------------------------------------------------- Fall Risk Assessment Details Patient Name: Potocki, Yaneth  T. Date of Service: 05/13/2020 9:15 AM Medical Record Number: [id number] Patient Account Number: [id number] Date of Birth/Sex: Mar 11, 1956 (64 y.o. F) Treating RN: Tyler Aas Primary Care Maynard David: Einar Crow Other Clinician: Referring Valdis Bevill: Einar Crow Treating Zela Sobieski/Extender: Linwood Dibbles, HOYT Weeks in Treatment: 0 Fall Risk Assessment Items Have you had 2 or more falls in the last 12 monthso 0 No Have you had any fall that resulted in injury in the last 12 monthso 0 No FALLS RISK SCREEN History of falling - immediate or within 3 months 0 No Secondary diagnosis (Do you have 2 or more medical diagnoseso) 0 No Ambulatory aid None/bed rest/wheelchair/nurse 0 No Crutches/cane/walker 0 No Furniture 0 No  Intravenous therapy Access/Saline/Heparin Lock 0 No Gait/Transferring Normal/ bed rest/ wheelchair 0 No Weak (short steps with or without shuffle, stooped but able to lift head while walking, may 0 No seek support from furniture) Impaired (short steps with shuffle, may have difficulty arising from chair, head down, impaired 0 No balance) Mental Status Oriented to own ability 0 Yes Electronic Signature(s) Signed: 05/13/2020 12:07:35 PM By: Benna Dunks Signed: 05/13/2020 4:33:41 PM By: Tyler Aas Entered By: Benna Dunks on 05/13/2020 10:11:57 Zavada, Vona T. ([id number]) -------------------------------------------------------------------------------- Foot Assessment Details Patient Name: [name] Date of Service: 05/13/2020 9:15 AM Medical Record Number: [id number] Patient Account Number: [id number] Date of Birth/Sex: 1956/02/22 (64 y.o. F) Treating RN: Tyler Aas Primary Care Tarl Cephas: Einar Crow Other Clinician: Referring Oris Staffieri: Einar Crow Treating Daisha Filosa/Extender: Linwood Dibbles, HOYT Weeks in Treatment: 0 Foot Assessment Items Site Locations + = Sensation present, - = Sensation absent, C = Callus, U =  Ulcer R = Redness, W = Warmth, M = Maceration, PU = Pre-ulcerative lesion F = Fissure, S = Swelling, D = Dryness Assessment Right: Left: Other Deformity: No No Prior Foot Ulcer: No No Prior Amputation: No No Charcot Joint: No No Ambulatory Status: Ambulatory Without Help Gait: Steady Electronic Signature(s) Signed: 05/13/2020 12:07:35 PM By: Benna Dunks Signed: 05/13/2020 4:33:41 PM By: Tyler Aas Entered By: Benna Dunks on 05/13/2020 10:13:18 Winslow, Fanny T. ([id number]) -------------------------------------------------------------------------------- Nutrition Risk Screening Details Patient Name: Faller, Euna T. Date of Service: 05/13/2020 9:15 AM Medical Record Number: [id number] Patient Account Number: [id number] Date of Birth/Sex: 1955/11/16 (64 y.o. F) Treating RN: Tyler Aas Primary Care Jahnay Lantier: Einar Crow Other Clinician: Referring Joakim Huesman: Einar Crow Treating Ellsie Violette/Extender: Linwood Dibbles, HOYT Weeks in Treatment: 0 Height (in): 62 Weight (lbs): 160 Body Mass Index (BMI): 29.3 Nutrition Risk Screening Items Score Screening NUTRITION RISK SCREEN: I have an illness or condition that made me change the kind and/or amount of food I eat 0 No I eat fewer than two meals per day 0 No I eat few fruits and vegetables, or milk products 0 No I have three or more drinks of beer, liquor or wine almost every day 0 No I have tooth or mouth problems that make it hard for me to eat 0 No I don't always have enough money to buy the food I need 0 No I eat alone most of the time 0 No I take three or more different prescribed or over-the-counter drugs a day 1 Yes Without wanting to, I have lost or gained 10 pounds in the last six months 0 No I am not always physically able to shop, cook and/or feed myself 0 No Nutrition Protocols Good Risk Protocol 0 No interventions needed Moderate Risk Protocol High Risk Proctocol Risk Level: Good Risk Score:  1 Electronic Signature(s) Signed: 05/13/2020 12:07:35 PM By: Benna Dunks Signed: 05/13/2020 4:33:41 PM By: Tyler Aas Entered By: Benna Dunks on 05/13/2020 10:12:42

## 2020-05-20 ENCOUNTER — Ambulatory Visit: Payer: No Typology Code available for payment source | Admitting: Physician Assistant

## 2020-05-22 ENCOUNTER — Encounter: Payer: No Typology Code available for payment source | Admitting: Internal Medicine

## 2020-05-22 ENCOUNTER — Other Ambulatory Visit: Payer: Self-pay

## 2020-05-22 DIAGNOSIS — E11622 Type 2 diabetes mellitus with other skin ulcer: Secondary | ICD-10-CM | POA: Diagnosis not present

## 2020-05-22 NOTE — Progress Notes (Signed)
SHANIQUIA, BRAFFORD ([id number]) Visit Report for 05/22/2020 Debridement Details Patient Name: Tammy Brewer, Tammy T. Date of Service: 05/22/2020 8:15 AM Medical Record Number: [id number] Patient Account Number: [id number] Date of Birth/Sex: 10-01-1955 (64 y.o. F) Treating RN: Huel Coventry Primary Care Provider: Einar Crow Other Clinician: Referring Provider: Einar Crow Treating Provider/Extender: Altamese [place] in Treatment: 1 Debridement Performed for Wound #2 Right,Medial Ankle Assessment: Performed By: Physician Maxwell Caul, MD Debridement Type: Debridement Level of Consciousness (Pre- Awake and Alert procedure): Pre-procedure Verification/Time Out Yes - 08:25 Taken: Start Time: 08:40 Pain Control: Lidocaine Total Area Debrided (L x W): 0.9 (cm) x 1 (cm) = 0.9 (cm) Tissue and other material Viable, Non-Viable, Callus, Eschar, Slough, Skin: Dermis , Skin: Epidermis, Slough debrided: Level: Skin/Epidermis Debridement Description: Selective/Open Wound Instrument: Curette Bleeding: Minimum Hemostasis Achieved: Pressure End Time: 08:45 Response to Treatment: Procedure was tolerated well Level of Consciousness (Post- Awake and Alert procedure): Post Debridement Measurements of Total Wound Length: (cm) 0.9 Width: (cm) 1 Depth: (cm) 0.1 Volume: (cm) 0.071 Character of Wound/Ulcer Post Debridement: Stable Post Procedure Diagnosis Same as Pre-procedure Electronic Signature(s) Signed: 05/22/2020 3:50:05 PM By: Elliot Gurney, [name], RN, CWS, [name], [name] Signed: 05/22/2020 4:16:06 PM By: Baltazar Najjar MD Entered By: Baltazar Najjar on 05/22/2020 08:50:11 Stong, [place] T. ([id number]) -------------------------------------------------------------------------------- HPI Details Patient Name: Tammy Broom T. Date of Service: 05/22/2020 8:15 AM Medical Record Number: [id number] Patient Account Number: [id number] Date of Birth/Sex: Jan 13, 1956 (64 y.o. F) Treating RN:  Huel Coventry Primary Care Provider: Einar Crow Other Clinician: Referring Provider: Einar Crow Treating Provider/Extender: Altamese [place] in Treatment: 1 History of Present Illness HPI Description: 64 year old patient who comes from Dr. Alva Garnet office with the chief complaints of having a foot ulcer with redness for about 2 weeks. The patient has a past medical history of diabetes mellitus type 2 which is uncontrolled with neuropathy, hypercholesterolemia, osteoarthritis, major depression and has had a past history of appendectomy, cholecystectomy and a DandC. She has never been a smoker and her last hemoglobin A1c done in February 2016 was 8.2. As per our office note she has recently seen podiatry and also has some vascular workup done recently and saw Dr. Gilda Crease on 04/18/2015. Clinically they found varicose veins of the lower extremity with both ulcers of the ankle and inflammation. There was an active ulcer on the right lower extremity. Lower extremity venous duplex exam was recommended and the patient was asked to wear compression stockings. Venous duplex studies are still pending. 05/27/2015 -- she has still not done her venous duplex studies and understand she will be traveling out of town this week and does not want to wear a compression wrap and will get herself some compression stockings. 06/10/2015 -- she is back from her travels and was wearing a compression stocking as per her history. She has not yet got her vascular workup and intervention done. 06/17/2015 -- we have always seen this patient with a Spanish-speaking interpreter but she still does not understand that the venous duplex studies have to be done or she chooses not to follow our advice. She did not keep the wound was prepped for more than 3 days and says she has been wearing compression stockings but today she comes with a Tubigrip. As far as I'm concerned this patient is not being  compliant. Readmission: 05/13/2020 patient presents today for reevaluation here in the clinic concerning issues that she has been having with a wound on the right medial ankle region which  is in a very similar spot where she had a wound in 2016. Subsequently she tells me that the area does hurt quite significantly guarding quite a bit. Upon inspection today patient appears to show no signs of infection which is great news. Fortunately overall she is managing things quite nicely which is also good news. Nonetheless she has been using mupirocin at this time along with just a cover gauze dressing. She does show some signs of having had improvement but again this is not completely close by any means. She does have a history of chronic venous insufficiency with varicose veins as well and diabetes mellitus type 2. Patient's ABIs are actually fairly good at this point especially on the right at 0.94. 9/29; patient was readmitted to the clinic last week. She has a history of chronic venous hypertension with a wound in this area last in this clinic 5 years ago. She says the last wound she had was in this area 5 years ago. She has a compression stocking that she only wears on the left leg I could not really understand why she is not wearing them on both legs. She never did get venous reflux studies as far as I can tell but she says she has an appointment to get these done with [facility] I am not sure if that is accurate or not. The stockings she was wearing she said was about 64 years old. She is complaining about itching in her leg this week but no other pain complaints Electronic Signature(s) Signed: 05/22/2020 4:16:06 PM By: Baltazar Najjar MD Entered By: Baltazar Najjar on 05/22/2020 08:52:06 Doddridge, Darchelle TMarland Kitchen ([id number]) -------------------------------------------------------------------------------- Physical Exam Details Patient Name: [name], [name] Date of Service: 05/22/2020 8:15  AM Medical Record Number: [id number] Patient Account Number: [id number] Date of Birth/Sex: July 12, 1956 (64 y.o. F) Treating RN: Huel Coventry Primary Care Provider: Einar Crow Other Clinician: Referring Provider: Einar Crow Treating Provider/Extender: Altamese [place] in Treatment: 1 Constitutional Sitting or standing Blood Pressure is within target range for patient.. Pulse regular and within target range for patient.Marland Kitchen Respirations regular, non- labored and within target range.. Temperature is normal and within the target range for the patient.Marland Kitchen appears in no distress. Notes Wound exam; this is on the right medial ankle. I removed some eschar dry skin. There are still 2 small punched-out areas here. The rest of this appears healed. She has evidence of severe venous hypertension with dilated small veins all over the medial foot and ankle. Not much in the way of edema and no inflammation Electronic Signature(s) Signed: 05/22/2020 4:16:06 PM By: Baltazar Najjar MD Entered By: Baltazar Najjar on 05/22/2020 08:54:00 Spinney, [place] Silos ([id number]) -------------------------------------------------------------------------------- Physician Orders Details Patient Name: Tammy Broom T. Date of Service: 05/22/2020 8:15 AM Medical Record Number: [id number] Patient Account Number: [id number] Date of Birth/Sex: 1955-12-29 (64 y.o. F) Treating RN: Rodell Perna Primary Care Provider: Einar Crow Other Clinician: Referring Provider: Einar Crow Treating Provider/Extender: Altamese [place] in Treatment: 1 Verbal / Phone Orders: No Diagnosis Coding Wound Cleansing Wound #2 Right,Medial Ankle o Dial antibacterial soap, wash wounds, rinse and pat dry prior to dressing wounds Anesthetic (add to Medication List) Wound #2 Right,Medial Ankle o Topical Lidocaine 4% cream applied to wound bed prior to debridement (In Clinic Only). Primary Wound Dressing Wound #2  Right,Medial Ankle o Silver Alginate Secondary Dressing Wound #2 Right,Medial Ankle o ABD pad Dressing Change Frequency Wound #2 Right,Medial Ankle o Change dressing every week Edema Control o  3 Layer Compression System - Right Lower Extremity o Elevate legs to the level of the heart and pump ankles as often as possible Electronic Signature(s) Signed: 05/22/2020 3:20:27 PM By: Rodell PernaScott, Dajea Signed: 05/22/2020 4:16:06 PM By: Baltazar Najjarobson, Gerene Nedd MD Entered By: Rodell PernaScott, Dajea on 05/22/2020 08:46:24 Cost, [place]urn[place] T. ([id number]) -------------------------------------------------------------------------------- Problem List Details Patient Name: Drach, Leanda T. Date of Service: 05/22/2020 8:15 AM Medical Record Number: [id number] Patient Account Number: [id number] Date of Birth/Sex: Dec 24, 1955 (64 y.o. F) Treating RN: Huel CoventryWoody, Kim Primary Care Provider: Einar CrowAnderson, Marshall Other Clinician: Referring Provider: Einar CrowAnderson, Marshall Treating Provider/Extender: Altamese CarolinaOBSON, Lineth Thielke G [place] in Treatment: 1 Active Problems ICD-10 Encounter Code Description Active Date MDM Diagnosis I87.2 Venous insufficiency (chronic) (peripheral) 05/13/2020 No Yes E11.622 Type 2 diabetes mellitus with other skin ulcer 05/13/2020 No Yes L97.312 Non-pressure chronic ulcer of right ankle with fat layer exposed 05/13/2020 No Yes Inactive Problems Resolved Problems Electronic Signature(s) Signed: 05/22/2020 4:16:06 PM By: Baltazar Najjarobson, Kosisochukwu Goldberg MD Entered By: Baltazar Najjarobson, Valjean Ruppel on 05/22/2020 08:49:30 Kueker, Raylyn T. ([id number]) -------------------------------------------------------------------------------- Progress Note Details Patient Name: Tammy BroomGARCIA, Neeley T. Date of Service: 05/22/2020 8:15 AM Medical Record Number: [id number] Patient Account Number: [id number] Date of Birth/Sex: Dec 24, 1955 (64 y.o. F) Treating RN: Huel CoventryWoody, Kim Primary Care Provider: Einar CrowAnderson, Marshall Other Clinician: Referring Provider: Einar CrowAnderson,  Marshall Treating Provider/Extender: Altamese CarolinaOBSON, Danaiya Steadman G [place] in Treatment: 1 Subjective History of Present Illness (HPI) 64 year old patient who comes from Dr. Alva GarnetMarshall Anderson's office with the chief complaints of having a foot ulcer with redness for about 2 weeks. The patient has a past medical history of diabetes mellitus type 2 which is uncontrolled with neuropathy, hypercholesterolemia, osteoarthritis, major depression and has had a past history of appendectomy, cholecystectomy and a DandC. She has never been a smoker and her last hemoglobin A1c done in February 2016 was 8.2. As per our office note she has recently seen podiatry and also has some vascular workup done recently and saw Dr. Gilda CreaseSchnier on 04/18/2015. Clinically they found varicose veins of the lower extremity with both ulcers of the ankle and inflammation. There was an active ulcer on the right lower extremity. Lower extremity venous duplex exam was recommended and the patient was asked to wear compression stockings. Venous duplex studies are still pending. 05/27/2015 -- she has still not done her venous duplex studies and understand she will be traveling out of town this week and does not want to wear a compression wrap and will get herself some compression stockings. 06/10/2015 -- she is back from her travels and was wearing a compression stocking as per her history. She has not yet got her vascular workup and intervention done. 06/17/2015 -- we have always seen this patient with a Spanish-speaking interpreter but she still does not understand that the venous duplex studies have to be done or she chooses not to follow our advice. She did not keep the wound was prepped for more than 3 days and says she has been wearing compression stockings but today she comes with a Tubigrip. As far as I'm concerned this patient is not being compliant. Readmission: 05/13/2020 patient presents today for reevaluation here in the clinic concerning  issues that she has been having with a wound on the right medial ankle region which is in a very similar spot where she had a wound in 2016. Subsequently she tells me that the area does hurt quite significantly guarding quite a bit. Upon inspection today patient appears to show no signs of infection which is great news. Fortunately overall she is  managing things quite nicely which is also good news. Nonetheless she has been using mupirocin at this time along with just a cover gauze dressing. She does show some signs of having had improvement but again this is not completely close by any means. She does have a history of chronic venous insufficiency with varicose veins as well and diabetes mellitus type 2. Patient's ABIs are actually fairly good at this point especially on the right at 0.94. 9/29; patient was readmitted to the clinic last week. She has a history of chronic venous hypertension with a wound in this area last in this clinic 5 years ago. She says the last wound she had was in this area 5 years ago. She has a compression stocking that she only wears on the left leg I could not really understand why she is not wearing them on both legs. She never did get venous reflux studies as far as I can tell but she says she has an appointment to get these done with [facility] I am not sure if that is accurate or not. The stockings she was wearing she said was about 64 years old. She is complaining about itching in her leg this week but no other pain complaints Objective Constitutional Sitting or standing Blood Pressure is within target range for patient.. Pulse regular and within target range for patient.Marland Kitchen Respirations regular, non- labored and within target range.. Temperature is normal and within the target range for the patient.Marland Kitchen appears in no distress. Vitals Time Taken: 8:15 AM, Height: 62 in, Weight: 160 lbs, BMI: 29.3, Temperature: 97.9 F, Pulse: 73 bpm, Respiratory Rate: 16  breaths/min, Blood Pressure: 128/77 mmHg. General Notes: Wound exam; this is on the right medial ankle. I removed some eschar dry skin. There are still 2 small punched-out areas here. The rest of this appears healed. She has evidence of severe venous hypertension with dilated small veins all over the medial foot and ankle. Not much in the way of edema and no inflammation Integumentary (Hair, Skin) Wound #2 status is Open. Original cause of wound was Trauma. The wound is located on the Right,Medial Ankle. The wound measures 0.9cm length x 1cm width x 0.1cm depth; 0.707cm^2 area and 0.071cm^3 volume. There is Fat Layer (Subcutaneous Tissue) exposed. There is no tunneling or undermining noted. There is a none present amount of drainage noted. There is medium (34-66%) pink granulation within the wound bed. There is a medium (34-66%) amount of necrotic tissue within the wound bed including Eschar and Adherent Slough. VARNELL, DONATE ([id number]) Assessment Active Problems ICD-10 Venous insufficiency (chronic) (peripheral) Type 2 diabetes mellitus with other skin ulcer Non-pressure chronic ulcer of right ankle with fat layer exposed Procedures Wound #2 Pre-procedure diagnosis of Wound #2 is a Trauma, Other located on the Right,Medial Ankle . There was a Selective/Open Wound Skin/Epidermis Debridement with a total area of 0.9 sq cm performed by Maxwell Caul, MD. With the following instrument(s): Curette to remove Viable and Non-Viable tissue/material. Material removed includes Eschar, Callus, Slough, Skin: Dermis, and Skin: Epidermis after achieving pain control using Lidocaine. A time out was conducted at 08:25, prior to the start of the procedure. A Minimum amount of bleeding was controlled with Pressure. The procedure was tolerated well. Post Debridement Measurements: 0.9cm length x 1cm width x 0.1cm depth; 0.071cm^3 volume. Character of Wound/Ulcer Post Debridement is stable. Post  procedure Diagnosis Wound #2: Same as Pre-Procedure Pre-procedure diagnosis of Wound #2 is a Trauma, Other located on  the Right,Medial Ankle . There was a Three Layer Compression Therapy Procedure by Rodell Perna, RN. Post procedure Diagnosis Wound #2: Same as Pre-Procedure Plan Wound Cleansing: Wound #2 Right,Medial Ankle: Dial antibacterial soap, wash wounds, rinse and pat dry prior to dressing wounds Anesthetic (add to Medication List): Wound #2 Right,Medial Ankle: Topical Lidocaine 4% cream applied to wound bed prior to debridement (In Clinic Only). Primary Wound Dressing: Wound #2 Right,Medial Ankle: Silver Alginate Secondary Dressing: Wound #2 Right,Medial Ankle: ABD pad Dressing Change Frequency: Wound #2 Right,Medial Ankle: Change dressing every week Edema Control: 3 Layer Compression System - Right Lower Extremity Elevate legs to the level of the heart and pump ankles as often as possible 1. I continued with the silver alginate under 3 layer compression. Should be closed by next week 2. TCA on the skin to help with any itching 3. She says she has an appointment for venous reflux studies 4. She needs bilateral 20/30 below-knee stockings again the discussion here just did not leave me confident that she was being compliant with stockings at all. 5. Her dorsalis pedis pulse was palpable posterior tibial somewhat reduced popliteal also palpable. Electronic Signature(s) Signed: 05/22/2020 4:16:06 PM By: Baltazar Najjar MD Entered By: Baltazar Najjar on 05/22/2020 08:55:21 Vanderkolk, Atisha TMarland Kitchen ([id number]) -------------------------------------------------------------------------------- SuperBill Details Patient Name: Tammy Broom T. Date of Service: 05/22/2020 Medical Record Number: [id number] Patient Account Number: [id number] Date of Birth/Sex: 07-05-1956 (64 y.o. F) Treating RN: Huel Coventry Primary Care Provider: Einar Crow Other Clinician: Referring Provider: Einar Crow Treating Provider/Extender: Altamese [place] in Treatment: 1 Diagnosis Coding ICD-10 Codes Code Description I87.2 Venous insufficiency (chronic) (peripheral) E11.622 Type 2 diabetes mellitus with other skin ulcer L97.312 Non-pressure chronic ulcer of right ankle with fat layer exposed Facility Procedures CPT4 Code: [id number] Description: [id number] - DEBRIDE WOUND 1ST 20 SQ CM OR < Modifier: Quantity: 1 CPT4 Code: Description: ICD-10 Diagnosis Description I87.2 Venous insufficiency (chronic) (peripheral) L97.312 Non-pressure chronic ulcer of right ankle with fat layer exposed Modifier: Quantity: Physician Procedures CPT4 Code: [id number] Description: [id number] - WC PHYS DEBR WO ANESTH 20 SQ CM Modifier: Quantity: 1 CPT4 Code: Description: ICD-10 Diagnosis Description I87.2 Venous insufficiency (chronic) (peripheral) L97.312 Non-pressure chronic ulcer of right ankle with fat layer exposed Modifier: Quantity: Electronic Signature(s) Signed: 05/22/2020 4:16:06 PM By: Baltazar Najjar MD Entered By: Baltazar Najjar on 05/22/2020 08:55:44

## 2020-05-22 NOTE — Progress Notes (Signed)
CHERISE, FEDDER ([id number]) Visit Report for 05/22/2020 Arrival Information Details Patient Name: Tammy Brewer, [name] Date of Service: 05/22/2020 8:15 AM Medical Record Number: [id number] Patient Account Number: [id number] Date of Birth/Sex: 25-Mar-1956 (64 y.o. F) Treating RN: Huel Coventry Primary Care Kinslee Dalpe: Einar Crow Other Clinician: Referring Macyn Shropshire: Einar Crow Treating Delonda Coley/Extender: [facility] [place] in Treatment: 1 Visit Information History Since Last Visit Added or deleted any medications: No Patient Arrived: Ambulatory Any new allergies or adverse reactions: No Arrival Time: 08:15 Had a fall or experienced change in No Accompanied By: self activities of daily living that may affect Transfer Assistance: None risk of falls: Patient Identification Verified: Yes Signs or symptoms of abuse/neglect since last visito No Secondary Verification Process Completed: Yes Hospitalized since last visit: No Implantable device outside of the clinic excluding No cellular tissue based products placed in the center since last visit: Pain Present Now: Yes Electronic Signature(s) Signed: 05/22/2020 11:50:43 AM By: Dayton Martes RCP, RRT, CHT Entered By: Dayton Martes on 05/22/2020 08:16:42 Soltis, Sabria T. ([id number]) -------------------------------------------------------------------------------- Compression Therapy Details Patient Name: Tammy Broom T. Date of Service: 05/22/2020 8:15 AM Medical Record Number: [id number] Patient Account Number: [id number] Date of Birth/Sex: Mar 23, 1956 (64 y.o. F) Treating RN: Rodell Perna Primary Care Bueford Arp: Einar Crow Other Clinician: Referring Ridgely Anastacio: Einar Crow Treating Marybelle Giraldo/Extender: [employer] [place] in Treatment: 1 Compression Therapy Performed for Wound Assessment: Wound #2 Right,Medial Ankle Performed By: Clinician Rodell Perna, RN Compression Type: Three  Layer Post Procedure Diagnosis Same as Pre-procedure Electronic Signature(s) Signed: 05/22/2020 3:20:27 PM By: Rodell Perna Entered By: Rodell Perna on 05/22/2020 08:46:04 Craigie, Jone T. ([id number]) -------------------------------------------------------------------------------- Encounter Discharge Information Details Patient Name: Tammy Broom T. Date of Service: 05/22/2020 8:15 AM Medical Record Number: [id number] Patient Account Number: [id number] Date of Birth/Sex: 02-13-1956 (64 y.o. F) Treating RN: Rodell Perna Primary Care Daymon Hora: Einar Crow Other Clinician: Referring Kayzlee Wirtanen: Einar Crow Treating Garrus Gauthreaux/Extender: [facility] [place] in Treatment: 1 Encounter Discharge Information Items Post Procedure Vitals Discharge Condition: Stable Temperature (F): 97.9 Ambulatory Status: Ambulatory Pulse (bpm): 73 Discharge Destination: Home Respiratory Rate (breaths/min): 16 Transportation: Private Auto Blood Pressure (mmHg): 128/77 Accompanied By: self Schedule Follow-up Appointment: No Clinical Summary of Care: Electronic Signature(s) Signed: 05/22/2020 3:20:27 PM By: Rodell Perna Entered By: Rodell Perna on 05/22/2020 08:47:51 Jenning, Raysha T. ([id number]) -------------------------------------------------------------------------------- Lower Extremity Assessment Details Patient Name: [name], Tammy T. Date of Service: 05/22/2020 8:15 AM Medical Record Number: [id number] Patient Account Number: [id number] Date of Birth/Sex: [birth year] (64 y.o. F) Treating RN: Rodell Perna Primary Care Sincere Liuzzi: Einar Crow Other Clinician: Referring Richmond Coldren: Einar Crow Treating Kden Wagster/Extender: [employer] [place] in Treatment: 1 Edema Assessment Assessed: [Left: No] [Right: No] Edema: [Left: N] [Right: o] Calf Left: Right: Point of Measurement: 24 cm From Medial Instep 34 cm Ankle Left: Right: Point of Measurement: 9 cm From Medial Instep 19  cm Vascular Assessment Pulses: Dorsalis Pedis Palpable: [Right:Yes] Electronic Signature(s) Signed: 05/22/2020 3:20:27 PM By: Rodell Perna Entered By: Rodell Perna on 05/22/2020 08:29:05 Madarang, Inda T. ([id number]) -------------------------------------------------------------------------------- Multi Wound Chart Details Patient Name: Tammy Broom T. Date of Service: 05/22/2020 8:15 AM Medical Record Number: [id number] Patient Account Number: [id number] Date of Birth/Sex: Mar 03, 1956 (64 y.o. F) Treating RN: Rodell Perna Primary Care Harneet Noblett: Einar Crow Other Clinician: Referring Kasaundra Fahrney: Einar Crow Treating Ariea Rochin/Extender: [facility] [place] in Treatment: 1 Vital Signs Height(in): 62 Pulse(bpm): 73 Weight(lbs): 160 Blood Pressure(mmHg): 128/77 Body Mass Index(BMI): 29 Temperature(F): 97.9 Respiratory Rate(breaths/min): 16 Photos: [N/A:N/A] Wound  Location: Right, Medial Ankle N/A N/A Wounding Event: Trauma N/A N/A Primary Etiology: Trauma, Other N/A N/A Comorbid History: Type II Diabetes, Osteoarthritis, N/A N/A Neuropathy Date Acquired: 01/13/2020 N/A N/A Weeks of Treatment: 1 N/A N/A Wound Status: Open N/A N/A Measurements L x W x D (cm) 0.9x1x0.1 N/A N/A Area (cm) : 0.707 N/A N/A Volume (cm) : 0.071 N/A N/A % Reduction in Area: 60.00% N/A N/A % Reduction in Volume: 59.90% N/A N/A Classification: Full Thickness Without Exposed N/A N/A Support Structures Exudate Amount: None Present N/A N/A Granulation Amount: Medium (34-66%) N/A N/A Granulation Quality: Pink N/A N/A Necrotic Amount: Medium (34-66%) N/A N/A Necrotic Tissue: Eschar, Adherent Slough N/A N/A Exposed Structures: Fat Layer (Subcutaneous Tissue): N/A N/A Yes Fascia: No Tendon: No Muscle: No Joint: No Bone: No Epithelialization: Medium (34-66%) N/A N/A Debridement: Debridement - Excisional N/A N/A Pre-procedure Verification/Time 08:25 N/A N/A Out Taken: Pain Control:  Lidocaine N/A N/A Tissue Debrided: Necrotic/Eschar, Subcutaneous, N/A N/A Slough Level: Skin/Subcutaneous Tissue N/A N/A Debridement Area (sq cm): 0.9 N/A N/A Instrument: Curette N/A N/A Bleeding: Minimum N/A N/A Hemostasis Achieved: Pressure N/A N/A Debridement Treatment Procedure was tolerated well N/A N/A Response: Post Debridement 0.9x1x0.1 N/A N/A Measurements L x W x D (cm) Ballinas, Tniyah T. ([id number]) Post Debridement Volume: 0.071 N/A N/A (cm) Procedures Performed: Compression Therapy N/A N/A Debridement Treatment Notes Wound #2 (Right, Medial Ankle) Notes scell, abd, 3LR Electronic Signature(s) Signed: 05/22/2020 4:16:06 PM By: Baltazar Najjar MD Entered By: Baltazar Najjar on 05/22/2020 08:49:50 Anton, Grandville Silos ([id number]) -------------------------------------------------------------------------------- Multi-Disciplinary Care Plan Details Patient Name: Tammy Broom T. Date of Service: 05/22/2020 8:15 AM Medical Record Number: [id number] Patient Account Number: [id number] Date of Birth/Sex: 08-Mar-1956 (64 y.o. F) Treating RN: Rodell Perna Primary Care Vontrell Pullman: Einar Crow Other Clinician: Referring Kenai Fluegel: Einar Crow Treating Kaydyn Sayas/Extender: [employer] [place] in Treatment: 1 Active Inactive Wound/Skin Impairment Nursing Diagnoses: Impaired tissue integrity Knowledge deficit related to ulceration/compromised skin integrity Goals: Patient/caregiver will verbalize understanding of skin care regimen Date Initiated: 05/13/2020 Target Resolution Date: 05/27/2020 Goal Status: Active Interventions: Assess patient/caregiver ability to obtain necessary supplies Assess patient/caregiver ability to perform ulcer/skin care regimen upon admission and as needed Assess ulceration(s) every visit Provide education on ulcer and skin care Treatment Activities: Skin care regimen initiated : 05/13/2020 Notes: Electronic Signature(s) Signed: 05/22/2020  3:20:27 PM By: Rodell Perna Entered By: Rodell Perna on 05/22/2020 08:43:43 Perren, Orine T. ([id number]) -------------------------------------------------------------------------------- Pain Assessment Details Patient Name: Tammy Broom T. Date of Service: 05/22/2020 8:15 AM Medical Record Number: [id number] Patient Account Number: [id number] Date of Birth/Sex: Nov 27, 1955 (64 y.o. F) Treating RN: Rodell Perna Primary Care Niccolo Burggraf: Einar Crow Other Clinician: Referring Revonda Menter: Einar Crow Treating Giavanni Zeitlin/Extender: [facility] [place] in Treatment: 1 Active Problems Location of Pain Severity and Description of Pain Patient Has Paino No Site Locations Pain Management and Medication Current Pain Management: Electronic Signature(s) Signed: 05/22/2020 3:20:27 PM By: Rodell Perna Entered By: Rodell Perna on 05/22/2020 08:26:49 Cerrone, Keelynn T. ([id number]) -------------------------------------------------------------------------------- Patient/Caregiver Education Details Patient Name: Tammy Broom T. Date of Service: 05/22/2020 8:15 AM Medical Record Number: [id number] Patient Account Number: [id number] Date of Birth/Gender: [birth year] (64 y.o. F) Treating RN: Rodell Perna Primary Care Physician: Einar Crow Other Clinician: Referring Physician: Einar Crow Treating Physician/Extender: [employer] [place] in Treatment: 1 Education Assessment Education Provided To: Patient Education Topics Provided Wound/Skin Impairment: Handouts: Caring for Your Ulcer Methods: Demonstration, Explain/Verbal Responses: State content correctly Electronic Signature(s) Signed: 05/22/2020 3:20:27 PM By: Rodell Perna Entered By:  Rodell Perna on 05/22/2020 08:47:01 Adolf, Renesmay T. ([id number]) -------------------------------------------------------------------------------- Wound Assessment Details Patient Name: NESTA, SCATURRO T. Date of Service: 05/22/2020 8:15  AM Medical Record Number: [id number] Patient Account Number: [id number] Date of Birth/Sex: 1955/11/08 (64 y.o. F) Treating RN: Rodell Perna Primary Care Jerrelle Michelsen: Einar Crow Other Clinician: Referring Adison Jerger: Einar Crow Treating Fardowsa Authier/Extender: [facility] [place] in Treatment: 1 Wound Status Wound Number: 2 Primary Etiology: Trauma, Other Wound Location: Right, Medial Ankle Wound Status: Open Wounding Event: Trauma Comorbid History: Type II Diabetes, Osteoarthritis, Neuropathy Date Acquired: 01/13/2020 Weeks Of Treatment: 1 Clustered Wound: No Photos Wound Measurements Length: (cm) 0.9 Width: (cm) 1 Depth: (cm) 0.1 Area: (cm) 0.707 Volume: (cm) 0.071 % Reduction in Area: 60% % Reduction in Volume: 59.9% Epithelialization: Medium (34-66%) Tunneling: No Undermining: No Wound Description Classification: Full Thickness Without Exposed Support Structures Exudate Amount: None Present Foul Odor After Cleansing: No Slough/Fibrino No Wound Bed Granulation Amount: Medium (34-66%) Exposed Structure Granulation Quality: Pink Fascia Exposed: No Necrotic Amount: Medium (34-66%) Fat Layer (Subcutaneous Tissue) Exposed: Yes Necrotic Quality: Eschar, Adherent Slough Tendon Exposed: No Muscle Exposed: No Joint Exposed: No Bone Exposed: No Treatment Notes Wound #2 (Right, Medial Ankle) Notes scell, abd, 3LR Electronic Signature(s) Signed: 05/22/2020 3:20:27 PM By: Rodell Perna Entered By: Rodell Perna on 05/22/2020 08:27:46 Bienaime, Kindred T. ([id number]) Lundahl, Mairead T. ([id number]) -------------------------------------------------------------------------------- Vitals Details Patient Name: Felipa Furnace, Avarie T. Date of Service: 05/22/2020 8:15 AM Medical Record Number: [id number] Patient Account Number: [id number] Date of Birth/Sex: April 15, 1956 (64 y.o. F) Treating RN: Huel Coventry Primary Care Mac Dowdell: Einar Crow Other Clinician: Referring Thai Hemrick:  Einar Crow Treating Jermone Geister/Extender: [employer] [place] in Treatment: 1 Vital Signs Time Taken: 08:15 Temperature (F): 97.9 Height (in): 62 Pulse (bpm): 73 Weight (lbs): 160 Respiratory Rate (breaths/min): 16 Body Mass Index (BMI): 29.3 Blood Pressure (mmHg): 128/77 Reference Range: 80 - 120 mg / dl Electronic Signature(s) Signed: 05/22/2020 11:50:43 AM By: Dayton Martes RCP, RRT, CHT Entered By: Dayton Martes on 05/22/2020 08:17:44

## 2020-05-29 ENCOUNTER — Encounter: Payer: No Typology Code available for payment source | Attending: Internal Medicine | Admitting: Internal Medicine

## 2020-05-29 ENCOUNTER — Other Ambulatory Visit: Payer: Self-pay

## 2020-05-29 DIAGNOSIS — E11622 Type 2 diabetes mellitus with other skin ulcer: Secondary | ICD-10-CM | POA: Diagnosis present

## 2020-05-29 DIAGNOSIS — Z9049 Acquired absence of other specified parts of digestive tract: Secondary | ICD-10-CM | POA: Diagnosis not present

## 2020-05-29 DIAGNOSIS — I872 Venous insufficiency (chronic) (peripheral): Secondary | ICD-10-CM | POA: Diagnosis not present

## 2020-05-29 DIAGNOSIS — M1990 Unspecified osteoarthritis, unspecified site: Secondary | ICD-10-CM | POA: Insufficient documentation

## 2020-05-29 DIAGNOSIS — L97312 Non-pressure chronic ulcer of right ankle with fat layer exposed: Secondary | ICD-10-CM | POA: Diagnosis not present

## 2020-05-29 DIAGNOSIS — E11621 Type 2 diabetes mellitus with foot ulcer: Secondary | ICD-10-CM | POA: Diagnosis not present

## 2020-05-31 NOTE — Progress Notes (Signed)
HENLEY, BLYTH ([id number]) Visit Report for 05/29/2020 HPI Details Patient Name: Tammy Brewer, [name] Date of Service: 05/29/2020 8:15 AM Medical Record Number: [id number] Patient Account Number: [id number] Date of Birth/Sex: 06/27/1956 (64 y.o. F) Treating RN: Rodell Perna Primary Care Provider: Einar Crow Other Clinician: Referring Provider: Einar Crow Treating Provider/Extender: Altamese [place] in Treatment: 2 History of Present Illness HPI Description: 64 year old patient who comes from Dr. Alva Garnet office with the chief complaints of having a foot ulcer with redness for about 2 weeks. The patient has a past medical history of diabetes mellitus type 2 which is uncontrolled with neuropathy, hypercholesterolemia, osteoarthritis, major depression and has had a past history of appendectomy, cholecystectomy and a DandC. She has never been a smoker and her last hemoglobin A1c done in February 2016 was 8.2. As per our office note she has recently seen podiatry and also has some vascular workup done recently and saw Dr. Gilda Crease on 04/18/2015. Clinically they found varicose veins of the lower extremity with both ulcers of the ankle and inflammation. There was an active ulcer on the right lower extremity. Lower extremity venous duplex exam was recommended and the patient was asked to wear compression stockings. Venous duplex studies are still pending. 05/27/2015 -- she has still not done her venous duplex studies and understand she will be traveling out of town this week and does not want to wear a compression wrap and will get herself some compression stockings. 06/10/2015 -- she is back from her travels and was wearing a compression stocking as per her history. She has not yet got her vascular workup and intervention done. 06/17/2015 -- we have always seen this patient with a Spanish-speaking interpreter but she still does not understand that the venous  duplex studies have to be done or she chooses not to follow our advice. She did not keep the wound was prepped for more than 3 days and says she has been wearing compression stockings but today she comes with a Tubigrip. As far as I'm concerned this patient is not being compliant. Readmission: 05/13/2020 patient presents today for reevaluation here in the clinic concerning issues that she has been having with a wound on the right medial ankle region which is in a very similar spot where she had a wound in 2016. Subsequently she tells me that the area does hurt quite significantly guarding quite a bit. Upon inspection today patient appears to show no signs of infection which is great news. Fortunately overall she is managing things quite nicely which is also good news. Nonetheless she has been using mupirocin at this time along with just a cover gauze dressing. She does show some signs of having had improvement but again this is not completely close by any means. She does have a history of chronic venous insufficiency with varicose veins as well and diabetes mellitus type 2. Patient's ABIs are actually fairly good at this point especially on the right at 0.94. 9/29; patient was readmitted to the clinic last week. She has a history of chronic venous hypertension with a wound in this area last in this clinic 5 years ago. She says the last wound she had was in this area 5 years ago. She has a compression stocking that she only wears on the left leg I could not really understand why she is not wearing them on both legs. She never did get venous reflux studies as far as I can tell but she says she has an appointment to  get these done with [facility] I am not sure if that is accurate or not. The stockings she was wearing she said was about 64 years old. She is complaining about itching in her leg this week but no other pain complaints 10/6; patient with severe chronic venous hypertension. She  had a wound in the right medial ankle just below the medial malleolus. She comes in with scabs and dry skin and eschar. I remove this there is no open wound in this area everything is epithelialized. She has a 20/30 below-knee stocking which according to her is about 64 years old. I told her that these things have a shelf life of about 4 to 6 months and then they need to be reordered and replaced. She expressed understanding through the interpreter. She also has venous reflux studies ordered. They are apparently going to be done on 10/18 Electronic Signature(s) Signed: 05/30/2020 4:57:50 PM By: Elliot Gurney, [name], RN, CWS, [name], [name] Signed: 05/31/2020 8:13:46 AM By: Baltazar Najjar MD Previous Signature: 05/29/2020 4:14:27 PM Version By: Baltazar Najjar MD Entered By: Elliot Gurney, [name], RN, CWS, Kim on 05/30/2020 16:57:50 Blodgett, Tammy Silos ([id number]) -------------------------------------------------------------------------------- Physical Exam Details Patient Name: Tammy Brewer, Tammy T. Date of Service: 05/29/2020 8:15 AM Medical Record Number: [id number] Patient Account Number: [id number] Date of Birth/Sex: 1955/09/29 (64 y.o. F) Treating RN: Rodell Perna Primary Care Provider: Einar Crow Other Clinician: Referring Provider: Einar Crow Treating Provider/Extender: Altamese [place] in Treatment: 2 Constitutional Sitting or standing Blood Pressure is within target range for patient.. Pulse regular and within target range for patient.Marland Kitchen Respirations regular, non- labored and within target range.. Temperature is normal and within the target range for the patient.Marland Kitchen appears in no distress. Cardiovascular Pedal pulses are palpable. She does not have anything in terms of edema. She does have dilated veins extensively in the ankle and medial foot. Varicosities in her calf none of this is tender.. Notes Wound exam; areas on the right medial ankle. Severely dilated small veins in this area as well as  the medial part of her foot. There are varicosities in her calf. Electronic Signature(s) Signed: 05/30/2020 4:58:03 PM By: Elliot Gurney, [name], RN, CWS, [name], [name] Signed: 05/31/2020 8:13:46 AM By: Baltazar Najjar MD Previous Signature: 05/29/2020 4:14:27 PM Version By: Baltazar Najjar MD Entered By: Elliot Gurney, [name], RN, CWS, Kim on 05/30/2020 16:58:03 Nichol, Tammy Silos ([id number]) -------------------------------------------------------------------------------- Physician Orders Details Patient Name: BRELYN, WOEHL T. Date of Service: 05/29/2020 8:15 AM Medical Record Number: [id number] Patient Account Number: [id number] Date of Birth/Sex: 04-04-1956 (64 y.o. F) Treating RN: Rodell Perna Primary Care Provider: Einar Crow Other Clinician: Referring Provider: Einar Crow Treating Provider/Extender: Altamese [place] in Treatment: 2 Verbal / Phone Orders: No Diagnosis Coding Discharge From [facility] o Discharge from Wound Care Center - Treatment complete Electronic Signature(s) Signed: 05/30/2020 4:56:54 PM By: Elliot Gurney [name], RN, CWS, [name], [name] Signed: 05/31/2020 8:13:46 AM By: Baltazar Najjar MD Previous Signature: 05/29/2020 2:10:04 PM Version By: Rodell Perna Entered By: Elliot Gurney [name], RN, CWS, Kim on 05/30/2020 16:56:54 Bhavsar, Tammy Silos ([id number]) -------------------------------------------------------------------------------- Problem List Details Patient Name: Tammy Brewer, Tammy T. Date of Service: 05/29/2020 8:15 AM Medical Record Number: [id number] Patient Account Number: [id number] Date of Birth/Sex: 1955-09-19 (64 y.o. F) Treating RN: Rodell Perna Primary Care Provider: Einar Crow Other Clinician: Referring Provider: Einar Crow Treating Provider/Extender: Altamese [place] in Treatment: 2 Active Problems ICD-10 Encounter Code Description Active Date MDM Diagnosis I87.2 Venous insufficiency (chronic) (peripheral) 05/13/2020 No Yes  W29.56211.622 Type 2 diabetes  mellitus with other skin ulcer 05/13/2020 No Yes L97.312 Non-pressure chronic ulcer of right ankle with fat layer exposed 05/13/2020 No Yes Inactive Problems Resolved Problems Electronic Signature(s) Signed: 05/30/2020 4:57:29 PM By: Elliot GurneyWoody, [name], RN, CWS, [name], [name] Signed: 05/31/2020 8:13:46 AM By: Baltazar Najjarobson, Denver Harder MD Previous Signature: 05/29/2020 4:14:27 PM Version By: Baltazar Najjarobson, Stepen Prins MD Entered By: Elliot GurneyWoody, [name], RN, CWS, Kim on 05/30/2020 16:57:28 Tammy Brewer, Tammy SilosMARIA T. ([id number]) -------------------------------------------------------------------------------- Progress Note Details Patient Name: Tammy Brewer, Tammy T. Date of Service: 05/29/2020 8:15 AM Medical Record Number: [id number] Patient Account Number: [id number] Date of Birth/Sex: Oct 29, 1955 (64 y.o. F) Treating RN: Rodell PernaScott, Dajea Primary Care Provider: Einar CrowAnderson, Marshall Other Clinician: Referring Provider: Einar CrowAnderson, Marshall Treating Provider/Extender: Altamese CarolinaOBSON, Sephiroth Mcluckie G Weeks in Treatment: 2 Subjective History of Present Illness (HPI) 64 year old patient who comes from Dr. Alva GarnetMarshall Anderson's office with the chief complaints of having a foot ulcer with redness for about 2 weeks. The patient has a past medical history of diabetes mellitus type 2 which is uncontrolled with neuropathy, hypercholesterolemia, osteoarthritis, major depression and has had a past history of appendectomy, cholecystectomy and a DandC. She has never been a smoker and her last hemoglobin A1c done in February 2016 was 8.2. As per our office note she has recently seen podiatry and also has some vascular workup done recently and saw Dr. Gilda CreaseSchnier on 04/18/2015. Clinically they found varicose veins of the lower extremity with both ulcers of the ankle and inflammation. There was an active ulcer on the right lower extremity. Lower extremity venous duplex exam was recommended and the patient was asked to wear compression stockings. Venous duplex studies are still  pending. 05/27/2015 -- she has still not done her venous duplex studies and understand she will be traveling out of town this week and does not want to wear a compression wrap and will get herself some compression stockings. 06/10/2015 -- she is back from her travels and was wearing a compression stocking as per her history. She has not yet got her vascular workup and intervention done. 06/17/2015 -- we have always seen this patient with a Spanish-speaking interpreter but she still does not understand that the venous duplex studies have to be done or she chooses not to follow our advice. She did not keep the wound was prepped for more than 3 days and says she has been wearing compression stockings but today she comes with a Tubigrip. As far as I'm concerned this patient is not being compliant. Readmission: 05/13/2020 patient presents today for reevaluation here in the clinic concerning issues that she has been having with a wound on the right medial ankle region which is in a very similar spot where she had a wound in 2016. Subsequently she tells me that the area does hurt quite significantly guarding quite a bit. Upon inspection today patient appears to show no signs of infection which is great news. Fortunately overall she is managing things quite nicely which is also good news. Nonetheless she has been using mupirocin at this time along with just a cover gauze dressing. She does show some signs of having had improvement but again this is not completely close by any means. She does have a history of chronic venous insufficiency with varicose veins as well and diabetes mellitus type 2. Patient's ABIs are actually fairly good at this point especially on the right at 0.94. 9/29; patient was readmitted to the clinic last week. She has a history of chronic venous hypertension with a wound in  this area last in this clinic 5 years ago. She says the last wound she had was in this area 5 years ago. She has  a compression stocking that she only wears on the left leg I could not really understand why she is not wearing them on both legs. She never did get venous reflux studies as far as I can tell but she says she has an appointment to get these done with [facility] I am not sure if that is accurate or not. The stockings she was wearing she said was about 64 years old. She is complaining about itching in her leg this week but no other pain complaints 10/6; patient with severe chronic venous hypertension. She had a wound in the right medial ankle just below the medial malleolus. She comes in with scabs and dry skin and eschar. I remove this there is no open wound in this area everything is epithelialized. She has a 20/30 below-knee stocking which according to her is about 64 years old. I told her that these things have a shelf life of about 4 to 6 months and then they need to be reordered and replaced. She expressed understanding through the interpreter. She also has venous reflux studies ordered. They are apparently going to be done on 10/18 Objective Constitutional Sitting or standing Blood Pressure is within target range for patient.. Pulse regular and within target range for patient.Marland Kitchen Respirations regular, non- labored and within target range.. Temperature is normal and within the target range for the patient.Marland Kitchen appears in no distress. Vitals Time Taken: 8:10 AM, Height: 62 in, Weight: 160 lbs, BMI: 29.3, Temperature: 98.2 F, Pulse: 67 bpm, Respiratory Rate: 18 breaths/min, Blood Pressure: 124/78 mmHg. Cardiovascular Pedal pulses are palpable. She does not have anything in terms of edema. She does have dilated veins extensively in the ankle and medial foot. Varicosities in her calf none of this is tender.. General Notes: Wound exam; areas on the right medial ankle. Severely dilated small veins in this area as well as the medial part of her foot. There are varicosities in her  calf. Integumentary (Hair, Skin) Tammy Brewer, Tammy T. ([id number]) Wound #2 status is Healed - Epithelialized. Original cause of wound was Trauma. The wound is located on the Right,Medial Ankle. The wound measures 0cm length x 0cm width x 0cm depth; 0cm^2 area and 0cm^3 volume. There is Fat Layer (Subcutaneous Tissue) exposed. There is a none present amount of drainage noted. There is medium (34-66%) pink granulation within the wound bed. There is a medium (34-66%) amount of necrotic tissue within the wound bed including Eschar and Adherent Slough. Assessment Active Problems ICD-10 Venous insufficiency (chronic) (peripheral) Type 2 diabetes mellitus with other skin ulcer Non-pressure chronic ulcer of right ankle with fat layer exposed Plan [facility]charge From [facility]: Discharge from Wound Care Center - Treatment complete 1. The patient's area is healed. I have advised to keep this area protected with a border foam type dressing. 2. I have put her in her own stockings however she was told that she will need to get new stockings and that these need to be replaced every 4 to 6 months. For now I think 20/30 mm below-knee stockings are sufficient although more aggressive compression may be necessary if she fails to maintain skin integrity in this area 3. She has venous reflux studies on 10/18. We order them they will either come to me or [facility]. The big question here does she have superficial greater saphenous  vein reflux severe enough to warrant ablation. If so I have gone over this with her today and told her that if something can be done I think it is in her best interest to have this done. 4. She can be discharged from this clinic she is free to call us if she has questions. Her venous reflux study should come to one of our boxes in [facility]) Signed: 05/30/2020 4:58:36 PM By: Elliot Gurney, [name], RN, CWS, [name], [name] Signed: 05/31/2020 8:13:46 AM By: Baltazar Najjar MD Previous  Signature: 05/29/2020 4:14:27 PM Version By: Baltazar Najjar MD Entered By: Elliot Gurney, [name], RN, CWS, Kim on 05/30/2020 16:58:35 Sambrano, Tammy Silos ([id number]) -------------------------------------------------------------------------------- SuperBill Details Patient Name: Tammy Brewer, Tammy T. Date of Service: 05/29/2020 Medical Record Number: [id number] Patient Account Number: [id number] Date of Birth/Sex: [birth year] (64 y.o. F) Treating RN: Rodell Perna Primary Care Provider: Einar Crow Other Clinician: Referring Provider: Einar Crow Treating Provider/Extender: Altamese [place] in Treatment: 2 Diagnosis Coding ICD-10 Codes Code Description I87.2 Venous insufficiency (chronic) (peripheral) E11.622 Type 2 diabetes mellitus with other skin ulcer L97.312 Non-pressure chronic ulcer of right ankle with fat layer exposed Facility Procedures CPT4 Code: [id number] Description: 99213 - WOUND CARE VISIT-LEV 3 EST PT Modifier: Quantity: 1 Physician Procedures CPT4 Code: [id number] Description: 99213 - WC PHYS LEVEL 3 - EST PT Modifier: Quantity: 1 CPT4 Code: Description: ICD-10 Diagnosis Description I87.2 Venous insufficiency (chronic) (peripheral) E11.622 Type 2 diabetes mellitus with other skin ulcer L97.312 Non-pressure chronic ulcer of right ankle with fat layer exposed Modifier: Quantity: Electronic Signature(s) Signed: 05/30/2020 4:57:12 PM By: Elliot Gurney, [name], RN, CWS, [name], [name] Signed: 05/31/2020 8:13:46 AM By: Baltazar Najjar MD Previous Signature: 05/29/2020 4:14:27 PM Version By: Baltazar Najjar MD Entered By: Elliot Gurney, [name], RN, CWS, Kim on 05/30/2020 16:57:12

## 2020-06-10 ENCOUNTER — Encounter (INDEPENDENT_AMBULATORY_CARE_PROVIDER_SITE_OTHER): Payer: Self-pay | Admitting: Nurse Practitioner

## 2020-06-10 ENCOUNTER — Encounter (INDEPENDENT_AMBULATORY_CARE_PROVIDER_SITE_OTHER): Payer: Self-pay

## 2020-06-14 ENCOUNTER — Other Ambulatory Visit (INDEPENDENT_AMBULATORY_CARE_PROVIDER_SITE_OTHER): Payer: Self-pay | Admitting: Vascular Surgery

## 2020-06-14 DIAGNOSIS — I83813 Varicose veins of bilateral lower extremities with pain: Secondary | ICD-10-CM

## 2020-06-17 ENCOUNTER — Encounter (INDEPENDENT_AMBULATORY_CARE_PROVIDER_SITE_OTHER): Payer: Self-pay | Admitting: Nurse Practitioner

## 2020-06-17 ENCOUNTER — Ambulatory Visit (INDEPENDENT_AMBULATORY_CARE_PROVIDER_SITE_OTHER): Payer: No Typology Code available for payment source | Admitting: Nurse Practitioner

## 2020-06-17 ENCOUNTER — Other Ambulatory Visit: Payer: Self-pay

## 2020-06-17 ENCOUNTER — Ambulatory Visit (INDEPENDENT_AMBULATORY_CARE_PROVIDER_SITE_OTHER): Payer: No Typology Code available for payment source

## 2020-06-17 ENCOUNTER — Encounter (INDEPENDENT_AMBULATORY_CARE_PROVIDER_SITE_OTHER): Payer: Self-pay

## 2020-06-17 VITALS — BP 139/81 | HR 69 | Resp 16 | Ht 62.0 in | Wt 163.2 lb

## 2020-06-17 DIAGNOSIS — E1140 Type 2 diabetes mellitus with diabetic neuropathy, unspecified: Secondary | ICD-10-CM

## 2020-06-17 DIAGNOSIS — E1169 Type 2 diabetes mellitus with other specified complication: Secondary | ICD-10-CM | POA: Diagnosis not present

## 2020-06-17 DIAGNOSIS — I83213 Varicose veins of right lower extremity with both ulcer of ankle and inflammation: Secondary | ICD-10-CM

## 2020-06-17 DIAGNOSIS — I83813 Varicose veins of bilateral lower extremities with pain: Secondary | ICD-10-CM | POA: Diagnosis not present

## 2020-06-17 DIAGNOSIS — E785 Hyperlipidemia, unspecified: Secondary | ICD-10-CM

## 2020-06-17 DIAGNOSIS — L97318 Non-pressure chronic ulcer of right ankle with other specified severity: Secondary | ICD-10-CM | POA: Diagnosis not present

## 2020-06-17 DIAGNOSIS — Z794 Long term (current) use of insulin: Secondary | ICD-10-CM

## 2020-06-19 ENCOUNTER — Encounter (INDEPENDENT_AMBULATORY_CARE_PROVIDER_SITE_OTHER): Payer: Self-pay | Admitting: Nurse Practitioner

## 2020-06-19 NOTE — Progress Notes (Signed)
Subjective:    Patient ID: Tammy Brewer, female    DOB: 05/08/1956, 64 y.o.   MRN: [id number] Chief Complaint  Patient presents with  . New Patient (Initial Visit)    ref stone venous stasis    The patient is seen for evaluation of symptomatic varicose veins. The patient relates burning and stinging which worsened steadily throughout the course of the day, particularly with standing. The patient also notes an aching and throbbing pain over the varicosities, particularly with prolonged dependent positions. The symptoms are significantly improved with elevation.  The patient also notes that during hot weather the symptoms are greatly intensified. The patient states the pain from the varicose veins interferes with work, daily exercise, shopping and household maintenance. At this point, the symptoms are persistent and severe enough that they're having a negative impact on lifestyle and are interfering with daily activities.  Most notably the patient also had a severe venous ulceration of the right medial ankle.  The patient was seen by wound care and was able to heal the wound but it took many months.  The patient severe varicose veins are likely a component.  There is no history of DVT, PE or superficial thrombophlebitis.  The patient denies a significant family history of varicose veins.   The patient has been wearing medical grade 1 compression stockings diligently for the last 6 months.  She also elevates her lower extremity as much as possible in addition to exercise.  Today noninvasive studies show reflux in the right common femoral vein.  There is also reflux in the great saphenous vein at the saphenofemoral junction extending to the proximal calf.  There is no evidence of DVT or superficial venous thrombosis in the right lower extremity.    Review of Systems  Cardiovascular: Positive for leg swelling.  Skin: Positive for wound.  All other systems reviewed and are negative.        Objective:   Physical Exam Vitals reviewed.  HENT:     Head: Normocephalic.  Cardiovascular:     Rate and Rhythm: Normal rate.     Pulses: Normal pulses.  Pulmonary:     Effort: Pulmonary effort is normal.  Skin:    General: Skin is warm and dry.     Comments: Healed medial ankle ulceration   Neurological:     Mental Status: She is alert and oriented to person, place, and time.  Psychiatric:        Mood and Affect: Mood normal.        Behavior: Behavior normal.        Thought Content: Thought content normal.        Judgment: Judgment normal.     BP 139/81 (BP Location: Right Arm)   Pulse 69   Resp 16   Ht 5\' 2"  (1.575 m)   Wt 163 lb 3.2 oz (74 kg)   BMI 29.85 kg/m   Past Medical History:  Diagnosis Date  . Anxiety   . Bell's palsy   . Diabetes mellitus without complication (HCC)   . High cholesterol     Social History   Socioeconomic History  . Marital status: Married    Spouse name: Not on file  . Number of children: Not on file  . Years of education: Not on file  . Highest education level: Not on file  Occupational History  . Not on file  Tobacco Use  . Smoking status: Never Smoker  . Smokeless tobacco: Never Used  Vaping Use  . Vaping Use: Never used  Substance and Sexual Activity  . Alcohol use: No  . Drug use: No  . Sexual activity: Not on file  Other Topics Concern  . Not on file  Social History Narrative  . Not on file   Social Determinants of Health   Financial Resource Strain:   . Difficulty of Paying Living Expenses: Not on file  Food Insecurity:   . Worried About Programme researcher, broadcasting/film/video in the Last Year: Not on file  . Ran Out of Food in the Last Year: Not on file  Transportation Needs:   . Lack of Transportation (Medical): Not on file  . Lack of Transportation (Non-Medical): Not on file  Physical Activity:   . Days of Exercise per Week: Not on file  . Minutes of Exercise per Session: Not on file  Stress:   . Feeling of Stress :  Not on file  Social Connections:   . Frequency of Communication with Friends and Family: Not on file  . Frequency of Social Gatherings with Friends and Family: Not on file  . Attends Religious Services: Not on file  . Active Member of Clubs or Organizations: Not on file  . Attends Banker Meetings: Not on file  . Marital Status: Not on file  Intimate Partner Violence:   . Fear of Current or Ex-Partner: Not on file  . Emotionally Abused: Not on file  . Physically Abused: Not on file  . Sexually Abused: Not on file    Past Surgical History:  Procedure Laterality Date  . APPENDECTOMY    . CHOLECYSTECTOMY    . DILATION AND CURETTAGE OF UTERUS    . 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   Procedure: TOTAL KNEE ARTHROPLASTY;  Surgeon: Kennedy Bucker, MD;  Location: [facility] ORS;  Service: Orthopedics;  Laterality: Right;  . TOTAL KNEE ARTHROPLASTY Left 08/23/2018   Procedure: TOTAL KNEE ARTHROPLASTY;  Surgeon: Kennedy Bucker, MD;  Location: [facility] ORS;  Service: Orthopedics;  Laterality: Left;    History reviewed. No pertinent family history.  No Known Allergies     Assessment & Plan:   1. Varicose veins of right lower extremity with inflammation, with ulcer of ankle of other severity (HCC) Recommend  I have reviewed my previous  discussion with the patient regarding  varicose veins and why they cause symptoms. Patient will continue  wearing graduated compression stockings class 1 on a daily basis, beginning first thing in the morning and removing them in the evening.    In addition, behavioral modification including elevation during the day was again discussed and this will continue.  The patient has utilized over the counter pain medications and has been exercising.  However, at this time conservative therapy has not alleviated the patient's symptoms of leg pain and swelling  Recommend: laser ablation of the rightgreat saphenous veins to eliminate the symptoms of pain  and swelling of the lower extremities caused by the severe superficial venous reflux disease, as well as to prevent further recurrence of right ankle ulceration.  2. Hyperlipidemia due to type 2 diabetes mellitus (HCC) Continue statin as ordered and reviewed, no changes at this time   3. Type 2 diabetes mellitus with diabetic neuropathy, with long-term current use of insulin (HCC) Continue hypoglycemic medications as already ordered, these medications have been reviewed and there are no changes at this time.  Hgb A1C to be monitored as already arranged by primary service    Current Outpatient Medications  on File Prior to Visit  Medication Sig Dispense Refill  . empagliflozin (JARDIANCE) 25 MG TABS tablet Take 25 mg by mouth daily.     Marland Kitchen glimepiride (AMARYL) 4 MG tablet Take 4 mg by mouth daily with breakfast.    . metFORMIN (GLUCOPHAGE-XR) 500 MG 24 hr tablet Take 500 mg by mouth 2 (two) times daily. At lunch and at bedtime    . rosuvastatin (CRESTOR) 5 MG tablet Take 5 mg by mouth at bedtime.     . bisacodyl (DULCOLAX) 5 MG EC tablet Take 1 tablet (5 mg total) by mouth daily as needed for moderate constipation. (Patient not taking: Reported on 06/17/2020) 30 tablet 0  . docusate sodium (COLACE) 100 MG capsule Take 1 capsule (100 mg total) by mouth 2 (two) times daily. (Patient not taking: Reported on 08/09/2018) 10 capsule 0  . methocarbamol (ROBAXIN) 500 MG tablet Take 1 tablet (500 mg total) by mouth every 6 (six) hours as needed for muscle spasms. (Patient not taking: Reported on 08/09/2018) 30 tablet 0  . oxyCODONE (OXY IR/ROXICODONE) 5 MG immediate release tablet Take 1-2 tablets (5-10 mg total) by mouth every 4 (four) hours as needed for moderate pain (pain score 4-6). (Patient not taking: Reported on 06/17/2020) 40 tablet 0  . triamcinolone ointment (KENALOG) 0.1 % Apply 1 application topically 2 (two) times daily. (Patient not taking: Reported on 06/17/2020)     No current  facility-administered medications on file prior to visit.    There are no Patient Instructions on file for this visit. No follow-ups on file.   Georgiana Spinner, NP

## 2021-04-22 ENCOUNTER — Encounter (HOSPITAL_BASED_OUTPATIENT_CLINIC_OR_DEPARTMENT_OTHER): Payer: No Typology Code available for payment source | Admitting: Internal Medicine

## 2021-04-30 ENCOUNTER — Encounter (HOSPITAL_BASED_OUTPATIENT_CLINIC_OR_DEPARTMENT_OTHER): Payer: No Typology Code available for payment source | Admitting: Internal Medicine

## 2022-09-04 ENCOUNTER — Other Ambulatory Visit: Payer: Self-pay | Admitting: Internal Medicine

## 2022-09-04 DIAGNOSIS — Z1231 Encounter for screening mammogram for malignant neoplasm of breast: Secondary | ICD-10-CM

## 2022-10-14 ENCOUNTER — Emergency Department: Payer: Medicare HMO

## 2022-10-14 ENCOUNTER — Encounter: Payer: Self-pay | Admitting: Emergency Medicine

## 2022-10-14 ENCOUNTER — Other Ambulatory Visit: Payer: Self-pay

## 2022-10-14 ENCOUNTER — Inpatient Hospital Stay
Admission: EM | Admit: 2022-10-14 | Discharge: 2022-10-16 | DRG: 813 | Disposition: A | Discharge status: Home / Self Care | Payer: Medicare HMO | Attending: Internal Medicine | Admitting: Internal Medicine

## 2022-10-14 DIAGNOSIS — D696 Thrombocytopenia, unspecified: Principal | ICD-10-CM

## 2022-10-14 DIAGNOSIS — E872 Acidosis, unspecified: Secondary | ICD-10-CM | POA: Insufficient documentation

## 2022-10-14 DIAGNOSIS — E878 Other disorders of electrolyte and fluid balance, not elsewhere classified: Secondary | ICD-10-CM | POA: Diagnosis present

## 2022-10-14 DIAGNOSIS — B349 Viral infection, unspecified: Secondary | ICD-10-CM | POA: Diagnosis present

## 2022-10-14 DIAGNOSIS — G510 Bell's palsy: Secondary | ICD-10-CM | POA: Diagnosis present

## 2022-10-14 DIAGNOSIS — A90 Dengue fever [classical dengue]: Secondary | ICD-10-CM | POA: Diagnosis present

## 2022-10-14 DIAGNOSIS — R7989 Other specified abnormal findings of blood chemistry: Secondary | ICD-10-CM

## 2022-10-14 DIAGNOSIS — E785 Hyperlipidemia, unspecified: Secondary | ICD-10-CM

## 2022-10-14 DIAGNOSIS — D693 Immune thrombocytopenic purpura: Secondary | ICD-10-CM | POA: Diagnosis present

## 2022-10-14 DIAGNOSIS — Z1152 Encounter for screening for COVID-19: Secondary | ICD-10-CM

## 2022-10-14 DIAGNOSIS — E861 Hypovolemia: Secondary | ICD-10-CM | POA: Diagnosis present

## 2022-10-14 DIAGNOSIS — Z96653 Presence of artificial knee joint, bilateral: Secondary | ICD-10-CM | POA: Diagnosis present

## 2022-10-14 DIAGNOSIS — F32A Depression, unspecified: Secondary | ICD-10-CM | POA: Insufficient documentation

## 2022-10-14 DIAGNOSIS — E7800 Pure hypercholesterolemia, unspecified: Secondary | ICD-10-CM | POA: Diagnosis present

## 2022-10-14 DIAGNOSIS — K8020 Calculus of gallbladder without cholecystitis without obstruction: Secondary | ICD-10-CM | POA: Insufficient documentation

## 2022-10-14 DIAGNOSIS — E871 Hypo-osmolality and hyponatremia: Secondary | ICD-10-CM

## 2022-10-14 DIAGNOSIS — E1165 Type 2 diabetes mellitus with hyperglycemia: Secondary | ICD-10-CM | POA: Diagnosis not present

## 2022-10-14 DIAGNOSIS — R6889 Other general symptoms and signs: Secondary | ICD-10-CM | POA: Diagnosis not present

## 2022-10-14 DIAGNOSIS — D691 Qualitative platelet defects: Secondary | ICD-10-CM | POA: Diagnosis present

## 2022-10-14 DIAGNOSIS — E876 Hypokalemia: Secondary | ICD-10-CM | POA: Insufficient documentation

## 2022-10-14 DIAGNOSIS — R7401 Elevation of levels of liver transaminase levels: Secondary | ICD-10-CM | POA: Diagnosis present

## 2022-10-14 DIAGNOSIS — E663 Overweight: Secondary | ICD-10-CM | POA: Diagnosis present

## 2022-10-14 DIAGNOSIS — Z7984 Long term (current) use of oral hypoglycemic drugs: Secondary | ICD-10-CM

## 2022-10-14 DIAGNOSIS — F419 Anxiety disorder, unspecified: Secondary | ICD-10-CM | POA: Diagnosis present

## 2022-10-14 LAB — TECHNOLOGIST SMEAR REVIEW: Plt Morphology: NORMAL

## 2022-10-14 LAB — CBC WITH DIFFERENTIAL/PLATELET
Abs Immature Granulocytes: 0.1 10*3/uL — ABNORMAL HIGH (ref 0.00–0.07)
Basophils Absolute: 0.1 10*3/uL (ref 0.0–0.1)
Basophils Relative: 1 %
Eosinophils Absolute: 0 10*3/uL (ref 0.0–0.5)
Eosinophils Relative: 0 %
HCT: 50.5 % — ABNORMAL HIGH (ref 36.0–46.0)
Hemoglobin: 17.4 g/dL — ABNORMAL HIGH (ref 12.0–15.0)
Immature Granulocytes: 1 %
Lymphocytes Relative: 57 %
Lymphs Abs: 5 10*3/uL — ABNORMAL HIGH (ref 0.7–4.0)
MCH: 31.4 pg (ref 26.0–34.0)
MCHC: 34.5 g/dL (ref 30.0–36.0)
MCV: 91.2 fL (ref 80.0–100.0)
Monocytes Absolute: 1.8 10*3/uL — ABNORMAL HIGH (ref 0.1–1.0)
Monocytes Relative: 21 %
Neutro Abs: 1.8 10*3/uL (ref 1.7–7.7)
Neutrophils Relative %: 20 %
Platelets: 14 10*3/uL — CL (ref 150–400)
RBC: 5.54 MIL/uL — ABNORMAL HIGH (ref 3.87–5.11)
RDW: 12.3 % (ref 11.5–15.5)
Smear Review: DECREASED
WBC: 8.8 10*3/uL (ref 4.0–10.5)
nRBC: 0 % (ref 0.0–0.2)

## 2022-10-14 LAB — RESP PANEL BY RT-PCR (RSV, FLU A&B, COVID)  RVPGX2
Influenza A by PCR: NEGATIVE
Influenza B by PCR: NEGATIVE
Resp Syncytial Virus by PCR: NEGATIVE
SARS Coronavirus 2 by RT PCR: NEGATIVE

## 2022-10-14 LAB — VITAMIN B12: Vitamin B-12: 3546 pg/mL — ABNORMAL HIGH (ref 180–914)

## 2022-10-14 LAB — COMPREHENSIVE METABOLIC PANEL
ALT: 628 U/L — ABNORMAL HIGH (ref 0–44)
AST: 1073 U/L — ABNORMAL HIGH (ref 15–41)
Albumin: 3.3 g/dL — ABNORMAL LOW (ref 3.5–5.0)
Alkaline Phosphatase: 241 U/L — ABNORMAL HIGH (ref 38–126)
Anion gap: 14 (ref 5–15)
BUN: 30 mg/dL — ABNORMAL HIGH (ref 8–23)
CO2: 18 mmol/L — ABNORMAL LOW (ref 22–32)
Calcium: 8.3 mg/dL — ABNORMAL LOW (ref 8.9–10.3)
Chloride: 97 mmol/L — ABNORMAL LOW (ref 98–111)
Creatinine, Ser: 0.75 mg/dL (ref 0.44–1.00)
GFR, Estimated: >60 mL/min (ref >60)
Glucose, Bld: 424 mg/dL — ABNORMAL HIGH (ref 70–99)
Potassium: 4 mmol/L (ref 3.5–5.1)
Sodium: 129 mmol/L — ABNORMAL LOW (ref 135–145)
Total Bilirubin: 1.4 mg/dL — ABNORMAL HIGH (ref 0.3–1.2)
Total Protein: 6.4 g/dL — ABNORMAL LOW (ref 6.5–8.1)

## 2022-10-14 LAB — PROTIME-INR
INR: 1 (ref 0.8–1.2)
Prothrombin Time: 13.4 seconds (ref 11.4–15.2)

## 2022-10-14 LAB — LACTATE DEHYDROGENASE: LDH: 1183 U/L — ABNORMAL HIGH (ref 98–192)

## 2022-10-14 LAB — FIBRINOGEN: Fibrinogen: 285 mg/dL (ref 210–475)

## 2022-10-14 LAB — IMMATURE PLATELET FRACTION: Immature Platelet Fraction: 9.3 % — ABNORMAL HIGH (ref 1.2–8.6)

## 2022-10-14 LAB — FOLATE: Folate: >40 ng/mL (ref >5.9)

## 2022-10-14 LAB — GLUCOSE, CAPILLARY: Glucose-Capillary: 266 mg/dL — ABNORMAL HIGH (ref 70–99)

## 2022-10-14 LAB — HEPATITIS PANEL, ACUTE
HCV Ab: NONREACTIVE
Hep A IgM: NONREACTIVE
Hep B C IgM: NONREACTIVE
Hepatitis B Surface Ag: NONREACTIVE

## 2022-10-14 LAB — APTT: aPTT: 41 seconds — ABNORMAL HIGH (ref 24–36)

## 2022-10-14 LAB — ACETAMINOPHEN LEVEL: Acetaminophen (Tylenol), Serum: <10 ug/mL — ABNORMAL LOW (ref 10–30)

## 2022-10-14 LAB — HIV ANTIBODY (ROUTINE TESTING W REFLEX): HIV Screen 4th Generation wRfx: NONREACTIVE

## 2022-10-14 MED ORDER — TRAZODONE HCL 50 MG PO TABS: 25.0000 mg | ORAL_TABLET | Freq: Every evening | ORAL | Status: DC | PRN

## 2022-10-14 MED ORDER — ONDANSETRON HCL 4 MG/2ML IJ SOLN
4.0000 mg | Freq: Four times a day (QID) | INTRAMUSCULAR | Status: DC | PRN
  Administered 2022-10-15: 4 mg via INTRAVENOUS
  Filled 2022-10-14: qty 2

## 2022-10-14 MED ORDER — MAGNESIUM HYDROXIDE 400 MG/5ML PO SUSP: 30.0000 mL | Freq: Every day | ORAL | Status: DC | PRN

## 2022-10-14 MED ORDER — GLIMEPIRIDE 4 MG PO TABS: 4.0000 mg | ORAL_TABLET | Freq: Every day | ORAL | Status: DC

## 2022-10-14 MED ORDER — EMPAGLIFLOZIN 25 MG PO TABS
25.0000 mg | ORAL_TABLET | Freq: Every day | ORAL | Status: DC
  Administered 2022-10-15: 25 mg via ORAL
  Filled 2022-10-14: qty 1

## 2022-10-14 MED ORDER — ROSUVASTATIN CALCIUM 10 MG PO TABS
5.0000 mg | ORAL_TABLET | Freq: Every day | ORAL | Status: DC
  Administered 2022-10-14: 5 mg via ORAL
  Filled 2022-10-14 (×2): qty 1

## 2022-10-14 MED ORDER — SODIUM CHLORIDE 0.9 % IV BOLUS
1000.0000 mL | Freq: Once | INTRAVENOUS | Status: AC
  Administered 2022-10-14: 1000 mL via INTRAVENOUS

## 2022-10-14 MED ORDER — ACETAMINOPHEN 325 MG PO TABS: 650.0000 mg | ORAL_TABLET | Freq: Four times a day (QID) | ORAL | Status: DC | PRN

## 2022-10-14 MED ORDER — LACTATED RINGERS IV BOLUS
1000.0000 mL | Freq: Once | INTRAVENOUS | Status: AC
  Administered 2022-10-14: 1000 mL via INTRAVENOUS

## 2022-10-14 MED ORDER — ONDANSETRON HCL 4 MG PO TABS: 4.0000 mg | ORAL_TABLET | Freq: Four times a day (QID) | ORAL | Status: DC | PRN

## 2022-10-14 MED ORDER — ACETAMINOPHEN 325 MG RE SUPP: 650.0000 mg | Freq: Four times a day (QID) | RECTAL | Status: DC | PRN

## 2022-10-14 MED ORDER — INSULIN ASPART 100 UNIT/ML IJ SOLN
0.0000 [IU] | Freq: Three times a day (TID) | INTRAMUSCULAR | Status: DC
  Administered 2022-10-14: 11 [IU] via SUBCUTANEOUS
  Administered 2022-10-15 (×3): 4 [IU] via SUBCUTANEOUS
  Administered 2022-10-16: 7 [IU] via SUBCUTANEOUS
  Filled 2022-10-14 (×5): qty 1

## 2022-10-14 NOTE — H&P (Addendum)
[facility]   PATIENT NAME: Tammy Brewer    MR#:  [id number]  DATE OF BIRTH:  November 15, 1955  DATE OF ADMISSION:  10/14/2022  PRIMARY CARE PHYSICIAN: Kirk Ruths, MD   Patient is coming from: Home  REQUESTING/REFERRING PHYSICIAN: Nance Pear, MD  CHIEF COMPLAINT:   Chief Complaint  Patient presents with   Abnormal Lab   Dizziness  The patient is Spanish-speaking and translation was provided via Str[employer] software.  HISTORY OF PRESENT ILLNESS:  Tammy Brewer is a 67 y.o. Hispanic female with medical history significant for type 2 diabetes mellitus, dyslipidemia and anxiety who presented to the ER after being evaluated echo normal clinic and had blood work that showed thrombocytopenia.  She recently came from [place] on Friday after spending a week there.  She stated that over the past day she has been having fever, chills, body aches, nausea, dizziness and headache as well as occasional abdominal pain and chest pain.  She denies any current abdominal pain or chest pain however she was noted to have right upper quadrant tenderness.  She admitted to arthralgia as well with her symptoms as well as 1 vision.  She has been taking Tylenol every 6 hours.  She does not recall mosquito bites there and stated that she has used mosquito repellents and that it is not common time for mosquitoes there.  She denied any sick exposure.  No dysuria, oliguria or hematuria or flank pain.  No dyspnea or cough or wheezing.  No bleeding diathesis.  ED Course: When patient came to the ER, vital signs were within normal.  Labs revealed hyponatremia and hypochloremia, CO2 of 18, hyperglycemia 424 BUN of 30 with creatinine 0.75 and calcium 8.3.  Her alk phos was 241, albumin 3.3 with total protein 6.4.  AST was significantly elevated at 1073 and a LT 628 with total bili of 1.4.  LDH was 1183.  CBC showed hemoconcentration with hemoglobin 17.4 hematocrit 50.5.  Platelets were 14.9 with 3% immature  platelet fraction and normal platelet morphology on blood smear.  Fibrinogen was 285.  PT and INR were normal PTT 41.  Tylenol level was less than 10.  Influenza, COVID-19 PCR and RSV PCR came back negative.  Blood cultures were drawn. EKG as reviewed by me : EKG showed sinus rhythm with a rate of 97 with minimal voltage criteria for LVH.. Imaging: Portable chest x-ray showed no acute cardiopulmonary disease.  Right upper quadrant ultrasound revealed her cholecystectomy with no acute abnormality.  Patient was given 1 L bolus of IV lactated Ringer and 1 L of IV normal saline.  The case was discussed with Dr. Judithann Graves with hematology and oncology who added additional blood work mentioned above.  The case was discussed with Dr. Tama High with ID will be consulted.  She will be admitted to a medical telemetry bed for further evaluation and management. PAST MEDICAL HISTORY:   Past Medical History:  Diagnosis Date   Anxiety    Bell's palsy    Diabetes mellitus without complication ([place])    High cholesterol     PAST SURGICAL HISTORY:   Past Surgical History:  Procedure Laterality Date   APPENDECTOMY     CHOLECYSTECTOMY     DILATION AND CURETTAGE OF UTERUS     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   Procedure: TOTAL KNEE ARTHROPLASTY;  Surgeon: Hessie Knows, MD;  Location: [facility] ORS;  Service: Orthopedics;  Laterality: Right;   TOTAL KNEE ARTHROPLASTY Left 08/23/2018  Procedure: TOTAL KNEE ARTHROPLASTY;  Surgeon: Hessie Knows, MD;  Location: [facility] OR[facility];  Service: Orthopedics;  Laterality: Left;    SOCIAL HISTORY:   Social History   Tobacco Use   Smoking status: Never   Smokeless tobacco: Never  Substance Use Topics   Alcohol use: No    FAMILY HISTORY:  History reviewed. No pertinent family history.  She denies any familial diseases.  DRUG ALLERGIES:  No Known Allergies  REVIEW OF SYSTEMS:   ROS As per history of present illness. All pertinent systems were reviewed above.  Constitutional, HEENT, cardiovascular, respiratory, GI, GU, musculoskeletal, neuro, psychiatric, endocrine, integumentary and hematologic systems were reviewed and are otherwise negative/unremarkable except for positive findings mentioned above in the HPI.   MEDICATIONS AT HOME:   Prior to Admission medications   Medication Sig Start Date End Date Taking? Authorizing Provider  bisacodyl (DULCOLAX) 5 MG EC tablet Take 1 tablet (5 mg total) by mouth daily as needed for moderate constipation. Patient not taking: Reported on 06/17/2020 08/25/18   Duanne Guess, PA-C  docusate sodium (COLACE) 100 MG capsule Take 1 capsule (100 mg total) by mouth 2 (two) times daily. Patient not taking: Reported on 08/09/2018 03/25/18   Duanne Guess, PA-C  empagliflozin (JARDIANCE) 25 MG TABS tablet Take 25 mg by mouth daily.     [provider]  glimepiride (AMARYL) 4 MG tablet Take 4 mg by mouth daily with breakfast.    [provider]  metFORMIN (GLUCOPHAGE-XR) 500 MG 24 hr tablet Take 500 mg by mouth 2 (two) times daily. At lunch and at bedtime    [provider]  methocarbamol (ROBAXIN) 500 MG tablet Take 1 tablet (500 mg total) by mouth every 6 (six) hours as needed for muscle spasms. Patient not taking: Reported on 08/09/2018 03/25/18   Duanne Guess, PA-C  oxyCODONE (OXY IR/ROXICODONE) 5 MG immediate release tablet Take 1-2 tablets (5-10 mg total) by mouth every 4 (four) hours as needed for moderate pain (pain score 4-6). Patient not taking: Reported on 06/17/2020 08/25/18   Duanne Guess, PA-C  rosuvastatin (CRESTOR) 5 MG tablet Take 5 mg by mouth at bedtime.     [provider]  triamcinolone ointment (KENALOG) 0.1 % Apply 1 application topically 2 (two) times daily. Patient not taking: Reported on 06/17/2020    [provider]      VITAL SIGNS:  Blood pressure 110/71, pulse 83, temperature 97.9 F (36.6 C), temperature source Oral, resp. rate 18, height  5' 2"$  (1.575 m), weight 68 kg, SpO2 97 %.  PHYSICAL EXAMINATION:  Physical Exam  GENERAL:  67 y.o.-year-old Hispanic female patient lying in the bed with no acute distress.  EYES: Pupils equal, round, reactive to light and accommodation. No scleral icterus. Extraocular muscles intact.  HEENT: Head atraumatic, normocephalic. Oropharynx and nasopharynx clear.  NECK:  Supple, no jugular venous distention. No thyroid enlargement, no tenderness.  LUNGS: Normal breath sounds bilaterally, no wheezing, rales,rhonchi or crepitation. No use of accessory muscles of respiration.  CARDIOVASCULAR: Regular rate and rhythm, S1, S2 normal. No murmurs, rubs, or gallops.  ABDOMEN: Soft, nondistended, with right upper quadrant tenderness without rebound tenderness guarding or rigidity.  Bowel sounds present. No organomegaly or mass.  EXTREMITIES: No pedal edema, cyanosis, or clubbing.  NEUROLOGIC: Cranial nerves II through XII are intact. Muscle strength 5/5 in all extremities. Sensation intact. Gait not checked.  PSYCHIATRIC: The patient is alert and oriented x 3.  Normal affect and good eye  contact. SKIN: No obvious rash, lesion, or ulcer.   LABORATORY PANEL:   CBC Recent Labs  Lab 10/14/22 1621  WBC 8.8  HGB 17.4*  HCT 50.5*  PLT 14*   ------------------------------------------------------------------------------------------------------------------  Chemistries  Recent Labs  Lab 10/14/22 1621  NA 129*  K 4.0  CL 97*  CO2 18*  GLUCOSE 424*  BUN 30*  CREATININE 0.75  CALCIUM 8.3*  AST 1,073*  ALT 628*  ALKPHOS 241*  BILITOT 1.4*   ------------------------------------------------------------------------------------------------------------------  Cardiac Enzymes No results for input(s): "TROPONINI" in the last 168 hours. ------------------------------------------------------------------------------------------------------------------  RADIOLOGY:  DG Chest Portable 1 View  Result  Date: 10/14/2022 CLINICAL DATA:  Body aches EXAM: PORTABLE CHEST 1 VIEW COMPARISON:  03/24/2018 FINDINGS: The heart size and mediastinal contours are within normal limits. Both lungs are clear. The visualized skeletal structures are unremarkable. IMPRESSION: No active disease. Electronically Signed   By: Ulyses Jarred M.D.   On: 10/14/2022 18:56   US Abdomen Limited RUQ (LIVER/GB)  Result Date: 10/14/2022 CLINICAL DATA:  Elevated LFTs EXAM: ULTRASOUND ABDOMEN LIMITED RIGHT UPPER QUADRANT COMPARISON:  None Available. FINDINGS: Gallbladder: Surgically rim Common bile duct: Diameter: Mm Liver: No focal lesion identified. Within normal limits in parenchymal echogenicity. Portal vein is patent on color Doppler imaging with normal direction of blood flow towards the liver. Other: None. IMPRESSION: Status post cholecystectomy.  No acute abnormality noted. Electronically Signed   By: Inez Catalina M.D.   On: 10/14/2022 18:19      IMPRESSION AND PLAN:  Assessment and Plan: * Thrombocytopathia [facility]) - The patient will be admitted to a medical telemetry bed. - Given associated fever, myalgia and arthralgia, body aches, headache and dizziness, this concerns me about a tropical viral etiology in which thrombocytopenia is reactive. - Differential diagnoses would include dengue fever, Zika virus , Chikungunya etc.. - We will hold off her Amaryl as it can be associated with thrombocytopenia. - We will avoid aspirin and NSAIDs. - Her blood pressure will be carefully monitored. - Hematology consult to be obtained. - Dr. Janese Banks was notified and is aware about patient. - ID consult will be obtained. - Dr. Delaine Lame was notified and is aware about the patient.  Will defer serology workup to her discretion.  Elevated LFTs - This could be related to this viral syndrome. - Crestor, Actos and trazodone will be held off. - We will follow acute viral hepatitis panel. - GI consult will be obtained. - I notified Dr.  Alice Reichert about the patient. - Will follow LFTs with hydration and supportive management.  Uncontrolled type 2 diabetes mellitus with hyperglycemia, without long-term current use of insulin (HCC) - We will hold off metformin and Amaryl. - She will be placed on supplemental coverage with NovoLog. - We will continue Jardiance.  Hyponatremia - This is likely hypovolemic with associated mild metabolic acidosis and could be related to pseudohyponatremia from hyperglycemia. - We will follow sodium level with hydration.  Dyslipidemia - We will hold off statin therapy given significantly elevated LFTs.  Depression - We will continue Zoloft and hold off trazodone.    DVT prophylaxis: SCDs.  Medical prophylaxis is contraindicated due to severe thrombocytopenia. Advanced Care Planning:  Code Status: full code.  Family Communication:  The plan of care was discussed in details with the patient (and family). I answered all questions. The patient agreed to proceed with the above mentioned plan. Further management will depend upon hospital course. Disposition Plan: Back to previous home environment Consults called: Hematology and  ID as well as gastroenterology. All the records are reviewed and case discussed with ED provider.  Status is: Inpatient   At the time of the admission, it appears that the appropriate admission status for this patient is inpatient.  This is judged to be reasonable and necessary in order to provide the required intensity of service to ensure the patient's safety given the presenting symptoms, physical exam findings and initial radiographic and laboratory data in the context of comorbid conditions.  The patient requires inpatient status due to high intensity of service, high risk of further deterioration and high frequency of surveillance required.  I certify that at the time of admission, it is my clinical judgment that the patient will require inpatient hospital care extending  more than 2 midnights.                            Dispo: The patient is from: Home              Anticipated d/c is to: Home              Patient currently is not medically stable to d/c.              Difficult to place patient: No  Christel Mormon M.D on 10/14/2022 at 10:34 PM  [facility]   From 7 PM-7 AM, contact night-coverage [email or web address]  CC: Primary care physician; Kirk Ruths, MD

## 2022-10-14 NOTE — ED Provider Notes (Signed)
[facility] Provider Note    Event Date/Time   First MD Initiated Contact with Patient 10/14/22 1613     (approximate)   History   Abnormal Lab and Dizziness   HPI  Tammy Brewer is a 67 y.o. female who presents to the emergency department after being evaluated at [facility] clinic.  She says that they checked blood work and found her to have a low platelet count.  The patient went to [facility] clinic because she states over the past 4 days she has had body aches, fevers and nausea.  Patient states that the symptoms have been fairly constant.  She has been trying to use Tylenol and Motrin to help control her symptoms without any significant relief. The patient denies any known sick contacts. Just returned from [place] 5 days ago.     Physical Exam   Triage Vital Signs: ED Triage Vitals  Enc Vitals Group     BP 10/14/22 1210 110/73     Pulse Rate 10/14/22 1210 94     Resp 10/14/22 1210 18     Temp 10/14/22 1213 98.2 F (36.8 C)     Temp Source 10/14/22 1213 Oral     SpO2 10/14/22 1210 97 %     Weight 10/14/22 1214 150 lb (68 kg)     Height 10/14/22 1214 5' 2"$  (1.575 m)     Head Circumference --      Peak Flow --      Pain Score 10/14/22 1210 3     Pain Loc --      Pain Edu? --      Excl. in [place]? --     Most recent vital signs: Vitals:   10/14/22 1213 10/14/22 1605  BP:  128/84  Pulse:  94  Resp:  18  Temp: 98.2 F (36.8 C)   SpO2:  96%   General: Awake, alert, oriented. CV:  Good peripheral perfusion. Regular rate and rhythm Resp:  Normal effort. Lungs clear. Abd:  No distention. Tender in the epigastric region.   ED Results / Procedures / Treatments   Labs (all labs ordered are listed, but only abnormal results are displayed) Labs Reviewed  CBC WITH DIFFERENTIAL/PLATELET - Abnormal; Notable for the following components:      Result Value   RBC 5.54 (*)    Hemoglobin 17.4 (*)    HCT 50.5 (*)    Platelets 14 (*)    Lymphs Abs 5.0  (*)    Monocytes Absolute 1.8 (*)    Abs Immature Granulocytes 0.10 (*)    All other components within normal limits  COMPREHENSIVE METABOLIC PANEL - Abnormal; Notable for the following components:   Sodium 129 (*)    Chloride 97 (*)    CO2 18 (*)    Glucose, Bld 424 (*)    BUN 30 (*)    Calcium 8.3 (*)    Total Protein 6.4 (*)    Albumin 3.3 (*)    AST 1,073 (*)    ALT 628 (*)    Alkaline Phosphatase 241 (*)    Total Bilirubin 1.4 (*)    All other components within normal limits  APTT - Abnormal; Notable for the following components:   aPTT 41 (*)    All other components within normal limits  ACETAMINOPHEN LEVEL - Abnormal; Notable for the following components:   Acetaminophen (Tylenol), Serum <10 (*)    All other components within normal limits  RESP PANEL BY RT-PCR (  RSV, FLU A&B, COVID)  [id number]  CULTURE, BLOOD (ROUTINE X 2)  CULTURE, BLOOD (ROUTINE X 2)  PROTIME-INR  LACTATE DEHYDROGENASE  IMMATURE PLATELET FRACTION  VITAMIN B12  FOLATE  HIV ANTIBODY (ROUTINE TESTING W REFLEX)  HEPATITIS PANEL, ACUTE  TECHNOLOGIST SMEAR REVIEW  FIBRINOGEN  PARASITE EXAM, BLOOD     EKG  I, Nance Pear, attending physician, personally viewed and interpreted this EKG  EKG Time: 1217 Rate: 97 Rhythm: normal sinus rhythm Axis: left axis deviation Intervals: qtc 447 QRS: narrow, LVH ST changes: no st elevation Impression: abnormal ekg    RADIOLOGY I independently interpreted and visualized the CXR. My interpretation: No pneumonia Radiology interpretation:  IMPRESSION:  No active disease.    I independently interpreted and visualized the RUQ [place]. My interpretation: No large mass Radiology interpretation:  IMPRESSION:  Status post cholecystectomy.  No acute abnormality noted.     PROCEDURES:  Critical Care performed: No  Procedures   MEDICATIONS ORDERED IN ED: Medications  sodium chloride 0.9 % bolus 1,000 mL (has no administration in time range)      IMPRESSION / MDM / ASSESSMENT AND PLAN / ED COURSE  I reviewed the triage vital signs and the nursing notes.                              Differential diagnosis includes, but is not limited to, infection, ITP, toxicity  Patient's presentation is most consistent with acute presentation with potential threat to life or bodily function.   Patient presented to the emergency department today from [facility] clinic because of concerns for low platelets found on blood work there.  Patient has been planing of 4 days of fevers, body aches, nausea and lightheadedness.  Patient is afebrile here.  Blood work does show the thrombocytopenia.  Additionally LFTs are elevated.  No leukocytosis.  Did obtain a right upper quadrant ultrasound which did not show any concerning findings.  This time it did have somewhat concern for possible infection.  Discussed with Dr. Janese Banks with heme/onc who will add additional blood work. Discussed with Dr. Delaine Lame with ID who had additional recommendations. Discussed with Dr. Sidney Ace who will plan on admission.  FINAL CLINICAL IMPRESSION(S) / ED DIAGNOSES   Final diagnoses:  Thrombocytopenia (HCC)  Elevated LFTs        Note:  This document was prepared using Dragon voice recognition software and may include unintentional dictation errors.    Nance Pear, MD 10/14/22 [place]

## 2022-10-14 NOTE — ED Notes (Signed)
Patient resting in bed free from sign of distress. Breathing unlabored speaking in full sentences with symmetric chest rise and fall. Bed low and locked with side rails raised x2. Call bell in reach and monitor in place.   

## 2022-10-14 NOTE — Assessment & Plan Note (Addendum)
-   This could be related to this viral syndrome. - Crestor, Actos and trazodone will be held off. - We will follow acute viral hepatitis panel. - GI consult will be obtained. - I notified Dr. Alice Reichert about the patient. - Will follow LFTs with hydration and supportive management.

## 2022-10-14 NOTE — ED Notes (Signed)
First Nurse Note: Patient sent from [facility] for low platelet count of 15.

## 2022-10-14 NOTE — Assessment & Plan Note (Signed)
-   We will continue Zoloft and hold off trazodone.

## 2022-10-14 NOTE — Assessment & Plan Note (Addendum)
-   The patient will be admitted to a medical telemetry bed. - Given associated fever, myalgia and arthralgia, body aches, headache and dizziness, this concerns me about a tropical viral etiology in which thrombocytopenia is reactive. - Differential diagnoses would include dengue fever, Zika virus , Chikungunya etc.. - We will hold off her Amaryl as it can be associated with thrombocytopenia. - We will avoid aspirin and NSAIDs. - Her blood pressure will be carefully monitored. - Hematology consult to be obtained. - Dr. Janese Banks was notified and is aware about patient. - ID consult will be obtained. - Dr. Delaine Lame was notified and is aware about the patient.  Will defer serology workup to her discretion.

## 2022-10-14 NOTE — Assessment & Plan Note (Signed)
-   This is likely hypovolemic with associated mild metabolic acidosis and could be related to pseudohyponatremia from hyperglycemia. - We will follow sodium level with hydration.

## 2022-10-14 NOTE — Assessment & Plan Note (Addendum)
-   We will hold off metformin and Amaryl. - She will be placed on supplemental coverage with NovoLog. - We will continue Jardiance.

## 2022-10-14 NOTE — ED Triage Notes (Signed)
Using interpreter, Pt reports nausea and dizziness for 4 days. Endorses spinal pain. Denies emesis, just states she hasn't been able to eat anything. Pt had labs at [facility] and would not like repeat labs at this time.

## 2022-10-14 NOTE — Assessment & Plan Note (Signed)
-   We will hold off statin therapy given significantly elevated LFTs.

## 2022-10-15 ENCOUNTER — Other Ambulatory Visit (HOSPITAL_COMMUNITY): Payer: Self-pay

## 2022-10-15 DIAGNOSIS — E1165 Type 2 diabetes mellitus with hyperglycemia: Secondary | ICD-10-CM | POA: Diagnosis not present

## 2022-10-15 DIAGNOSIS — D691 Qualitative platelet defects: Secondary | ICD-10-CM

## 2022-10-15 DIAGNOSIS — R7989 Other specified abnormal findings of blood chemistry: Secondary | ICD-10-CM | POA: Diagnosis not present

## 2022-10-15 DIAGNOSIS — D696 Thrombocytopenia, unspecified: Secondary | ICD-10-CM | POA: Diagnosis not present

## 2022-10-15 DIAGNOSIS — K8020 Calculus of gallbladder without cholecystitis without obstruction: Secondary | ICD-10-CM | POA: Insufficient documentation

## 2022-10-15 DIAGNOSIS — E876 Hypokalemia: Secondary | ICD-10-CM | POA: Diagnosis not present

## 2022-10-15 DIAGNOSIS — E663 Overweight: Secondary | ICD-10-CM | POA: Insufficient documentation

## 2022-10-15 DIAGNOSIS — E872 Acidosis, unspecified: Secondary | ICD-10-CM | POA: Insufficient documentation

## 2022-10-15 DIAGNOSIS — R6889 Other general symptoms and signs: Secondary | ICD-10-CM | POA: Diagnosis not present

## 2022-10-15 LAB — HEPATIC FUNCTION PANEL
ALT: 485 U/L — ABNORMAL HIGH (ref 0–44)
AST: 724 U/L — ABNORMAL HIGH (ref 15–41)
Albumin: 2.8 g/dL — ABNORMAL LOW (ref 3.5–5.0)
Alkaline Phosphatase: 190 U/L — ABNORMAL HIGH (ref 38–126)
Bilirubin, Direct: 0.2 mg/dL (ref 0.0–0.2)
Indirect Bilirubin: 0.9 mg/dL (ref 0.3–0.9)
Total Bilirubin: 1.1 mg/dL (ref 0.3–1.2)
Total Protein: 5.4 g/dL — ABNORMAL LOW (ref 6.5–8.1)

## 2022-10-15 LAB — BASIC METABOLIC PANEL
Anion gap: 10 (ref 5–15)
BUN: 16 mg/dL (ref 8–23)
CO2: 21 mmol/L — ABNORMAL LOW (ref 22–32)
Calcium: 7.9 mg/dL — ABNORMAL LOW (ref 8.9–10.3)
Chloride: 103 mmol/L (ref 98–111)
Creatinine, Ser: 0.37 mg/dL — ABNORMAL LOW (ref 0.44–1.00)
GFR, Estimated: >60 mL/min (ref >60)
Glucose, Bld: 170 mg/dL — ABNORMAL HIGH (ref 70–99)
Potassium: 3.3 mmol/L — ABNORMAL LOW (ref 3.5–5.1)
Sodium: 134 mmol/L — ABNORMAL LOW (ref 135–145)

## 2022-10-15 LAB — FERRITIN: Ferritin: 5695 ng/mL — ABNORMAL HIGH (ref 11–307)

## 2022-10-15 LAB — GLUCOSE, CAPILLARY
Glucose-Capillary: 192 mg/dL — ABNORMAL HIGH (ref 70–99)
Glucose-Capillary: 167 mg/dL — ABNORMAL HIGH (ref 70–99)
Glucose-Capillary: 156 mg/dL — ABNORMAL HIGH (ref 70–99)
Glucose-Capillary: 105 mg/dL — ABNORMAL HIGH (ref 70–99)
Glucose-Capillary: 98 mg/dL (ref 70–99)

## 2022-10-15 LAB — CBC
HCT: 44.2 % (ref 36.0–46.0)
Hemoglobin: 15.4 g/dL — ABNORMAL HIGH (ref 12.0–15.0)
MCH: 31.4 pg (ref 26.0–34.0)
MCHC: 34.8 g/dL (ref 30.0–36.0)
MCV: 90.2 fL (ref 80.0–100.0)
Platelets: 21 10*3/uL — CL (ref 150–400)
RBC: 4.9 MIL/uL (ref 3.87–5.11)
RDW: 12.4 % (ref 11.5–15.5)
WBC: 11.2 10*3/uL — ABNORMAL HIGH (ref 4.0–10.5)
nRBC: 0 % (ref 0.0–0.2)

## 2022-10-15 LAB — HEPATITIS PANEL, ACUTE
HCV Ab: NONREACTIVE
Hep A IgM: NONREACTIVE
Hep B C IgM: NONREACTIVE
Hepatitis B Surface Ag: NONREACTIVE

## 2022-10-15 LAB — HEMOGLOBIN A1C
Hgb A1c MFr Bld: 12.5 % — ABNORMAL HIGH (ref 4.8–5.6)
Mean Plasma Glucose: 312.05 mg/dL

## 2022-10-15 LAB — PATHOLOGIST SMEAR REVIEW

## 2022-10-15 LAB — CK: Total CK: 28 U/L — ABNORMAL LOW (ref 38–234)

## 2022-10-15 MED ORDER — POTASSIUM CHLORIDE CRYS ER 20 MEQ PO TBCR
40.0000 meq | EXTENDED_RELEASE_TABLET | ORAL | Status: AC
  Administered 2022-10-15 (×2): 40 meq via ORAL
  Filled 2022-10-15 (×2): qty 2

## 2022-10-15 MED ORDER — PANTOPRAZOLE SODIUM 40 MG PO TBEC
40.0000 mg | DELAYED_RELEASE_TABLET | Freq: Every day | ORAL | Status: DC
  Administered 2022-10-15 – 2022-10-16 (×2): 40 mg via ORAL
  Filled 2022-10-15 (×2): qty 1

## 2022-10-15 MED ORDER — INSULIN GLARGINE-YFGN 100 UNIT/ML ~~LOC~~ SOLN
6.0000 [IU] | Freq: Every day | SUBCUTANEOUS | Status: DC
  Administered 2022-10-15 – 2022-10-16 (×2): 6 [IU] via SUBCUTANEOUS
  Filled 2022-10-15 (×2): qty 0.06

## 2022-10-15 NOTE — Progress Notes (Signed)
Admission screening and assessment completing utilizing interpreter with interpreter Renard Matter [phone]

## 2022-10-15 NOTE — Inpatient Diabetes Management (Signed)
Inpatient Diabetes Program Recommendations  AACE/ADA: New Consensus Statement on Inpatient Glycemic Control (2015)  Target Ranges:  Prepandial:   less than 140 mg/dL      Peak postprandial:   less than 180 mg/dL (1-2 hours)      Critically ill patients:  140 - 180 mg/dL    Latest Reference Range & Units 10/14/22 19:00  Hemoglobin A1C 4.8 - 5.6 % 12.5 (H)  312 mg/dl  (H): Data is abnormally high  Latest Reference Range & Units 10/14/22 21:16 10/15/22 08:02  Glucose-Capillary 70 - 99 mg/dL 266 (H) 192 (H)  4 units Novolog   (H): Data is abnormally high   Admit with:  Thrombocytopathia--Low Platelets/ Elevated Hgb/Hct Elevated LFTs  Uncontrolled type 2 diabetes mellitus with hyperglycemia   History: DM2  Home DM Meds: Jardiance 25 mg daily        Amaryl 4 mg daily        Metformin 500 mg BID        Actos 30 mg daily (NOT taking)        Rybelsus 7 mg daily (Just added)  Current Orders: Novolog Resistant Correction Scale/ SSI (0-20 units) TID AC + HS     Jardiance 25 mg daily    PCP: Dr. Ouida Sills with [facility] Last seen 09/04/2022 Looks like pt was off all her Diabetes meds prior to this visit??  MD- Note A1c was 15.3% at last PCP appt (Aug 28, 2022).  Rybelsus added at that visit and Amaryl, Metformin, and Jardiance renewed.  Pt had not taken any meds for about 1 month prior to PCP visit (may have been longer).  A1c down to 12.5%, but may need insulin for home??  Pt requesting CBG meter Rx for home as well (Order [id number])     Met w/ pt at bedside around 12pm with the assistance of Jacqui (on site interpreter).  Pt verified she saw her PCP Dr. Ouida Sills in January.  Told me she had stopped her diabetes meds and was not checking CBGs for about 1 month prior to seeing her PCP (unsure why pt was not taking meds and pt told me her CBG meter was broken).  Unsure why pt did not request CBG meter from PCP??  Has follow up appt with PCP in April.  Pt told me she was taking the  Amaryl, Metformin, and Jardiance in the past.  Rybelsus was just recently added.  A1c in January was 15.3%--Now down to 12.5%.  We reviewed her Current A1c and the need to check CBGs and take meds as prescribed.  Explained what an A1c is and what it measures.  Reminded patient that her goal A1c is 7% or less per ADA standards to prevent both acute and long-term complications.  Explained to patient the extreme importance of good glucose control at home.  Encouraged patient to check her CBGs at least bid at home (fasting and another check within the day) and to record all CBGs in a logbook for her PCP to review--Also reviewed long-term CBG goals for home.  Will request CBG meter Rx at time of d/c.     --Will follow patient during hospitalization--  Wyn Quaker RN, MSN, [place] Diabetes Coordinator Inpatient Glycemic Control Team Team Pager: [phone] (8a-5p)

## 2022-10-15 NOTE — Consult Note (Signed)
NAME: Tammy Brewer  DOB: 08/05/1956  MRN: [id number]  Date/Time: 10/15/2022 2:42 PM  REQUESTING PROVIDER: Dr.Mansy Subjective:  REASON FOR CONSULT: viral illness ? Tammy Brewer is a 67 y.o. with a history of DM presents with 4 day h/o fever, chills, bodyache and fatigue Pt had gone to [place] on 2/9 for a week to visit family. She was doing fine while there in [place]. She denies [place] mosquito bites though there mosquitoes. Her husband went with her and he is fine After coming back to the [place] on 10/09/22 she developed fatigue, body ache. Subjective fever and chills- She has been taking tylenol 1gram every 6 hours for the past 3-4 days Had runny nose but no cough /sorethroat She went to [facility] on 10/12/22 and was diagnosed with acute bacterial sinsusitis and given prednisone, azithromycin and Tessalon. She tested negative for covid/flu that day / As there was no improvement she went back on 10/14/22 and labs revealed abnormal LFTS and she sent to the  ED . In the ED vitals  10/14/22  BP 110/71  Temp 97.9 F (36.6 C)  Pulse Rate 83  Resp 18  SpO2 97 %  Weight 156 lb 4.9 oz  Height 5' 3"$  (1.6 m)  BMI (Calculated) 27.7    Latest Reference Range & Units 10/14/22  WBC 4.0 - 10.5 K/uL 8.8  Hemoglobin 12.0 - 15.0 g/dL 17.4 (H)  HCT 36.0 - 46.0 % 50.5 (H)  Platelets 150 - 400 K/uL 14 (LL) [1]  Creatinine 0.44 - 1.00 mg/dL 0.75   She was seen by oncologist for low platelets and I asked to see her for ruling out viral illness  Past Medical History:  Diagnosis Date   Anxiety    Bell's palsy    Diabetes mellitus without complication ([place])    High cholesterol     Past Surgical History:  Procedure Laterality Date   APPENDECTOMY     CHOLECYSTECTOMY     DILATION AND CURETTAGE OF UTERUS     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   Procedure: TOTAL KNEE ARTHROPLASTY;  Surgeon: Hessie Knows, MD;  Location: [facility] ORS;  Service: Orthopedics;  Laterality: Right;   TOTAL KNEE  ARTHROPLASTY Left 08/23/2018   Procedure: TOTAL KNEE ARTHROPLASTY;  Surgeon: Hessie Knows, MD;  Location: [facility] ORS;  Service: Orthopedics;  Laterality: Left;    Social History   Socioeconomic History   Marital status: Married    Spouse name: Not on file   Number of children: Not on file   Years of education: Not on file   Highest education level: Not on file  Occupational History   Not on file  Tobacco Use   Smoking status: Never   Smokeless tobacco: Never  Vaping Use   Vaping Use: Never used  Substance and Sexual Activity   Alcohol use: No   Drug use: No   Sexual activity: Not on file  Other Topics Concern   Not on file  Social History Narrative   Not on file   Social Determinants of Health   Financial Resource Strain: Not on file  Food Insecurity: No Food Insecurity (10/14/2022)   Hunger Vital Sign    Worried About Running Out of Food in the Last Year: Never true    Ran Out of Food in the Last Year: Never true  Transportation Needs: No Transportation Needs (10/14/2022)   PRAPARE - Hydrologist (Medical): No    Lack of Transportation (Non-Medical):  No  Physical Activity: Not on file  Stress: Not on file  Social Connections: Not on file  Intimate Partner Violence: Not At Risk (10/14/2022)   Humiliation, Afraid, Rape, and Kick questionnaire    Fear of Current or Ex-Partner: No    Emotionally Abused: No    Physically Abused: No    Sexually Abused: No    History reviewed. No pertinent family history. No Known Allergies I? Current Facility-Administered Medications  Medication Dose Route Frequency Provider Last Rate Last Admin   insulin aspart (novoLOG) injection 0-20 Units  0-20 Units Subcutaneous TID AC & HS Mansy, Arvella Merles, MD   4 Units at 10/15/22 1243   insulin glargine-yfgn [facility]) injection 6 Units  6 Units Subcutaneous Daily Sharen Hones, MD   6 Units at 10/15/22 1352   magnesium hydroxide (MILK OF MAGNESIA) suspension 30 mL  30 mL  Oral Daily PRN Mansy, Jan A, MD       ondansetron [facility]) tablet 4 mg  4 mg Oral Q6H PRN Mansy, Jan A, MD       Or   ondansetron [facility]) injection 4 mg  4 mg Intravenous Q6H PRN Mansy, Jan A, MD   4 mg at 10/15/22 1059   pantoprazole (PROTONIX) EC tablet 40 mg  40 mg Oral Daily Sharen Hones, MD   40 mg at 10/15/22 1353     Abtx:  Anti-infectives (From admission, onward)    None       REVIEW OF SYSTEMS:  Const:  fever, chills, negative weight loss Eyes: negative diplopia or visual changes, negative eye pain ENT: ++coryza, negative sore throat Resp: negative cough, hemoptysis, dyspnea Cards: negative for chest pain, palpitations, lower extremity edema GU: negative for frequency, dysuria and hematuria GI: Negative for abdominal pain, diarrhea, bleeding, constipation Skin: negative for rash and pruritus Heme: negative for easy bruising but had gum bleeding  MS:  myalgias, arthralgias, back pain and muscle weakness Neurolo:negative for headaches, dizziness, vertigo, memory problems  Psych: negative for feelings of anxiety, depression  Endocrine:  diabetes Allergy/Immunology- negative for any medication or food allergies ?  Objective:  VITALS:  BP 116/69 (BP Location: Left Arm)   Pulse 92   Temp (!) 97.4 F (36.3 C)   Resp 14   Ht 5' 3"$  (1.6 m)   Wt 70.9 kg   SpO2 96%   BMI 27.69 kg/m   PHYSICAL EXAM:  General: Alert, cooperative, no distress, appears stated age.  Head: Normocephalic, without obvious abnormality, atraumatic. Eyes: Conjunctivae clear, anicteric sclerae. Pupils are equal ENT Nares normal. No drainage or sinus tenderness. Lips, mucosa, and tongue normal. No Thrush Neck: Supple, symmetrical, no adenopathy, thyroid: non tender no carotid bruit and no JVD. Back: No CVA tenderness. Lungs: Clear to auscultation bilaterally. No Wheezing or Rhonchi. No rales. Heart: Regular rate and rhythm, no murmur, rub or gallop. Abdomen: Soft, non-tender,not distended.  Bowel sounds normal. No masses Extremities: atraumatic, no cyanosis. No edema. No clubbing Skin: faint erythematous color of the skin over arms and thighs       Lymph: Cervical, supraclavicular normal. Neurologic: Grossly non-focal Pertinent Labs Lab Results CBC    Component Value Date/Time   WBC 11.2 (H) 10/15/2022 0418   RBC 4.90 10/15/2022 0418   HGB 15.4 (H) 10/15/2022 0418   HCT 44.2 10/15/2022 0418   PLT 21 (LL) 10/15/2022 0418   MCV 90.2 10/15/2022 0418   MCH 31.4 10/15/2022 0418   MCHC 34.8 10/15/2022 0418   RDW 12.4 10/15/2022 0418  LYMPHSABS 5.0 (H) 10/14/2022 1621   MONOABS 1.8 (H) 10/14/2022 1621   EOSABS 0.0 10/14/2022 1621   BASOSABS 0.1 10/14/2022 1621       Latest Ref Rng & Units 10/15/2022    4:18 AM 10/14/2022    4:21 PM 08/24/2018    3:25 AM  CMP  Glucose 70 - 99 mg/dL 170  424  160   BUN 8 - 23 mg/dL 16  30  14   $ Creatinine 0.44 - 1.00 mg/dL 0.37  0.75  0.38   Sodium 135 - 145 mmol/L 134  129  137   Potassium 3.5 - 5.1 mmol/L 3.3  4.0  3.5   Chloride 98 - 111 mmol/L 103  97  110   CO2 22 - 32 mmol/L 21  18  22   $ Calcium 8.9 - 10.3 mg/dL 7.9  8.3  8.2   Total Protein 6.5 - 8.1 g/dL 5.4  6.4    Total Bilirubin 0.3 - 1.2 mg/dL 1.1  1.4    Alkaline Phos 38 - 126 U/L 190  241    AST 15 - 41 U/L 724  1,073    ALT 0 - 44 U/L 485  628        Microbiology: Recent Results (from the past 240 hour(s))  Resp panel by RT-PCR (RSV, Flu A&B, Covid) Anterior Nasal Swab     Status: None   Collection Time: 10/14/22  5:24 PM   Specimen: Anterior Nasal Swab  Result Value Ref Range Status   SARS Coronavirus 2 by RT PCR NEGATIVE NEGATIVE Final    Comment: (NOTE) SARS-CoV-2 target nucleic acids are NOT DETECTED.  The SARS-CoV-2 RNA is generally detectable in upper respiratory specimens during the acute phase of infection. The lowest concentration of SARS-CoV-2 viral copies this assay can detect is 138 copies/mL. A negative result does not preclude  SARS-Cov-2 infection and should not be used as the sole basis for treatment or other patient management decisions. A negative result may occur with  improper specimen collection/handling, submission of specimen other than nasopharyngeal swab, presence of viral mutation(s) within the areas targeted by this assay, and inadequate number of viral copies(<138 copies/mL). A negative result must be combined with clinical observations, patient history, and epidemiological information. The expected result is Negative.  Fact Sheet for Patients:  EntrepreneurPulse.com.au  Fact Sheet for Healthcare Providers:  IncredibleEmployment.be  This test is no t yet approved or cleared by the [place] FDA and  has been authorized for detection and/or diagnosis of SARS-CoV-2 by FDA under an Emergency Use Authorization (EUA). This EUA will remain  in effect (meaning this test can be used) for the duration of the COVID-19 declaration under Section 564(b)(1) of the Act, 21 U.S.C.section 360bbb-3(b)(1), unless the authorization is terminated  or revoked sooner.       Influenza A by PCR NEGATIVE NEGATIVE Final   Influenza B by PCR NEGATIVE NEGATIVE Final    Comment: (NOTE) The Xpert Xpress SARS-CoV-2/FLU/RSV plus assay is intended as an aid in the diagnosis of influenza from Nasopharyngeal swab specimens and should not be used as a sole basis for treatment. Nasal washings and aspirates are unacceptable for Xpert Xpress SARS-CoV-2/FLU/RSV testing.  Fact Sheet for Patients: EntrepreneurPulse.com.au  Fact Sheet for Healthcare Providers: IncredibleEmployment.be  This test is not yet approved or cleared by the [place] FDA and has been authorized for detection and/or diagnosis of SARS-CoV-2 by FDA under an Emergency Use Authorization (EUA). This EUA will remain in effect (  meaning this test can be used) for the duration of  the COVID-19 declaration under Section 564(b)(1) of the Act, 21 U.S.C. section 360bbb-3(b)(1), unless the authorization is terminated or revoked.     Resp Syncytial Virus by PCR NEGATIVE NEGATIVE Final    Comment: (NOTE) Fact Sheet for Patients: EntrepreneurPulse.com.au  Fact Sheet for Healthcare Providers: IncredibleEmployment.be  This test is not yet approved or cleared by the [place] FDA and has been authorized for detection and/or diagnosis of SARS-CoV-2 by FDA under an Emergency Use Authorization (EUA). This EUA will remain in effect (meaning this test can be used) for the duration of the COVID-19 declaration under Section 564(b)(1) of the Act, 21 U.S.C. section 360bbb-3(b)(1), unless the authorization is terminated or revoked.  Performed at [facility], [address] [place], [place] [address]   Blood culture (routine x 2)     Status: None (Preliminary result)   Collection Time: 10/14/22  8:01 PM   Specimen: BLOOD  Result Value Ref Range Status   Specimen Description BLOOD BLOOD RIGHT FOREARM  Final   Special Requests   Final    BOTTLES DRAWN AEROBIC AND ANAEROBIC Blood Culture results may not be optimal due to an inadequate volume of blood received in culture bottles   Culture   Final    NO GROWTH < 12 HOURS Performed at [facility], [address] [place], [place] [address]    Report Status PENDING  Incomplete  Blood culture (routine x 2)     Status: None (Preliminary result)   Collection Time: 10/14/22  8:01 PM   Specimen: BLOOD  Result Value Ref Range Status   Specimen Description BLOOD RIGHT ANTECUBITAL  Final   Special Requests   Final    BOTTLES DRAWN AEROBIC AND ANAEROBIC Blood Culture results may not be optimal due to an inadequate volume of blood received in culture bottles   Culture   Final    NO GROWTH < 12 HOURS Performed at [facility], [address] [place], [place]  [address]    Report Status PENDING  Incomplete    IMAGING RESULTS: Cxr no infiltrate I have personally reviewed the films ? Impression/Recommendation ?67 yr female with DM presenting with a viral illness like picture on returning from [place], [place] Severe thrombocytopenia, abnormal Lfts, hemoconcentration- favors Dengue acute infection D.D Tylenol induced transaminitis  Other infections like hepatitis neg CMV less likely Resp viral illness No evidence of bacterial infection  Dengue tests ( Pcr/antibodies sent to [facility])  Malaria is not seen in [place]?  Symptomatic management   DM  B/l TKA ? ___________________________________________________ Discussed with patient, and her husband and requesting provider Note:  This document was prepared using Dragon voice recognition software and may include unintentional dictation errors.

## 2022-10-15 NOTE — Hospital Course (Signed)
Tammy Brewer is a 67 y.o. Hispanic female with medical history significant for type 2 diabetes mellitus, dyslipidemia and anxiety who presented to the ER after being evaluated in clinic and had blood work that showed thrombocytopenia.  She recently came from [place] on Friday after spending a week there.  She stated that over the past day she has been having subject fever, chills, body aches, nausea, dizziness and headache as well as occasional abdominal pain and chest pain.  In the emergency room, she was found to have elevated liver function panel, severe thrombocytopenia of 21.  Patient does not seem to have any bleeding.  She was admitted to the hospital for further workup.

## 2022-10-15 NOTE — Progress Notes (Addendum)
Progress Note   Patient: Tammy Brewer [id number] DOB: Feb 14, 1956 DOA: 10/14/2022     1 DOS: the patient was seen and examined on 10/15/2022   Brief hospital course: Tammy Brewer is a 67 y.o. Hispanic female with medical history significant for type 2 diabetes mellitus, dyslipidemia and anxiety who presented to the ER after being evaluated in clinic and had blood work that showed thrombocytopenia.  She recently came from [place] on Friday after spending a week there.  She stated that over the past day she has been having subject fever, chills, body aches, nausea, dizziness and headache as well as occasional abdominal pain and chest pain.  In the emergency room, she was found to have elevated liver function panel, severe thrombocytopenia of 21.  Patient does not seem to have any bleeding.  She was admitted to the hospital for further workup.   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 Condition appears after flulike symptoms.  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  Peripheral smear did not show RBC morphology changes, not consistent with TTP. Patient currently does not have active bleeding, no need for platelet transfusion. Initial viral infection could have resolved, patient no longer has any fever, or flulike symptoms.  Patient does not seem to have any skin petechiae. Continue to monitor patient for another day, likely discharge home tomorrow. Consult from hematology and ID obtained.  Elevated LFTs Status post cholecystectomy. Acute hepatitis panel and HIV screen negative. Appears to be secondary to viral syndrome.  Continue to  follow.  Uncontrolled type 2 diabetes mellitus with hyperglycemia, without long-term current use of insulin (HCC) Hemoglobin A1c 12.5, patient will need insulin.  Will start insulin glargine and sliding scale insulin.  Hyponatremia Hypokalemia. Mild metabolic acidosis. Replete potassium, recheck BMP tomorrow.  Dyslipidemia Statin on hold.  Depression - We will continue Zoloft and hold off trazodone.       Subjective:  Patient still feeling nausea, no vomiting.  She had some loose stools on Sunday, still has some nausea.  Still complaining of mild right upper quadrant pain.  Physical Exam: Vitals:   10/14/22 2031 10/15/22 0031 10/15/22 0501 10/15/22 0803  BP: 110/71 111/70 114/72 116/69  Pulse: 83 90 87 92  Resp: 18 16 16 14  $ Temp: 97.9 F (36.6 C) 98.2 F (36.8 C) 98.1 F (36.7 C) (!) 97.4 F (36.3 C)  TempSrc: Oral Oral Oral   SpO2: 97% 95% 98% 96%  Weight: 70.9 kg     Height: 5' 3"$  (1.6 m)      General exam: Appears calm and comfortable  Respiratory system: Clear to auscultation. Respiratory effort normal. Cardiovascular system: S1 & S2 heard, RRR. No JVD, murmurs, rubs, gallops or clicks. No pedal edema. Gastrointestinal system: Abdomen is nondistended, soft and upper abdominal tender. No organomegaly or masses felt. Normal bowel sounds heard. Central nervous system: Alert and oriented. No focal neurological deficits. Extremities: Symmetric 5 x 5 power. Skin: No rashes, lesions or ulcers Psychiatry: Judgement and insight appear normal. Mood & affect appropriate.    Data Reviewed:  Reviewed the chest x-ray and abdominal ultrasound results, lab results.  Family Communication: Husband at the bedside.  Disposition: Status  is: Inpatient Remains inpatient appropriate because: Severity of disease.     Time spent: 35 minutes  Author: Sharen Hones, MD 10/15/2022 12:40 PM  For on call review [email or web address].

## 2022-10-15 NOTE — Consult Note (Signed)
GI Inpatient Consult Note  Reason for Consult: Elevated LFTs   Attending Requesting Consult: Dr. Eugenie Norrie  History of Present Illness: Tammy Brewer is a 67 y.o. female seen for evaluation of elevated LFTs at the request of admitting hospitalist - Dr. Eugenie Norrie. Patient has a PMH of HLD, anxiety, depression, and T2DM. She presented to the [facility] ED from the [facility] for abnormal labs showing elevated LFTs and severe thrombocytopenia. Upon presentation to the ED, all vital signs were within normal limits. Labs were significant for hemoglobin 17.4, hematocrit 50.5, platelets 14K with 3% immature platelet fraction and normal platelet morphology on peripheral blood smear, sodium 129, potassium 4.0, glucose 424, BUN 30, serum creatinine 0.75, albumin 3.3, AST 1073, ALT 628, alk phos 241, total bilirubin 1.4, and INR 1.0. Acute respiratory panel was negative. Chest x-ray negative for any acute findings. RUQ [place] negative and showed s/p cholecystectomy state. She was given 1L bolus IV LR and 1L IV normal salnie. Hematology and ID were consulted as well as GI for elevated LFTs.   Patient seen and examined this morning resting comfortably in hospital bed. She is non-English speaking and video interpretor used to help with history, Shea Stakes [phone]). Husband present with patient in room. No acute overnight events. She reports she and her husband were visiting family in [place] last week and got back to the states on Friday (6 days ago). Husband reports they both developed symptoms starting couple days after being home with low-grade fever, chills, malaise, abdominal pain, nausea, body aches, and headache. They went to the walk in clinic on 2/19 this week and were treated with Z-pak, prednisone, and benzonatate. Husband's symptoms improved, but patient's symptoms did not. She went to the [facility] yesterday where labs showed severe thrombocytopenia and elevated LFTs and advised to come to the  hospital. LFTs are improving today with AST 724, ALT 485, alk phos 190, and total bilirubin 1.1. She denies any abdominal pain currently. She denies any GI bleeding or easy bruising. She denies any known history of chronic liver disease. No herbal supplements or vitamins. No high-risk sexual behaviors or tattoos.   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[place]) 10/14/2022   Uncontrolled type 2 diabetes mellitus with hyperglycemia, without long-term current use of insulin ([place]) 10/14/2022   Elevated LFTs 10/14/2022   Depression 10/14/2022   Thrombocytopenia ([place]) 10/14/2022   Hyponatremia 10/14/2022   Dyslipidemia 10/14/2022   Status post total knee replacement using cement, 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   Status post total knee replacement using cement, right 03/22/2018   History of Bell's palsy 05/13/2016   Health care maintenance 01/14/2016   Depression, major, in remission ([place]) 10/22/2014   Hyperlipidemia due to type 2 diabetes mellitus ([place]) 11/09/2013   Osteoarthrosis involving lower leg 11/09/2013   Type 2 diabetes mellitus with diabetic neuropathy ([place]) 11/09/2013    Past Surgical History: Past Surgical History:  Procedure Laterality Date   APPENDECTOMY     CHOLECYSTECTOMY     DILATION AND CURETTAGE OF UTERUS     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   Procedure: TOTAL KNEE ARTHROPLASTY;  Surgeon: Hessie Knows, MD;  Location: [facility] ORS;  Service: Orthopedics;  Laterality: Right;   TOTAL KNEE ARTHROPLASTY Left 08/23/2018   Procedure: TOTAL KNEE ARTHROPLASTY;  Surgeon: Hessie Knows, MD;  Location: [facility] ORS;  Service: Orthopedics;  Laterality: Left;    Allergies: No Known Allergies  Home Medications: Medications Prior to  Admission  Medication Sig Dispense Refill Last Dose   bisacodyl (DULCOLAX) 5 MG EC tablet Take 1 tablet (5 mg total) by mouth daily as needed for moderate constipation. (Patient not taking: Reported on 06/17/2020) 30 tablet 0    docusate sodium (COLACE) 100 MG capsule Take 1 capsule (100 mg total) by mouth 2 (two) times daily. (Patient not taking: Reported on 08/09/2018) 10 capsule 0    empagliflozin (JARDIANCE) 25 MG TABS tablet Take 25 mg by mouth daily.       glimepiride (AMARYL) 4 MG tablet Take 4 mg by mouth daily with breakfast.      metFORMIN (GLUCOPHAGE-XR) 500 MG 24 hr tablet Take 500 mg by mouth 2 (two) times daily. At lunch and at bedtime      methocarbamol (ROBAXIN) 500 MG tablet Take 1 tablet (500 mg total) by mouth every 6 (six) hours as needed for muscle spasms. (Patient not taking: Reported on 08/09/2018) 30 tablet 0    oxyCODONE (OXY IR/ROXICODONE) 5 MG immediate release tablet Take 1-2 tablets (5-10 mg total) by mouth every 4 (four) hours as needed for moderate pain (pain score 4-6). (Patient not taking: Reported on 06/17/2020) 40 tablet 0    rosuvastatin (CRESTOR) 5 MG tablet Take 5 mg by mouth at bedtime.       triamcinolone ointment (KENALOG) 0.1 % Apply 1 application topically 2 (two) times daily. (Patient not taking: Reported on 06/17/2020)      Home medication reconciliation was completed with the patient.   Scheduled Inpatient Medications:    empagliflozin  25 mg Oral Daily   insulin aspart  0-20 Units Subcutaneous TID AC & HS   potassium chloride  40 mEq Oral Q2H    PRN Inpatient Medications:  magnesium hydroxide, ondansetron **OR** ondansetron (ZOFRAN) IV  Family History: family history is not on file.  The patient's family history is negative for inflammatory bowel disorders, GI malignancy, or solid organ transplantation.  Social History:   reports that she has never smoked. She has never used smokeless tobacco. She reports that she does not drink alcohol and  does not use drugs. The patient denies ETOH, tobacco, or drug use.   Review of Systems: Constitutional: Weight is stable.  Eyes: No changes in vision. ENT: No oral lesions, sore throat.  GI: see HPI.  Heme/Lymph: No easy bruising.  CV: No chest pain.  GU: No hematuria.  Integumentary: No rashes.  Neuro: No headaches.  Psych: No depression/anxiety.  Endocrine: No heat/cold intolerance.  Allergic/Immunologic: No urticaria.  Resp: No cough, SOB.  Musculoskeletal: No joint swelling.    Physical Examination: BP 116/69 (BP Location: Left Arm)   Pulse 92   Temp (!) 97.4 F (36.3 C)   Resp 14   Ht 5' 3"$  (1.6 m)   Wt 70.9 kg   SpO2 96%   BMI 27.69 kg/m  Gen: NAD, alert and oriented x 4 HEENT: PEERLA, EOMI, Neck: supple, no JVD or thyromegaly Chest: CTA bilaterally, no wheezes, crackles, or other adventitious sounds CV: RRR, no m/g/c/r Abd: soft, NT, ND, +BS in all four quadrants; no HSM, guarding, ridigity, or rebound tenderness Ext: no edema, well perfused with 2+ pulses, Skin: no rash or lesions noted Lymph: no LAD  Data:  Lab Results  Component Value Date   WBC 11.2 (H) 10/15/2022   HGB 15.4 (H) 10/15/2022   HCT 44.2 10/15/2022   MCV 90.2 10/15/2022   PLT 21 (LL) 10/15/2022   Recent Labs  Lab 10/14/22 1621 10/15/22 0418  HGB 17.4* 15.4*   Lab Results  Component Value Date   NA 134 (L) 10/15/2022   K 3.3 (L) 10/15/2022   CL 103 10/15/2022   CO2 21 (L) 10/15/2022   BUN 16 10/15/2022   CREATININE 0.37 (L) 10/15/2022   Lab Results  Component Value Date   ALT 485 (H) 10/15/2022   AST 724 (H) 10/15/2022   ALKPHOS 190 (H) 10/15/2022   BILITOT 1.1 10/15/2022   Recent Labs  Lab 10/14/22 1621  APTT 41*  INR 1.0    Assessment/Plan:  67 y/o Hispanic female with a PMH of HLD, T2DM, anxiety, and depression presented to the [facility] ED yesterday afternoon for chief complaint of abnormal labs showing elevated LFTs and thrombocytopenia.   Elevated LFTs - mainly  hepatocellular pattern, improving, clinical presentation consistent with acute viral syndrome  Thrombocytopenia - likely reactive from viral infection  Uncontrolled T2DM  Anxiety/Depression  Recommendations:  - Continue supportive care with pain control, antiemetics, and IV fluid hydration PRN per primary team - Continue to follow LFTs. LFTs are improving currently.  - Hematology and ID following. Appreciate recs.  - RUQ [place] negative with no evidence of cholestasis, biliary dilatation, or morphological features of cirrhosis. INR within normal limits. - No overt gastrointestinal bleeding or significant hemodynamic changes  - No further GI work-up at this time - Continue supportive care and observation - GI will sign off at this time and follow peripherally - ADAT  Thank you for the consult. Please call with questions or concerns.  Geanie Kenning, PA-C [facility] [phone]

## 2022-10-15 NOTE — Consult Note (Signed)
Hematology/Oncology Consult note [facility] [phone] [phone]  Patient Care Team: Kirk Ruths, MD as PCP - General (Internal Medicine)   Name of the patient: Tammy Brewer  [id number]  October 15, 1955    Reason for consult: thrombocytopenia   Session: Dr. Sidney Ace  Date of visit: 10/15/2022    History of presenting illness- Patient is a 67 year old female with a past medical history significant for hyperlipidemia and type 2 diabetes.  She was in [place] for a week and returned a few days ago.  Since then she has been having bodyaches fevers chills lightheadedness and occasional abdominal pain and headache.  She denies any sick contacts during her visit and denies any insect or tick bites.  She has not been taking any new medications or over-the-counter herbal medications.  She was found to have severe thrombocytopenia and was sent to theER.  On admission CBC showed a white cell count of 8.8, H&H of 17.4/50.5 with a platelet count of 14.  Today her platelet counts are 21.  She was also noted to have significantly elevated liver function tests with an AST of 1073, ALT 628 and alkaline phosphatase of 241 with a bilirubin of 1.4 which is improving today.  She denies any bleeding or bruising.   Pain scale- 3   Review of systems- Review of Systems  Constitutional:  Positive for malaise/fatigue. Negative for chills, fever and weight loss.  HENT:  Negative for congestion, ear discharge and nosebleeds.   Eyes:  Negative for blurred vision.  Respiratory:  Negative for cough, hemoptysis, sputum production, shortness of breath and wheezing.   Cardiovascular:  Negative for chest pain, palpitations, orthopnea and claudication.  Gastrointestinal:  Positive for abdominal pain and nausea. Negative for blood in stool, constipation, diarrhea, heartburn, melena and vomiting.  Genitourinary:  Negative for dysuria, flank pain, frequency, hematuria and  urgency.  Musculoskeletal:  Negative for back pain, joint pain and myalgias.  Skin:  Negative for rash.  Neurological:  Negative for dizziness, tingling, focal weakness, seizures, weakness and headaches.  Endo/Heme/Allergies:  Does not bruise/bleed easily.  Psychiatric/Behavioral:  Negative for depression and suicidal ideas. The patient does not have insomnia.     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   Status post total knee replacement using cement, right 03/22/2018   History of Bell's palsy 05/13/2016   Health care maintenance 01/14/2016   Depression, major, in remission ([place]) 10/22/2014   Hyperlipidemia due to type 2 diabetes mellitus ([place]) 11/09/2013   Osteoarthrosis involving lower leg 11/09/2013   Type 2 diabetes mellitus with diabetic neuropathy ([place]) 11/09/2013     Past Medical History:  Diagnosis Date   Anxiety    Bell's palsy    Diabetes mellitus without complication ([place])    High cholesterol      Past Surgical History:  Procedure Laterality Date   APPENDECTOMY     CHOLECYSTECTOMY     DILATION AND CURETTAGE OF UTERUS     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   Procedure: TOTAL KNEE ARTHROPLASTY;  Surgeon: Hessie Knows, MD;  Location: [facility] ORS;  Service: Orthopedics;  Laterality: Right;   TOTAL KNEE ARTHROPLASTY Left 08/23/2018   Procedure: TOTAL KNEE ARTHROPLASTY;  Surgeon: Hessie Knows, MD;  Location: [facility] ORS;  Service: Orthopedics;  Laterality: Left;    Social History   Socioeconomic History   Marital  status: Married    Spouse name: Not on file   Number of children: Not on file   Years of education: Not on file   Highest education level: Not on file  Occupational History   Not on file  Tobacco Use   Smoking status: Never   Smokeless tobacco: Never  Vaping Use   Vaping Use: Never used  Substance and Sexual Activity   Alcohol use: No   Drug use: No   Sexual activity: Not on file  Other Topics Concern   Not on file  Social History Narrative   Not on file   Social Determinants of Health   Financial Resource Strain: Not on file  Food Insecurity: No Food Insecurity (10/14/2022)   Hunger Vital Sign    Worried About Running Out of Food in the Last Year: Never true    Ran Out of Food in the Last Year: Never true  Transportation Needs: No Transportation Needs (10/14/2022)   PRAPARE - Hydrologist (Medical): No    Lack of Transportation (Non-Medical): No  Physical Activity: Not on file  Stress: Not on file  Social Connections: Not on file  Intimate Partner Violence: Not At Risk (10/14/2022)   Humiliation, Afraid, Rape, and Kick questionnaire    Fear of Current or Ex-Partner: No    Emotionally Abused: No    Physically Abused: No    Sexually Abused: No     History reviewed. No pertinent family history.   Current Facility-Administered Medications:    empagliflozin (JARDIANCE) tablet 25 mg, 25 mg, Oral, Daily, Mansy, Jan A, MD   insulin aspart (novoLOG) injection 0-20 Units, 0-20 Units, Subcutaneous, TID AC & HS, Mansy, Jan A, MD, 4 Units at 10/15/22 0818   magnesium hydroxide (MILK OF MAGNESIA) suspension 30 mL, 30 mL, Oral, Daily PRN, Mansy, Jan A, MD   ondansetron (ZOFRAN) tablet 4 mg, 4 mg, Oral, Q6H PRN **OR** ondansetron (ZOFRAN) injection 4 mg, 4 mg, Intravenous, Q6H PRN, Mansy, Jan A, MD   potassium chloride SA (KLOR-CON M) CR tablet 40 mEq, 40 mEq, Oral, Q2H, Sharen Hones, MD, 40 mEq at 10/15/22 0819   Physical exam:  Vitals:   10/14/22  2031 10/15/22 0031 10/15/22 0501 10/15/22 0803  BP: 110/71 111/70 114/72 116/69  Pulse: 83 90 87 92  Resp: 18 16 16 14  $ Temp: 97.9 F (36.6 C) 98.2 F (36.8 C) 98.1 F (36.7 C) (!) 97.4 F (36.3 C)  TempSrc: Oral Oral Oral   SpO2: 97% 95% 98% 96%  Weight: 156 lb 4.9 oz (70.9 kg)     Height: 5' 3"$  (1.6 m)      Physical Exam Cardiovascular:     Rate and Rhythm: Normal rate and regular rhythm.     Heart sounds: Normal heart sounds.  Pulmonary:     Effort: Pulmonary effort is normal.     Breath sounds: Normal breath sounds.  Abdominal:     General: Bowel sounds are normal.     Palpations: Abdomen is soft.     Comments: TTP in the epigastrium and LUQ. No palpable splenomegaly  Lymphadenopathy:     Comments: No palpable cervical, supraclavicular, axillary or inguinal adenopathy    Skin:  General: Skin is warm and dry.  Neurological:     Mental Status: She is alert and oriented to person, place, and time.           Latest Ref Rng & Units 10/15/2022    4:18 AM  CMP  Glucose 70 - 99 mg/dL 170   BUN 8 - 23 mg/dL 16   Creatinine 0.44 - 1.00 mg/dL 0.37   Sodium 135 - 145 mmol/L 134   Potassium 3.5 - 5.1 mmol/L 3.3   Chloride 98 - 111 mmol/L 103   CO2 22 - 32 mmol/L 21   Calcium 8.9 - 10.3 mg/dL 7.9   Total Protein 6.5 - 8.1 g/dL 5.4   Total Bilirubin 0.3 - 1.2 mg/dL 1.1   Alkaline Phos 38 - 126 U/L 190   AST 15 - 41 U/L 724   ALT 0 - 44 U/L 485       Latest Ref Rng & Units 10/15/2022    4:18 AM  CBC  WBC 4.0 - 10.5 K/uL 11.2   Hemoglobin 12.0 - 15.0 g/dL 15.4   Hematocrit 36.0 - 46.0 % 44.2   Platelets 150 - 400 K/uL 21     @IMAGES$ @  DG Chest Portable 1 View  Result Date: 10/14/2022 CLINICAL DATA:  Body aches EXAM: PORTABLE CHEST 1 VIEW COMPARISON:  03/24/2018 FINDINGS: The heart size and mediastinal contours are within normal limits. Both lungs are clear. The visualized skeletal structures are unremarkable. IMPRESSION: No active disease. Electronically  Signed   By: Ulyses Jarred M.D.   On: 10/14/2022 18:56   US Abdomen Limited RUQ (LIVER/GB)  Result Date: 10/14/2022 CLINICAL DATA:  Elevated LFTs EXAM: ULTRASOUND ABDOMEN LIMITED RIGHT UPPER QUADRANT COMPARISON:  None Available. FINDINGS: Gallbladder: Surgically rim Common bile duct: Diameter: Mm Liver: No focal lesion identified. Within normal limits in parenchymal echogenicity. Portal vein is patent on color Doppler imaging with normal direction of blood flow towards the liver. Other: None. IMPRESSION: Status post cholecystectomy.  No acute abnormality noted. Electronically Signed   By: Inez Catalina M.D.   On: 10/14/2022 18:19    Assessment and plan- Patient is a 67 y.o. female with recent travel to [place] admitted for acute hepatitis and thrombocytopenia  Thrombocytopenia:Patient had a normal platelet count of 203 in 2020 and I do not have any other interim CBCs for her for comparison.  She has isolated thrombocytopenia in the absence of other cytopenias and therefore the likelihood of bone marrow disorder is low.  However on pathology review patient noted to have increase in absolute lymphocytosis with presence of large lymphocytes.  These can be seen in viral infections but lymphoproliferative disorder is also a possibility and therefore I am ordering flow cytometry for her.  PT PTT INR and fibrinogen is normal and therefore she is not in DIC.  Her LDH is significantly elevated but that can be seen in acute inflammatory conditions of the liver in the setting of viral infections as well.  There were no schistocytes seen in the peripheral smear and I therefore do not think that the clinically presentation is consistent with TTP  Immature platelet fraction was elevated indicating of either a recovering bone marrow versus peripheral destruction.  However I would not like to treat her with high-dose steroids empirically for ITP especially given that her liver functions are also elevated.  I suspect there  is some kind of viral etiology ongoing and I would expect her platelet counts to get better in due course.  ID is also on board.  No indication for bone marrow biopsy at this time B12 and folate were normal and hepatitis testing was negative.  Elevated ferritin levels also indicative of an acute inflammatory process blood cultures have been negative so far.  Continue watchful monitoring for now   Thank you for this kind referral and the opportunity to participate in the care of this patient   Visit Diagnosis 1. Thrombocytopenia (HCC)   2. Elevated LFTs     Dr. Randa Evens, MD, MPH [facility] [id number] 10/15/2022 12:40 PM

## 2022-10-16 DIAGNOSIS — D691 Qualitative platelet defects: Secondary | ICD-10-CM

## 2022-10-16 DIAGNOSIS — R7989 Other specified abnormal findings of blood chemistry: Secondary | ICD-10-CM

## 2022-10-16 DIAGNOSIS — E871 Hypo-osmolality and hyponatremia: Secondary | ICD-10-CM

## 2022-10-16 DIAGNOSIS — E1165 Type 2 diabetes mellitus with hyperglycemia: Secondary | ICD-10-CM

## 2022-10-16 LAB — BASIC METABOLIC PANEL
Anion gap: 9 (ref 5–15)
BUN: 10 mg/dL (ref 8–23)
CO2: 24 mmol/L (ref 22–32)
Calcium: 8.2 mg/dL — ABNORMAL LOW (ref 8.9–10.3)
Chloride: 104 mmol/L (ref 98–111)
Creatinine, Ser: 0.44 mg/dL (ref 0.44–1.00)
GFR, Estimated: >60 mL/min (ref >60)
Glucose, Bld: 97 mg/dL (ref 70–99)
Potassium: 4 mmol/L (ref 3.5–5.1)
Sodium: 137 mmol/L (ref 135–145)

## 2022-10-16 LAB — HEPATIC FUNCTION PANEL
ALT: 334 U/L — ABNORMAL HIGH (ref 0–44)
AST: 435 U/L — ABNORMAL HIGH (ref 15–41)
Albumin: 2.9 g/dL — ABNORMAL LOW (ref 3.5–5.0)
Alkaline Phosphatase: 161 U/L — ABNORMAL HIGH (ref 38–126)
Bilirubin, Direct: 0.2 mg/dL (ref 0.0–0.2)
Indirect Bilirubin: 0.8 mg/dL (ref 0.3–0.9)
Total Bilirubin: 1 mg/dL (ref 0.3–1.2)
Total Protein: 5.8 g/dL — ABNORMAL LOW (ref 6.5–8.1)

## 2022-10-16 LAB — CBC
HCT: 43.4 % (ref 36.0–46.0)
Hemoglobin: 14.6 g/dL (ref 12.0–15.0)
MCH: 31.1 pg (ref 26.0–34.0)
MCHC: 33.6 g/dL (ref 30.0–36.0)
MCV: 92.3 fL (ref 80.0–100.0)
Platelets: 32 10*3/uL — ABNORMAL LOW (ref 150–400)
RBC: 4.7 MIL/uL (ref 3.87–5.11)
RDW: 12.6 % (ref 11.5–15.5)
WBC: 9.1 10*3/uL (ref 4.0–10.5)
nRBC: 0 % (ref 0.0–0.2)

## 2022-10-16 LAB — MISC LABCORP TEST (SEND OUT): Labcorp test code: 163084

## 2022-10-16 LAB — GLUCOSE, CAPILLARY
Glucose-Capillary: 88 mg/dL (ref 70–99)
Glucose-Capillary: 244 mg/dL — ABNORMAL HIGH (ref 70–99)

## 2022-10-16 LAB — SPOTTED FEVER GROUP ANTIBODIES
Spotted Fever Group IgG: <1:64 {titer}
Spotted Fever Group IgM: <1:64 {titer}

## 2022-10-16 LAB — MAGNESIUM: Magnesium: 2.2 mg/dL (ref 1.7–2.4)

## 2022-10-16 MED ORDER — PANTOPRAZOLE SODIUM 40 MG PO TBEC: 40.0000 mg | DELAYED_RELEASE_TABLET | Freq: Every day | ORAL | 0 refills | Status: AC

## 2022-10-16 NOTE — Discharge Summary (Signed)
Physician Discharge Summary   Patient: Tammy Brewer MRN: [id number] DOB: [birth year]  Admit date:     10/14/2022  Discharge date: 10/16/22  Discharge Physician: Sharen Hones   PCP: Kirk Ruths, MD   Recommendations at discharge:   PCP in 1 week. Follow-up with hematology in 1 week. Follow-up with ID in 10 days.  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 *  Hospital Course: Tammy Brewer is a 67 y.o. Hispanic female with medical history significant for type 2 diabetes mellitus, dyslipidemia and anxiety who presented to the ER after being evaluated in clinic and had blood work that showed thrombocytopenia.  She recently came from [place] on Friday after spending a week there.  She stated that over the past day she has been having subject fever, chills, body aches, nausea, dizziness and headache as well as occasional abdominal pain and chest pain.  In the emergency room, she was found to have elevated liver function panel, severe thrombocytopenia of 21.  Patient does not seem to have any bleeding.  She was admitted to the hospital for further workup. She is seen by ID and hematology.  Overall, her condition is stable.  Placed to the cot increased to 32 from 21.  She is tolerating diet, no complaints today.  Medically stable to be discharged. 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 Condition appears after flulike symptoms.  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  Peripheral smear did not show RBC morphology changes, not consistent with TTP. Patient currently does not have active bleeding, no need for platelet  transfusion. Initial viral infection could have resolved, patient no longer has any fever, or flulike symptoms.  Patient does not seem to have any skin petechiae. Patient is seen by ID, favor diagnosis of dengue fever. Patient be followed by ID in 10 days to review lab test sent out to [facility].   Elevated LFTs Status post cholecystectomy. Acute hepatitis panel and HIV screen negative. Appears to be secondary to viral syndrome.    Uncontrolled type 2 diabetes mellitus with hyperglycemia, without long-term current use of insulin (HCC) Hemoglobin A1c 12.5, patient will need insulin.  Recent hemoglobin A1c was 15 in January, she was started on multiple medication by her PCP, hemoglobin A1c is improving.  I will continue that regimen.  Follow-up with PCP as outpatient.   Hyponatremia Hypokalemia. Mild metabolic acidosis. Conditions Improved.   Dyslipidemia Resume statin   Depression Resume home treatment.         Consultants: ID, GI, hematology. Procedures performed: None  Disposition: Home Diet recommendation:  Discharge Diet Orders (From admission, onward)     Start     Ordered   10/16/22 0000  Diet - low sodium heart healthy        10/16/22 1045           Cardiac diet DISCHARGE MEDICATION: Allergies as of 10/16/2022   No Known Allergies      Medication List     STOP taking these medications    azithromycin 250 MG tablet Commonly known as: ZITHROMAX   benzonatate 200 MG capsule Commonly known as: TESSALON   bisacodyl 5 MG EC tablet  Commonly known as: DULCOLAX   docusate sodium 100 MG capsule Commonly known as: COLACE   Jardiance 25 MG Tabs tablet Generic drug: empagliflozin   methocarbamol 500 MG tablet Commonly known as: ROBAXIN   oxyCODONE 5 MG immediate release tablet Commonly known as: Oxy IR/ROXICODONE   predniSONE 20 MG tablet Commonly known as: DELTASONE   triamcinolone ointment 0.1 % Commonly known as: KENALOG       TAKE these  medications    glimepiride 4 MG tablet Commonly known as: AMARYL Take 4 mg by mouth daily with breakfast.   metFORMIN 500 MG 24 hr tablet Commonly known as: GLUCOPHAGE-XR Take 1,000 mg by mouth 2 (two) times daily. At lunch and at bedtime   nitroGLYCERIN 0.4 MG SL tablet Commonly known as: NITROSTAT Place 0.4 mg under the tongue every 5 (five) minutes as needed for chest pain.   pantoprazole 40 MG tablet Commonly known as: PROTONIX Take 1 tablet (40 mg total) by mouth daily for 14 days. Start taking on: October 17, 2022   pioglitazone 30 MG tablet Commonly known as: ACTOS Take 30 mg by mouth daily.   rosuvastatin 5 MG tablet Commonly known as: CRESTOR Take 5 mg by mouth at bedtime.   Semaglutide 3 MG Tabs Take 3 mg by mouth daily.   sertraline 50 MG tablet Commonly known as: ZOLOFT Take 50 mg by mouth daily.   traZODone 50 MG tablet Commonly known as: DESYREL Take 50 mg by mouth at bedtime.        Follow-up Information     Kirk Ruths, MD Follow up in 1 week(s).   Specialty: Internal Medicine Contact information: [place] [address] [phone]         Sindy Guadeloupe, MD Follow up in 1 week(s).   Specialty: Oncology Contact information: [place] [address] [phone]         Tsosie Billing, MD Follow up.   Specialty: Infectious Diseases Contact information: [place] [address] [phone]                Discharge Exam: Danley Danker Weights   10/14/22 1214 10/14/22 2031  Weight: 68 kg 70.9 kg   General exam: Appears calm and comfortable  Respiratory system: Clear to auscultation. Respiratory effort normal. Cardiovascular system: S1 & S2 heard, RRR. No JVD, murmurs, rubs, gallops or clicks. No pedal edema. Gastrointestinal system: Abdomen is nondistended, soft and nontender. No organomegaly or masses felt. Normal bowel sounds heard. Central nervous  system: Alert and oriented. No focal neurological deficits. Extremities: Symmetric 5 x 5 power. Skin: No rashes, lesions or ulcers Psychiatry: Judgement and insight appear normal. Mood & affect appropriate.    Condition at discharge: good  The results of significant diagnostics from this hospitalization (including imaging, microbiology, ancillary and laboratory) are listed below for reference.   Imaging Studies: DG Chest Portable 1 View  Result Date: 10/14/2022 CLINICAL DATA:  Body aches EXAM: PORTABLE CHEST 1 VIEW COMPARISON:  03/24/2018 FINDINGS: The heart size and mediastinal contours are within normal limits. Both lungs are clear. The visualized skeletal structures are unremarkable. IMPRESSION: No active disease. Electronically Signed   By: Ulyses Jarred M.D.   On: 10/14/2022 18:56   US Abdomen Limited RUQ (LIVER/GB)  Result Date: 10/14/2022 CLINICAL DATA:  Elevated LFTs EXAM: ULTRASOUND ABDOMEN LIMITED RIGHT UPPER QUADRANT COMPARISON:  None Available. FINDINGS: Gallbladder: Surgically rim Common bile duct: Diameter: Mm Liver: No focal lesion identified. Within  normal limits in parenchymal echogenicity. Portal vein is patent on color Doppler imaging with normal direction of blood flow towards the liver. Other: None. IMPRESSION: Status post cholecystectomy.  No acute abnormality noted. Electronically Signed   By: Inez Catalina M.D.   On: 10/14/2022 18:19    Microbiology: Results for orders placed or performed during the hospital encounter of 10/14/22  Resp panel by RT-PCR (RSV, Flu A&B, Covid) Anterior Nasal Swab     Status: None   Collection Time: 10/14/22  5:24 PM   Specimen: Anterior Nasal Swab  Result Value Ref Range Status   SARS Coronavirus 2 by RT PCR NEGATIVE NEGATIVE Final    Comment: (NOTE) SARS-CoV-2 target nucleic acids are NOT DETECTED.  The SARS-CoV-2 RNA is generally detectable in upper respiratory specimens during the acute phase of infection. The lowest concentration  of SARS-CoV-2 viral copies this assay can detect is 138 copies/mL. A negative result does not preclude SARS-Cov-2 infection and should not be used as the sole basis for treatment or other patient management decisions. A negative result may occur with  improper specimen collection/handling, submission of specimen other than nasopharyngeal swab, presence of viral mutation(s) within the areas targeted by this assay, and inadequate number of viral copies(<138 copies/mL). A negative result must be combined with clinical observations, patient history, and epidemiological information. The expected result is Negative.  Fact Sheet for Patients:  EntrepreneurPulse.com.au  Fact Sheet for Healthcare Providers:  IncredibleEmployment.be  This test is no t yet approved or cleared by the [place] FDA and  has been authorized for detection and/or diagnosis of SARS-CoV-2 by FDA under an Emergency Use Authorization (EUA). This EUA will remain  in effect (meaning this test can be used) for the duration of the COVID-19 declaration under Section 564(b)(1) of the Act, 21 U.S.C.section 360bbb-3(b)(1), unless the authorization is terminated  or revoked sooner.       Influenza A by PCR NEGATIVE NEGATIVE Final   Influenza B by PCR NEGATIVE NEGATIVE Final    Comment: (NOTE) The Xpert Xpress SARS-CoV-2/FLU/RSV plus assay is intended as an aid in the diagnosis of influenza from Nasopharyngeal swab specimens and should not be used as a sole basis for treatment. Nasal washings and aspirates are unacceptable for Xpert Xpress SARS-CoV-2/FLU/RSV testing.  Fact Sheet for Patients: EntrepreneurPulse.com.au  Fact Sheet for Healthcare Providers: IncredibleEmployment.be  This test is not yet approved or cleared by the [place] FDA and has been authorized for detection and/or diagnosis of SARS-CoV-2 by FDA under an Emergency Use  Authorization (EUA). This EUA will remain in effect (meaning this test can be used) for the duration of the COVID-19 declaration under Section 564(b)(1) of the Act, 21 U.S.C. section 360bbb-3(b)(1), unless the authorization is terminated or revoked.     Resp Syncytial Virus by PCR NEGATIVE NEGATIVE Final    Comment: (NOTE) Fact Sheet for Patients: EntrepreneurPulse.com.au  Fact Sheet for Healthcare Providers: IncredibleEmployment.be  This test is not yet approved or cleared by the [place] FDA and has been authorized for detection and/or diagnosis of SARS-CoV-2 by FDA under an Emergency Use Authorization (EUA). This EUA will remain in effect (meaning this test can be used) for the duration of the COVID-19 declaration under Section 564(b)(1) of the Act, 21 U.S.C. section 360bbb-3(b)(1), unless the authorization is terminated or revoked.  Performed at [facility], [address] [place], [place] [address]   Blood culture (routine x 2)     Status: None (Preliminary result)   Collection  Time: 10/14/22  8:01 PM   Specimen: BLOOD  Result Value Ref Range Status   Specimen Description BLOOD BLOOD RIGHT FOREARM  Final   Special Requests   Final    BOTTLES DRAWN AEROBIC AND ANAEROBIC Blood Culture results may not be optimal due to an inadequate volume of blood received in culture bottles   Culture   Final    NO GROWTH 2 DAYS Performed at [facility], [address] [place], [place] [address]    Report Status PENDING  Incomplete  Blood culture (routine x 2)     Status: None (Preliminary result)   Collection Time: 10/14/22  8:01 PM   Specimen: BLOOD  Result Value Ref Range Status   Specimen Description BLOOD RIGHT ANTECUBITAL  Final   Special Requests   Final    BOTTLES DRAWN AEROBIC AND ANAEROBIC Blood Culture results may not be optimal due to an inadequate volume of blood received in culture bottles   Culture   Final     NO GROWTH 2 DAYS Performed at [facility], [address] [place], [place] [address]    Report Status PENDING  Incomplete    Labs: CBC: Recent Labs  Lab 10/14/22 1621 10/15/22 0418 10/16/22 0328  WBC 8.8 11.2* 9.1  NEUTROABS 1.8  --   --   HGB 17.4* 15.4* 14.6  HCT 50.5* 44.2 43.4  MCV 91.2 90.2 92.3  PLT 14* 21* 32*   Basic Metabolic Panel: Recent Labs  Lab 10/14/22 1621 10/15/22 0418 10/16/22 0328  NA 129* 134* 137  K 4.0 3.3* 4.0  CL 97* 103 104  CO2 18* 21* 24  GLUCOSE 424* 170* 97  BUN 30* 16 10  CREATININE 0.75 0.37* 0.44  CALCIUM 8.3* 7.9* 8.2*  MG  --   --  2.2   Liver Function Tests: Recent Labs  Lab 10/14/22 1621 10/15/22 0418 10/16/22 0328  AST 1,073* 724* 435*  ALT 628* 485* 334*  ALKPHOS 241* 190* 161*  BILITOT 1.4* 1.1 1.0  PROT 6.4* 5.4* 5.8*  ALBUMIN 3.3* 2.8* 2.9*   CBG: Recent Labs  Lab 10/15/22 1208 10/15/22 1603 10/15/22 2022 10/15/22 2043 10/16/22 0722  GLUCAP 167* 156* 105* 98 88    Discharge time spent: greater than 30 minutes.  Signed: Sharen Hones, MD [facility] 10/16/2022

## 2022-10-16 NOTE — Care Management Important Message (Signed)
Important Message  Patient Details  Name: Tammy Brewer MRN: [id number] Date of Birth: 1956-03-22   Medicare Important Message Given:  N/A - LOS <3 / Initial given by admissions     Tammy Brewer 10/16/2022, 10:55 AM

## 2022-10-16 NOTE — Inpatient Diabetes Management (Addendum)
Inpatient Diabetes Program Recommendations  AACE/ADA: New Consensus Statement on Inpatient Glycemic Control (2015)  Target Ranges:  Prepandial:   less than 140 mg/dL      Peak postprandial:   less than 180 mg/dL (1-2 hours)      Critically ill patients:  140 - 180 mg/dL    Latest Reference Range & Units 10/14/22 19:00  Hemoglobin A1C 4.8 - 5.6 % 12.5 (H)  312 mg/dl  (H): Data is abnormally high  Latest Reference Range & Units 10/15/22 08:02 10/15/22 12:08 10/15/22 16:03 10/15/22 20:22 10/15/22 20:43  Glucose-Capillary 70 - 99 mg/dL 192 (H) 167 (H) 156 (H) 105 (H) 98  (H): Data is abnormally high  Latest Reference Range & Units 10/16/22 07:22  Glucose-Capillary 70 - 99 mg/dL 88     Admit with:  Thrombocytopathia--Low Platelets/ Elevated Hgb/Hct Elevated LFTs  Uncontrolled type 2 diabetes mellitus with hyperglycemia    History: DM2   Home DM Meds: Jardiance 25 mg daily                              Amaryl 4 mg daily                              Metformin 500 mg BID                              Actos 30 mg daily (NOT taking)                              Rybelsus 7 mg daily (Just added)   Current Orders: Novolog Resistant Correction Scale/ SSI (0-20 units) TID AC + HS      Semglee 6 units Daily       PCP: Dr. Ouida Sills with [facility] Last seen 09/04/2022 Looks like pt was off all her Diabetes meds prior to this visit??    MD- Note A1c was 15.3% at last PCP appt (Aug 28, 2022).  Hemoglobin A1C 08/28/2022 15.3 (H) (see PCP note 09/04/2022) Rybelsus added at that visit and Amaryl, Metformin, and Jardiance renewed.   Pt had not taken any meds for about 1 month prior to PCP visit (may have been longer).   A1c now down to 12.5%   Pt requesting CBG meter Rx for home as well (Order [id number])  Note CBG 88 this AM--May consider reducing the Semglee to 4 units Daily    --Will follow patient during hospitalization--  Wyn Quaker RN, MSN, [place] [facility]  Coordinator Inpatient Glycemic Control Team Team Pager: [phone] (8a-5p)

## 2022-10-19 LAB — COMP PANEL: LEUKEMIA/LYMPHOMA

## 2022-10-19 LAB — CULTURE, BLOOD (ROUTINE X 2)
Culture: NO GROWTH
Culture: NO GROWTH

## 2022-10-20 LAB — PARASITE EXAM, BLOOD

## 2022-10-22 LAB — MISC LABCORP TEST (SEND OUT)
Labcorp test code: 9985
Labcorp test code: 9985

## 2022-10-27 ENCOUNTER — Inpatient Hospital Stay: Payer: Medicare HMO | Admitting: Infectious Diseases

## 2022-10-28 ENCOUNTER — Other Ambulatory Visit: Payer: Self-pay | Admitting: *Deleted

## 2022-10-28 DIAGNOSIS — R7989 Other specified abnormal findings of blood chemistry: Secondary | ICD-10-CM

## 2022-10-28 DIAGNOSIS — D696 Thrombocytopenia, unspecified: Secondary | ICD-10-CM

## 2022-10-29 ENCOUNTER — Ambulatory Visit: Payer: Medicare HMO | Attending: Infectious Diseases | Admitting: Infectious Diseases

## 2022-10-29 DIAGNOSIS — A90 Dengue fever [classical dengue]: Secondary | ICD-10-CM | POA: Diagnosis present

## 2022-10-29 NOTE — Progress Notes (Signed)
The purpose of this virtual visit is to provide medical care while limiting exposure to the novel coronavirus (COVID19) for both patient and office staff.   Consent was obtained for phone visit:  Yes.   Answered questions that patient had about telehealth interaction:  Yes.   I discussed the limitations, risks, security and privacy concerns of performing an evaluation and management service by telephone. I also discussed with the patient that there may be a patient responsible charge related to this service. The patient expressed understanding and agreed to proceed.   Patient Location: Home Provider office Office People on the call patient, Spanish interpreter through the service, CMA and provider This visit is a follow-up to the recent hospitalization Patient was recently at [facility] for fever body ache and fatigue.  She had returned from [place] a week before that. On presentation she had very low platelets and abnormal LFTs. Labs were sent for dengue, chicken gonia, malaria. Patient was discharged home as it was thought to be a viral illness Patient states she is feeling gradually better every day She has not had a fever She says that fingertips are discolored I explained to her that her dengue PCR was positive for  type 3 and IgM, IgG antibody came positive.  Zika IgM was positive but that looks like cross-reactivity for dengue Patient has to get lab was checked for platelet count and LFTs she says she will come tomorrow. I informed her that she should come to see me to look at the fingers but she did not want to.  She says she will come if needed.  Total time spent on this call was 17 minutes

## 2022-10-30 ENCOUNTER — Other Ambulatory Visit
Admission: RE | Admit: 2022-10-30 | Discharge: 2022-10-30 | Disposition: A | Discharge status: Home / Self Care | Payer: Medicare HMO | Attending: Infectious Diseases | Admitting: Infectious Diseases

## 2022-10-30 ENCOUNTER — Inpatient Hospital Stay: Payer: Medicare HMO | Admitting: Nurse Practitioner

## 2022-10-30 ENCOUNTER — Inpatient Hospital Stay: Payer: Medicare HMO | Attending: Nurse Practitioner

## 2022-10-30 DIAGNOSIS — R509 Fever, unspecified: Secondary | ICD-10-CM | POA: Diagnosis not present

## 2022-10-30 DIAGNOSIS — D696 Thrombocytopenia, unspecified: Secondary | ICD-10-CM | POA: Insufficient documentation

## 2022-10-30 DIAGNOSIS — A90 Dengue fever [classical dengue]: Secondary | ICD-10-CM | POA: Insufficient documentation

## 2022-10-30 DIAGNOSIS — R7989 Other specified abnormal findings of blood chemistry: Secondary | ICD-10-CM | POA: Diagnosis not present

## 2022-10-30 LAB — PROTIME-INR
INR: 1 (ref 0.8–1.2)
Prothrombin Time: 12.8 seconds (ref 11.4–15.2)

## 2022-10-30 LAB — CBC WITH DIFFERENTIAL/PLATELET
Abs Immature Granulocytes: 0.02 10*3/uL (ref 0.00–0.07)
Basophils Absolute: 0 10*3/uL (ref 0.0–0.1)
Basophils Relative: 1 %
Eosinophils Absolute: 0.3 10*3/uL (ref 0.0–0.5)
Eosinophils Relative: 5 %
HCT: 40.1 % (ref 36.0–46.0)
Hemoglobin: 13.2 g/dL (ref 12.0–15.0)
Immature Granulocytes: 0 %
Lymphocytes Relative: 42 %
Lymphs Abs: 2.1 10*3/uL (ref 0.7–4.0)
MCH: 30.9 pg (ref 26.0–34.0)
MCHC: 32.9 g/dL (ref 30.0–36.0)
MCV: 93.9 fL (ref 80.0–100.0)
Monocytes Absolute: 0.5 10*3/uL (ref 0.1–1.0)
Monocytes Relative: 10 %
Neutro Abs: 2.1 10*3/uL (ref 1.7–7.7)
Neutrophils Relative %: 42 %
Platelets: 268 10*3/uL (ref 150–400)
RBC: 4.27 MIL/uL (ref 3.87–5.11)
RDW: 12.3 % (ref 11.5–15.5)
WBC: 5 10*3/uL (ref 4.0–10.5)
nRBC: 0 % (ref 0.0–0.2)

## 2022-10-30 LAB — COMPREHENSIVE METABOLIC PANEL
ALT: 46 U/L — ABNORMAL HIGH (ref 0–44)
AST: 33 U/L (ref 15–41)
Albumin: 3.7 g/dL (ref 3.5–5.0)
Alkaline Phosphatase: 109 U/L (ref 38–126)
Anion gap: 8 (ref 5–15)
BUN: 9 mg/dL (ref 8–23)
CO2: 27 mmol/L (ref 22–32)
Calcium: 9.5 mg/dL (ref 8.9–10.3)
Chloride: 104 mmol/L (ref 98–111)
Creatinine, Ser: 0.4 mg/dL — ABNORMAL LOW (ref 0.44–1.00)
GFR, Estimated: >60 mL/min (ref >60)
Glucose, Bld: 179 mg/dL — ABNORMAL HIGH (ref 70–99)
Potassium: 3.6 mmol/L (ref 3.5–5.1)
Sodium: 139 mmol/L (ref 135–145)
Total Bilirubin: 1.2 mg/dL (ref 0.3–1.2)
Total Protein: 7 g/dL (ref 6.5–8.1)

## 2022-10-30 LAB — APTT: aPTT: 26 seconds (ref 24–36)

## 2022-11-05 ENCOUNTER — Inpatient Hospital Stay: Payer: Medicare HMO | Admitting: Infectious Diseases

## 2023-06-09 ENCOUNTER — Other Ambulatory Visit: Payer: Self-pay | Admitting: Internal Medicine

## 2023-06-09 DIAGNOSIS — Z1231 Encounter for screening mammogram for malignant neoplasm of breast: Secondary | ICD-10-CM
